# Patient Record
Sex: Female | Born: 1985
Health system: Southern US, Community
[De-identification: ages and names within clinical notes are randomized; demographics above are authoritative.]

## PROBLEM LIST (undated history)

## (undated) ENCOUNTER — Inpatient Hospital Stay (HOSPITAL_COMMUNITY): Payer: Self-pay

## (undated) DIAGNOSIS — Z8719 Personal history of other diseases of the digestive system: Secondary | ICD-10-CM

## (undated) DIAGNOSIS — N390 Urinary tract infection, site not specified: Secondary | ICD-10-CM

## (undated) DIAGNOSIS — Z634 Disappearance and death of family member: Secondary | ICD-10-CM

## (undated) DIAGNOSIS — M419 Scoliosis, unspecified: Secondary | ICD-10-CM

## (undated) DIAGNOSIS — K219 Gastro-esophageal reflux disease without esophagitis: Secondary | ICD-10-CM

## (undated) DIAGNOSIS — T7840XA Allergy, unspecified, initial encounter: Secondary | ICD-10-CM

## (undated) DIAGNOSIS — M1990 Unspecified osteoarthritis, unspecified site: Secondary | ICD-10-CM

## (undated) DIAGNOSIS — R51 Headache: Secondary | ICD-10-CM

## (undated) DIAGNOSIS — R55 Syncope and collapse: Secondary | ICD-10-CM

## (undated) DIAGNOSIS — G709 Myoneural disorder, unspecified: Secondary | ICD-10-CM

## (undated) DIAGNOSIS — I951 Orthostatic hypotension: Secondary | ICD-10-CM

## (undated) HISTORY — DX: Myoneural disorder, unspecified: G70.9

## (undated) HISTORY — DX: Syncope and collapse: R55

## (undated) HISTORY — DX: Orthostatic hypotension: I95.1

## (undated) HISTORY — DX: Unspecified osteoarthritis, unspecified site: M19.90

## (undated) HISTORY — DX: Allergy, unspecified, initial encounter: T78.40XA

## (undated) HISTORY — DX: Scoliosis, unspecified: M41.9

---
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
## 2007-01-09 ENCOUNTER — Emergency Department (HOSPITAL_COMMUNITY): Admission: EM | Admit: 2007-01-09 | Discharge: 2007-01-09 | Payer: Self-pay | Admitting: Family Medicine

## 2009-04-20 ENCOUNTER — Emergency Department (HOSPITAL_COMMUNITY): Admission: EM | Admit: 2009-04-20 | Discharge: 2009-04-20 | Payer: Self-pay | Admitting: Emergency Medicine

## 2009-11-01 ENCOUNTER — Emergency Department (HOSPITAL_COMMUNITY): Admission: EM | Admit: 2009-11-01 | Discharge: 2009-11-01 | Payer: Self-pay | Admitting: Family Medicine

## 2009-12-07 ENCOUNTER — Emergency Department (HOSPITAL_COMMUNITY): Admission: EM | Admit: 2009-12-07 | Discharge: 2009-12-07 | Payer: Self-pay | Admitting: Family Medicine

## 2010-01-07 ENCOUNTER — Emergency Department (HOSPITAL_COMMUNITY): Admission: EM | Admit: 2010-01-07 | Discharge: 2010-01-07 | Payer: Self-pay | Admitting: Emergency Medicine

## 2010-01-07 ENCOUNTER — Emergency Department (HOSPITAL_COMMUNITY): Admission: EM | Admit: 2010-01-07 | Discharge: 2010-01-08 | Payer: Self-pay | Admitting: Emergency Medicine

## 2010-01-08 ENCOUNTER — Ambulatory Visit (HOSPITAL_COMMUNITY): Admission: RE | Admit: 2010-01-08 | Discharge: 2010-01-08 | Payer: Self-pay | Admitting: Otolaryngology

## 2010-03-02 ENCOUNTER — Emergency Department (HOSPITAL_COMMUNITY): Admission: EM | Admit: 2010-03-02 | Discharge: 2010-03-02 | Payer: Self-pay | Admitting: Family Medicine

## 2010-07-01 ENCOUNTER — Emergency Department (HOSPITAL_COMMUNITY)
Admission: EM | Admit: 2010-07-01 | Discharge: 2010-07-01 | Payer: Self-pay | Source: Home / Self Care | Admitting: Emergency Medicine

## 2010-07-05 HISTORY — PX: NOSE SURGERY: SHX723

## 2010-09-14 LAB — POCT URINALYSIS DIPSTICK
Bilirubin Urine: NEGATIVE
Glucose, UA: NEGATIVE mg/dL
Ketones, ur: NEGATIVE mg/dL
Nitrite: NEGATIVE
Protein, ur: 100 mg/dL — AB
Specific Gravity, Urine: 1.02 (ref 1.005–1.030)
Urobilinogen, UA: 0.2 mg/dL (ref 0.0–1.0)
pH: 8.5 — ABNORMAL HIGH (ref 5.0–8.0)

## 2010-09-14 LAB — URINE CULTURE

## 2010-09-14 LAB — POCT PREGNANCY, URINE: Preg Test, Ur: NEGATIVE

## 2010-09-17 LAB — POCT URINALYSIS DIPSTICK
Protein, ur: >=300 mg/dL — AB
Specific Gravity, Urine: >=1.03 (ref 1.005–1.030)
Urobilinogen, UA: 0.2 mg/dL (ref 0.0–1.0)

## 2010-09-17 LAB — GC/CHLAMYDIA PROBE AMP, GENITAL: Chlamydia, DNA Probe: NEGATIVE

## 2010-09-17 LAB — WET PREP, GENITAL
Trich, Wet Prep: NONE SEEN
Yeast Wet Prep HPF POC: NONE SEEN

## 2010-09-20 LAB — CBC
HCT: 40.5 % (ref 36.0–46.0)
MCHC: 32.9 g/dL (ref 30.0–36.0)
MCV: 80.8 fL (ref 78.0–100.0)
RDW: 13.2 % (ref 11.5–15.5)

## 2010-09-21 LAB — POCT URINALYSIS DIP (DEVICE)
Ketones, ur: NEGATIVE mg/dL
Protein, ur: 30 mg/dL — AB
Specific Gravity, Urine: 1.025 (ref 1.005–1.030)
pH: 5.5 (ref 5.0–8.0)

## 2010-09-21 LAB — GC/CHLAMYDIA PROBE AMP, GENITAL
Chlamydia, DNA Probe: NEGATIVE
GC Probe Amp, Genital: NEGATIVE

## 2010-09-21 LAB — URINE CULTURE: Colony Count: 75000

## 2010-09-21 LAB — WET PREP, GENITAL

## 2010-10-08 LAB — CULTURE, ROUTINE-ABSCESS

## 2010-11-08 ENCOUNTER — Emergency Department (HOSPITAL_COMMUNITY)
Admission: EM | Admit: 2010-11-08 | Discharge: 2010-11-08 | Disposition: A | Discharge status: Home / Self Care | Payer: Self-pay | Attending: Emergency Medicine | Admitting: Emergency Medicine

## 2010-11-08 DIAGNOSIS — N898 Other specified noninflammatory disorders of vagina: Secondary | ICD-10-CM | POA: Insufficient documentation

## 2010-11-08 DIAGNOSIS — R339 Retention of urine, unspecified: Secondary | ICD-10-CM | POA: Insufficient documentation

## 2010-11-08 DIAGNOSIS — M549 Dorsalgia, unspecified: Secondary | ICD-10-CM | POA: Insufficient documentation

## 2010-11-08 DIAGNOSIS — Z87440 Personal history of urinary (tract) infections: Secondary | ICD-10-CM | POA: Insufficient documentation

## 2010-11-08 DIAGNOSIS — K219 Gastro-esophageal reflux disease without esophagitis: Secondary | ICD-10-CM | POA: Insufficient documentation

## 2010-11-08 DIAGNOSIS — R109 Unspecified abdominal pain: Secondary | ICD-10-CM | POA: Insufficient documentation

## 2010-11-08 DIAGNOSIS — R350 Frequency of micturition: Secondary | ICD-10-CM | POA: Insufficient documentation

## 2010-11-08 DIAGNOSIS — R3915 Urgency of urination: Secondary | ICD-10-CM | POA: Insufficient documentation

## 2010-11-08 LAB — URINALYSIS, ROUTINE W REFLEX MICROSCOPIC
Bilirubin Urine: NEGATIVE
Ketones, ur: NEGATIVE mg/dL
Nitrite: NEGATIVE
pH: 7 (ref 5.0–8.0)

## 2010-11-08 LAB — WET PREP, GENITAL
Clue Cells Wet Prep HPF POC: NONE SEEN
Trich, Wet Prep: NONE SEEN

## 2010-11-08 LAB — POCT PREGNANCY, URINE: Preg Test, Ur: NEGATIVE

## 2010-11-09 LAB — GC/CHLAMYDIA PROBE AMP, GENITAL
Chlamydia, DNA Probe: NEGATIVE
GC Probe Amp, Genital: NEGATIVE

## 2010-11-20 NOTE — H&P (Signed)
Elizabeth Burke             ACCOUNT NO.:  [id number]   MEDICAL RECORD NO.:  [id number]          PATIENT TYPE:  INP   LOCATION:                                FACILITY:  [facility]   PHYSICIAN:  Elizabeth Burke, M.D.   DATE OF BIRTH:  1986-04-14   DATE OF ADMISSION:  DATE OF DISCHARGE:                                HISTORY & PHYSICAL   HISTORY OF PRESENT ILLNESS:  This a 25 year old gravida 1 para 0 0 0 0 at 46-  1/7 weeks who presents to the office for evaluation of labor.  Upon exam her  cervix is found to be 6 cm dilated, completely effaced, minus 1 station,  with a vertex presentation and decision is made to send her to birthing  suite for delivery.  She denies leaking or bleeding, reports positive fetal  movement.  Pregnancy has been followed by Elizabeth Burke and remarkable for:  1. GERD.  2. Teen.  3. Multiple STD history.  4. Group B strep bacteruria.   PRENATAL LABS:  Hemoglobin 12.3, platelets 299, blood type B positive,  antibody screen negative, sickle cell negative, RPR was nonreactive, Rubella  immune, hepatitis negative, HIV negative, Aptest normal.  Initial GC and  chlamydia were positive, test of cure after treatment were both negative.   IV HISTORY:  The patient is primigravida.   ALLERGIES:  None.   MEDICAL HISTORY:  Remarkable for first trimester STDs and subsequent other  episodes, history of acid reflux, history of UTI in 2006, history of broken  wrist as a child.   FAMILY HISTORY:  Family history is remarkable for grandmother with  hypertension and varicosities and diabetes, aunt with hypothyroidism,  grandfather with prostate cancer, uncle with prostate cancer.   SURGICAL HISTORY:  Negative.   GENETIC HISTORY:  Remarkable for uncle and cousin with twins.  Father of the  baby's info in  unknown.   SOCIAL HISTORY:  The patient is single, father of the baby is intermittently  involved (questionable).  The patient's mother is with her today.  The  patient  is a Consulting civil engineer.  She does not report a religious affiliation.  She  denies any alcohol, tobacco or drug use.   HISTORY OF CURRENT PREGNANCY:  The patient entered care at [redacted] weeks  gestation.  Test of cure was done following treatment for first trimester GC  and chlamydia and test of cure was negative.  The patient was found to be a  cystic fibrosis carrier and was supposed to notify the father of the baby to  be tested.  She had an ultrasound at 20 weeks which was normal.  She  measured size greater than dates at 27 weeks, had another ultrasound at 28  weeks, measured 69th percentile with normal AFI.  Gluco level was normal.  Father of the baby was never tested for cystic fibrosis, and then she  presents for labor.   OBJECTIVE DATA:  Vital signs stable, afebrile.  HEENT:  Within normal limits.  Thyroid normal, not enlarged.  CHEST:  Clear to auscultation.  HEART:  Regular rate and rhythm.  ABDOMEN:  Gravid, 39 cm, vertex.  Fetal heart rate was 150 BPM.  Cervix is 6  cm, completely effaced, minus 1 station with a vertex presentation, no  leaking of membranes is noted.  EXTREMITIES:  Within normal limits.   ASSESSMENT:  1. Intrauterine pregnancy at 39-1/7 weeks.  2. Active labor.  3. Group B strep positive.   PLAN:  1. Admit to birthing suites per Dr. Su Burke.  2. Routine MD orders and further notes to follow.      Elizabeth Burke, C.N.M.      Elizabeth Burke, M.D.  Electronically Signed    MLW/MEDQ  D:  03/01/2006  T:  03/01/2006  Job:  [id number]

## 2011-01-19 ENCOUNTER — Inpatient Hospital Stay (INDEPENDENT_AMBULATORY_CARE_PROVIDER_SITE_OTHER)
Admission: RE | Admit: 2011-01-19 | Discharge: 2011-01-19 | Disposition: A | Discharge status: Home / Self Care | Payer: 59 | Source: Ambulatory Visit | Attending: Family Medicine | Admitting: Family Medicine

## 2011-01-19 DIAGNOSIS — N760 Acute vaginitis: Secondary | ICD-10-CM

## 2011-01-19 DIAGNOSIS — L738 Other specified follicular disorders: Secondary | ICD-10-CM

## 2011-01-19 LAB — POCT URINALYSIS DIP (DEVICE)
Glucose, UA: NEGATIVE mg/dL
Ketones, ur: NEGATIVE mg/dL
Leukocytes, UA: NEGATIVE
Nitrite: NEGATIVE
Nitrite: NEGATIVE
Protein, ur: 30 mg/dL — AB
Urobilinogen, UA: 0.2 mg/dL (ref 0.0–1.0)
Urobilinogen, UA: 0.2 mg/dL (ref 0.0–1.0)

## 2011-01-19 LAB — WET PREP, GENITAL: Yeast Wet Prep HPF POC: NONE SEEN

## 2011-01-19 LAB — POCT PREGNANCY, URINE: Preg Test, Ur: NEGATIVE

## 2011-01-20 LAB — HSV 2 ANTIBODY, IGG: HSV 2 Glycoprotein G Ab, IgG: 0.12 IV

## 2011-04-20 LAB — GC/CHLAMYDIA PROBE AMP, GENITAL: GC Probe Amp, Genital: NEGATIVE

## 2011-04-22 LAB — POCT RAPID STREP A: Streptococcus, Group A Screen (Direct): NEGATIVE

## 2011-04-22 LAB — POCT INFECTIOUS MONO SCREEN: Mono Screen: NEGATIVE

## 2011-06-30 ENCOUNTER — Emergency Department (HOSPITAL_COMMUNITY)
Admission: EM | Admit: 2011-06-30 | Discharge: 2011-06-30 | Disposition: A | Discharge status: Home / Self Care | Payer: 59 | Source: Home / Self Care | Attending: Family Medicine | Admitting: Family Medicine

## 2011-06-30 ENCOUNTER — Encounter: Payer: Self-pay | Admitting: Emergency Medicine

## 2011-06-30 DIAGNOSIS — J111 Influenza due to unidentified influenza virus with other respiratory manifestations: Secondary | ICD-10-CM

## 2011-06-30 NOTE — ED Provider Notes (Signed)
Medical screening examination/treatment/procedure(s) were performed by non-physician practitioner and as supervising physician I was immediately available for consultation/collaboration.   Barkley Bruns MD.    Barkley Bruns, MD 06/30/11 2101

## 2011-06-30 NOTE — ED Provider Notes (Signed)
25 year old woman with one day of fever nausea chills headache bodyaches sore throat without dyspnea. She did not get a flu shot this year she has positive sick contacts. She hasn't tried any medications yet.  PMH reviewed.  ROS as above otherwise neg Medications reviewed. (none)  Exam:  BP 114/80  Pulse 104  Temp(Src) 100.3 F (37.9 C) (Oral)  Resp 18  SpO2 97%  LMP 06/03/2011 Gen: Well NAD HEENT: EOMI,  MMM, normal posterior pharynx Lungs: CTABL Nl WOB Heart: RRR no MRG Abd: NABS, NT, ND Exts: Non edematous BL  LE, warm and well perfused.   Assessment and plan: 25 year old woman with influenza-like illness. Plan supportive therapy with Tylenol or ibuprofen. Handout and influenza given and red flags reviewed with patient who expresses understanding.   Clementeen Graham 06/30/11 1901

## 2011-06-30 NOTE — ED Notes (Signed)
PT HERE WITH SUDDEN ONSET FLU LIKE SX WITH FEVER THAT STARTED TODAY.AT HOME TEMP 102.5 RELIEVED BY TYLENOL AND SX BODY ACHES,CHILLS AND NAUSEA.NO VOMITING OR DIARRHEA REPORTED WITH SX

## 2011-09-09 ENCOUNTER — Encounter (HOSPITAL_COMMUNITY): Payer: Self-pay

## 2011-09-09 ENCOUNTER — Emergency Department (INDEPENDENT_AMBULATORY_CARE_PROVIDER_SITE_OTHER)
Admission: EM | Admit: 2011-09-09 | Discharge: 2011-09-09 | Disposition: A | Discharge status: Home / Self Care | Payer: 59 | Source: Home / Self Care

## 2011-09-09 DIAGNOSIS — S46911A Strain of unspecified muscle, fascia and tendon at shoulder and upper arm level, right arm, initial encounter: Secondary | ICD-10-CM

## 2011-09-09 DIAGNOSIS — IMO0002 Reserved for concepts with insufficient information to code with codable children: Secondary | ICD-10-CM

## 2011-09-09 MED ORDER — IBUPROFEN 800 MG PO TABS: 800.0000 mg | ORAL_TABLET | Freq: Three times a day (TID) | ORAL | Status: AC

## 2011-09-09 NOTE — Discharge Instructions (Signed)
Ice your shoulder 3-4 times times a day. If your shoulder pain is not improving on Monday follow up with Dr Renae Gloss.

## 2011-09-09 NOTE — ED Provider Notes (Signed)
History     CSN: [id number]  Arrival date & time 09/09/11  0949   None     Chief Complaint  Patient presents with  . Shoulder Pain    (Consider location/radiation/quality/duration/timing/severity/associated sxs/prior treatment) HPI Comments: Elizabeth Burke presents today with complaints of right shoulder pain for one week. She states her pain significantly worsened yesterday. Pain worsens with movement of her shoulder. She denies injury, but states that she does lift heavy boxes at work. She has had intermittent pain in bilateral shoulders off and on for a few months but this has been mild and never this severe. She has tried Tylenol for her pain without improvement.   History reviewed. No pertinent past medical history.  Past Surgical History  Procedure Date  . Nose surgery     History reviewed. No pertinent family history.  History  Substance Use Topics  . Smoking status: Current Everyday Smoker -- 1.0 packs/day  . Smokeless tobacco: Not on file  . Alcohol Use: No    OB History    Grav Para Term Preterm Abortions TAB SAB Ect Mult Living                  Review of Systems  Constitutional: Negative for fever.  Musculoskeletal: Negative for joint swelling.  Skin: Negative for color change.  Neurological: Negative for weakness and numbness.    Allergies  Review of patient's allergies indicates no known allergies.  Home Medications   Current Outpatient Rx  Name Route Sig Dispense Refill  . IBUPROFEN 800 MG PO TABS Oral Take 1 tablet (800 mg total) by mouth 3 (three) times daily. 15 tablet 0    BP 136/90  Pulse 70  Temp(Src) 97.7 F (36.5 C) (Oral)  Resp 16  SpO2 100%  LMP 08/19/2011  Physical Exam  Nursing note and vitals reviewed. Constitutional: She appears well-developed and well-nourished. No distress.  Cardiovascular:  Pulses:      Radial pulses are 2+ on the right side.  Musculoskeletal:       Right shoulder: She exhibits decreased range of motion,  tenderness and decreased strength. She exhibits no bony tenderness, no swelling, no effusion, no crepitus, no deformity and normal pulse.       Rt Shoulder: Decreased active abduction at 45. Decreased passive abduction at 90. Tender to palpation the superior half of the deltoid muscle. Remainder of shoulder is nontender to palpation.  Neurological: She is alert.  Skin: Skin is warm and dry. No erythema.  Psychiatric: She has a normal mood and affect.    ED Course  Procedures (including critical care time)  Labs Reviewed - No data to display No results found.   1. Right shoulder strain       MDM  Rt deltoid strain. Treatment with ice and Ibuprofen. F/u with PCP if symptoms are not improving next week.        Melody Comas, [place] 09/09/11 1232

## 2011-09-09 NOTE — ED Notes (Signed)
C/o pain in rt shoulder for 1 week, pain became severe yesterday.  Pain is worse with movement.  Denies known injury

## 2011-09-10 NOTE — ED Provider Notes (Signed)
Medical screening examination/treatment/procedure(s) were performed by non-physician practitioner and as supervising physician I was immediately available for consultation/collaboration.  Leslee Home, M.D.   Reuben Likes, MD 09/10/11 1345

## 2011-10-16 ENCOUNTER — Emergency Department (INDEPENDENT_AMBULATORY_CARE_PROVIDER_SITE_OTHER)
Admission: EM | Admit: 2011-10-16 | Discharge: 2011-10-16 | Disposition: A | Discharge status: Home / Self Care | Payer: 59 | Source: Home / Self Care

## 2011-10-16 ENCOUNTER — Encounter (HOSPITAL_COMMUNITY): Payer: Self-pay | Admitting: Emergency Medicine

## 2011-10-16 DIAGNOSIS — N3090 Cystitis, unspecified without hematuria: Secondary | ICD-10-CM

## 2011-10-16 HISTORY — DX: Urinary tract infection, site not specified: N39.0

## 2011-10-16 LAB — POCT URINALYSIS DIP (DEVICE)
Glucose, UA: NEGATIVE mg/dL
Ketones, ur: NEGATIVE mg/dL
Specific Gravity, Urine: 1.02 (ref 1.005–1.030)
Urobilinogen, UA: 0.2 mg/dL (ref 0.0–1.0)

## 2011-10-16 LAB — POCT PREGNANCY, URINE: Preg Test, Ur: NEGATIVE

## 2011-10-16 MED ORDER — CEPHALEXIN 500 MG PO CAPS: 500.0000 mg | ORAL_CAPSULE | Freq: Two times a day (BID) | ORAL | Status: AC

## 2011-10-16 NOTE — ED Provider Notes (Signed)
History     CSN: [id number]  Arrival date & time 10/16/11  1041   None     Chief Complaint  Patient presents with  . Hematuria    (Consider location/radiation/quality/duration/timing/severity/associated sxs/prior treatment) HPI Comments: Patient presents today with complaints of and hematuria, urinary frequency and dysuria at the end of stream. Symptoms began this morning. She admits to mild suprapubic tenderness. She has chronic back pain, and nausea unchanged. No vomiting. She denies fever. She is a history of urinary tract infections, and states she was last treated for one couple of months ago.   Past Medical History  Diagnosis Date  . Urinary tract infection     Past Surgical History  Procedure Date  . Nose surgery     History reviewed. No pertinent family history.  History  Substance Use Topics  . Smoking status: Current Everyday Smoker -- 1.0 packs/day  . Smokeless tobacco: Not on file  . Alcohol Use: Yes    OB History    Grav Para Term Preterm Abortions TAB SAB Ect Mult Living                  Review of Systems  Constitutional: Negative for fever and chills.  Gastrointestinal: Positive for abdominal pain. Negative for nausea and vomiting.  Genitourinary: Positive for dysuria, frequency and hematuria. Negative for flank pain.    Allergies  Review of patient's allergies indicates no known allergies.  Home Medications   Current Outpatient Rx  Name Route Sig Dispense Refill  . CEPHALEXIN 500 MG PO CAPS Oral Take 1 capsule (500 mg total) by mouth 2 (two) times daily. 14 capsule 0    BP 117/75  Pulse 85  Temp(Src) 98.2 F (36.8 C) (Oral)  Resp 17  SpO2 99%  LMP 09/27/2011  Physical Exam  Nursing note and vitals reviewed. Constitutional: She appears well-developed and well-nourished. No distress.  HENT:  Head: Normocephalic and atraumatic.  Cardiovascular: Normal rate, regular rhythm and normal heart sounds.   Pulmonary/Chest: Effort normal  and breath sounds normal. No respiratory distress.  Abdominal: Soft. Normal appearance and bowel sounds are normal. She exhibits no distension and no mass. There is no hepatosplenomegaly. There is tenderness in the suprapubic area. There is no guarding and no CVA tenderness.  Neurological: She is alert.  Skin: Skin is warm and dry.  Psychiatric: She has a normal mood and affect.    ED Course  Procedures (including critical care time)  Labs Reviewed  POCT URINALYSIS DIP (DEVICE) - Abnormal; Notable for the following:    Hgb urine dipstick LARGE (*)    pH 8.5 (*)    Protein, ur 100 (*)    Leukocytes, UA MODERATE (*) Biochemical Testing Only. Please order routine urinalysis from main lab if confirmatory testing is needed.   All other components within normal limits  POCT PREGNANCY, URINE   No results found.   1. Cystitis       MDM  UA pos for UTI.         Melody Comas, [place] 10/16/11 1323

## 2011-10-16 NOTE — ED Provider Notes (Signed)
Medical screening examination/treatment/procedure(s) were performed by non-physician practitioner and as supervising physician I was immediately available for consultation/collaboration.  Leslee Home, M.D.   Reuben Likes, MD 10/16/11 2036

## 2011-10-16 NOTE — ED Notes (Signed)
Noticed blood in urine this am.  Noticed odd sensation with urination yesterday. Today has frequent urination, pain with urination, particularly at the end of the stream.  Low abdominal pain onset last week.  Back  Pain is chronic, no difference from usual.

## 2011-10-16 NOTE — Discharge Instructions (Signed)
Increase fluids. Water and cranberry juice are good. Take antibiotics as prescribed. Return if symptoms change or worsen, or if no improvement in 2-3 days.  Urinary Tract Infection A urinary tract infection (UTI) is often caused by a germ (bacteria). A UTI is usually helped with medicine (antibiotics) that kills germs. Take all the medicine until it is gone. Do this even if you are feeling better. You are usually better in 7 to 10 days. HOME CARE   Drink enough water and fluids to keep your pee (urine) clear or pale yellow. Drink:   Cranberry juice.   Water.   Avoid:   Caffeine.   Tea.   Bubbly (carbonated) drinks.   Alcohol.   Only take medicine as told by your doctor.   To prevent further infections:   Pee often.   After pooping (bowel movement), women should wipe from front to back. Use each tissue only once.   Pee before and after having sex (intercourse).  Ask your doctor when your test results will be ready. Make sure you follow up and get your test results.  GET HELP RIGHT AWAY IF:   There is very bad back pain or lower belly (abdominal) pain.   You get the chills.   You have a fever.   Your baby is older than 3 months with a rectal temperature of 102 F (38.9 C) or higher.   Your baby is 5 months old or younger with a rectal temperature of 100.4 F (38 C) or higher.   You feel sick to your stomach (nauseous) or throw up (vomit).   There is continued burning with peeing.   Your problems are not better in 3 days. Return sooner if you are getting worse.  MAKE SURE YOU:   Understand these instructions.   Will watch your condition.   Will get help right away if you are not doing well or get worse.  Document Released: 12/08/2007 Document Revised: 06/10/2011 Document Reviewed: 12/08/2007 [employer] Patient Information 2012 [place], [place].

## 2011-11-10 ENCOUNTER — Emergency Department (HOSPITAL_COMMUNITY)
Admission: EM | Admit: 2011-11-10 | Discharge: 2011-11-10 | Disposition: A | Discharge status: Home / Self Care | Payer: 59 | Attending: Emergency Medicine | Admitting: Emergency Medicine

## 2011-11-10 ENCOUNTER — Encounter (HOSPITAL_COMMUNITY): Payer: Self-pay | Admitting: *Deleted

## 2011-11-10 DIAGNOSIS — F17200 Nicotine dependence, unspecified, uncomplicated: Secondary | ICD-10-CM | POA: Insufficient documentation

## 2011-11-10 DIAGNOSIS — R42 Dizziness and giddiness: Secondary | ICD-10-CM | POA: Insufficient documentation

## 2011-11-10 DIAGNOSIS — R404 Transient alteration of awareness: Secondary | ICD-10-CM | POA: Insufficient documentation

## 2011-11-10 DIAGNOSIS — E869 Volume depletion, unspecified: Secondary | ICD-10-CM | POA: Insufficient documentation

## 2011-11-10 DIAGNOSIS — R55 Syncope and collapse: Secondary | ICD-10-CM

## 2011-11-10 LAB — POCT I-STAT, CHEM 8
Calcium, Ion: 1.15 mmol/L (ref 1.12–1.32)
Chloride: 105 mEq/L (ref 96–112)
HCT: 40 % (ref 36.0–46.0)
Hemoglobin: 13.6 g/dL (ref 12.0–15.0)
Potassium: 3.8 mEq/L (ref 3.5–5.1)

## 2011-11-10 LAB — PREGNANCY, URINE: Preg Test, Ur: NEGATIVE

## 2011-11-10 MED ORDER — SODIUM CHLORIDE 0.9 % IV BOLUS (SEPSIS)
1000.0000 mL | Freq: Once | INTRAVENOUS | Status: AC
  Administered 2011-11-10: 1000 mL via INTRAVENOUS

## 2011-11-10 MED ORDER — MORPHINE SULFATE 4 MG/ML IJ SOLN
6.0000 mg | Freq: Once | INTRAMUSCULAR | Status: AC
  Administered 2011-11-10: 6 mg via INTRAVENOUS
  Filled 2011-11-10: qty 2

## 2011-11-10 NOTE — ED Provider Notes (Signed)
History     CSN: [id number]  Arrival date & time 11/10/11  1836   First MD Initiated Contact with Patient 11/10/11 1959      Chief Complaint  Patient presents with  . Loss of Consciousness    (Consider location/radiation/quality/duration/timing/severity/associated sxs/prior treatment) The history is provided by the patient.   the patient reports she's been lightheaded when she stands up over the past several days.  She's been feeling generally weak.  She denies chest pain shortness of breath.  She's had no palpitations.  She denies heavy vaginal bleeding.  She's had no melena or hematochezia.  She denies hematemesis.  She denies fevers and chills.  She's had no abdominal pain.  She reports she was lying down with her child and then the next thing she knew him as was her.  She had no urinary incontinence.  She had no tongue biting.  There is no seizure activity reported.  She has no past medical history.  No history of seizures.  She does smoke cigarettes and uses marijuana  Past Medical History  Diagnosis Date  . Urinary tract infection     Past Surgical History  Procedure Date  . Nose surgery     History reviewed. No pertinent family history.  History  Substance Use Topics  . Smoking status: Current Everyday Smoker -- 1.0 packs/day  . Smokeless tobacco: Not on file  . Alcohol Use: Yes    OB History    Grav Para Term Preterm Abortions TAB SAB Ect Mult Living                  Review of Systems  All other systems reviewed and are negative.    Allergies  Review of patient's allergies indicates no known allergies.  Home Medications  No current outpatient prescriptions on file.  BP 130/71  Pulse 68  Temp(Src) 98.9 F (37.2 C) (Oral)  Resp 12  SpO2 100%  LMP 10/17/2011  Physical Exam  Nursing note and vitals reviewed. Constitutional: She is oriented to person, place, and time. She appears well-developed and well-nourished. No distress.  HENT:  Head:  Normocephalic and atraumatic.       Mucous membranes dry  Eyes: EOM are normal.  Neck: Normal range of motion.  Cardiovascular: Normal rate, regular rhythm and normal heart sounds.   Pulmonary/Chest: Effort normal and breath sounds normal.  Abdominal: Soft. She exhibits no distension. There is no tenderness.  Musculoskeletal: Normal range of motion.  Neurological: She is alert and oriented to person, place, and time.  Skin: Skin is warm and dry.  Psychiatric: She has a normal mood and affect. Judgment normal.    ED Course  Procedures (including critical care time)   Date: 11/10/2011  Rate: 71  Rhythm: normal sinus rhythm  QRS Axis: normal  Intervals: normal  ST/T Wave abnormalities: early repolarization  Conduction Disutrbances: none  Narrative Interpretation:   Old EKG Reviewed: No prior ecg available     Labs Reviewed  PREGNANCY, URINE  POCT I-STAT, CHEM 8   No results found.   1. Syncope   2. Volume depletion       MDM  The patient feels much better after IV fluids.  Her EKG and labs are normal.  Unclear etiology.  This does not sound like a seizure.  She did have the appearance of volume depletion on arrival.  Her hemoglobin is normal.         Lyanne Co, MD 11/10/11 [phone]

## 2011-11-10 NOTE — ED Notes (Signed)
[id number]<AV> Expected date:<BR> Expected time: 6:33 PM<BR> Means of arrival:<BR> Comments:<BR> M70 - 25yoF Syncope, now CAOx4

## 2011-11-10 NOTE — ED Notes (Signed)
Pt was found laying down on a "bean bag in room" Pt thought she was getting up to BR. Pt was unresponsive upon EMS arrival. Pt now c/o HA.

## 2011-11-10 NOTE — Discharge Instructions (Signed)
Dehydration, Adult Dehydration is when you lose more fluids from the body than you take in. Vital organs like the kidneys, brain, and heart cannot function without a proper amount of fluids and salt. Any loss of fluids from the body can cause dehydration.  CAUSES   Vomiting.   Diarrhea.   Excessive sweating.   Excessive urine output.   Fever.  SYMPTOMS  Mild dehydration  Thirst.   Dry lips.   Slightly dry mouth.  Moderate dehydration  Very dry mouth.   Sunken eyes.   Skin does not bounce back quickly when lightly pinched and released.   Dark urine and decreased urine production.   Decreased tear production.   Headache.  Severe dehydration  Very dry mouth.   Extreme thirst.   Rapid, weak pulse (more than 100 beats per minute at rest).   Cold hands and feet.   Not able to sweat in spite of heat and temperature.   Rapid breathing.   Blue lips.   Confusion and lethargy.   Difficulty being awakened.   Minimal urine production.   No tears.  DIAGNOSIS  Your caregiver will diagnose dehydration based on your symptoms and your exam. Blood and urine tests will help confirm the diagnosis. The diagnostic evaluation should also identify the cause of dehydration. TREATMENT  Treatment of mild or moderate dehydration can often be done at home by increasing the amount of fluids that you drink. It is best to drink small amounts of fluid more often. Drinking too much at one time can make vomiting worse. Refer to the home care instructions below. Severe dehydration needs to be treated at the hospital where you will probably be given intravenous (IV) fluids that contain water and electrolytes. HOME CARE INSTRUCTIONS   Ask your caregiver about specific rehydration instructions.   Drink enough fluids to keep your urine clear or pale yellow.   Drink small amounts frequently if you have nausea and vomiting.   Eat as you normally do.   Avoid:   Foods or drinks high in  sugar.   Carbonated drinks.   Juice.   Extremely hot or cold fluids.   Drinks with caffeine.   Fatty, greasy foods.   Alcohol.   Tobacco.   Overeating.   Gelatin desserts.   Wash your hands well to avoid spreading bacteria and viruses.   Only take over-the-counter or prescription medicines for pain, discomfort, or fever as directed by your caregiver.   Ask your caregiver if you should continue all prescribed and over-the-counter medicines.   Keep all follow-up appointments with your caregiver.  SEEK MEDICAL CARE IF:  You have abdominal pain and it increases or stays in one area (localizes).   You have a rash, stiff neck, or severe headache.   You are irritable, sleepy, or difficult to awaken.   You are weak, dizzy, or extremely thirsty.  SEEK IMMEDIATE MEDICAL CARE IF:   You are unable to keep fluids down or you get worse despite treatment.   You have frequent episodes of vomiting or diarrhea.   You have blood or green matter (bile) in your vomit.   You have blood in your stool or your stool looks black and tarry.   You have not urinated in 6 to 8 hours, or you have only urinated a small amount of very dark urine.   You have a fever.   You faint.  MAKE SURE YOU:   Understand these instructions.   Will watch your condition.     Will get help right away if you are not doing well or get worse.  Document Released: 06/21/2005 Document Revised: 06/10/2011 Document Reviewed: 02/08/2011 [employer] Patient Information 2012 [employer]. 

## 2012-01-08 ENCOUNTER — Emergency Department (INDEPENDENT_AMBULATORY_CARE_PROVIDER_SITE_OTHER)
Admission: EM | Admit: 2012-01-08 | Discharge: 2012-01-08 | Disposition: A | Discharge status: Home / Self Care | Payer: 59 | Source: Home / Self Care | Attending: Emergency Medicine | Admitting: Emergency Medicine

## 2012-01-08 ENCOUNTER — Encounter (HOSPITAL_COMMUNITY): Payer: Self-pay | Admitting: *Deleted

## 2012-01-08 DIAGNOSIS — L0390 Cellulitis, unspecified: Secondary | ICD-10-CM

## 2012-01-08 DIAGNOSIS — L0291 Cutaneous abscess, unspecified: Secondary | ICD-10-CM

## 2012-01-08 MED ORDER — TRAMADOL HCL 50 MG PO TABS: 100.0000 mg | ORAL_TABLET | Freq: Three times a day (TID) | ORAL | Status: AC | PRN

## 2012-01-08 MED ORDER — SULFAMETHOXAZOLE-TMP DS 800-160 MG PO TABS: 2.0000 | ORAL_TABLET | Freq: Two times a day (BID) | ORAL | Status: AC

## 2012-01-08 NOTE — ED Notes (Signed)
Abscess right axillary onset x one week pt c/o difficulty raising arm due to pain  - left inner thigh onset x one month -

## 2012-01-08 NOTE — ED Provider Notes (Signed)
Chief Complaint  Patient presents with  . Abscess    History of Present Illness:    The patient is a 26 year old female with a one-month history of an abscess on her left medial thigh that comes and goes and a one-week history of an abscess in her right axilla. The abscess in the right axilla is very painful and it has not drained at all. The abscess on the left medial thigh is now for a painful and it has drained. She denies any fever or chills. She's had no history of diabetes, but does have a history of MRSA.  Review of Systems:  Other than noted above, the patient denies any of the following symptoms: Systemic:  No fever, chills or sweats. Skin:  No rash or itching.  PMFSH:  Past medical history, family history, social history, meds, and allergies were reviewed.  No history of diabetes or prior history of abscesses or MRSA.  Physical Exam:   Vital signs:  BP 115/72  Pulse 88  Temp 98.8 F (37.1 C) (Oral)  Resp 17  SpO2 100%  LMP 12/17/2011 Skin:  There is any 2.5 x 2.5 cm fluctuant, very tender mass in the right axilla. This was not draining any pus. She also has an irregularly shaped to tender area on her left medial thigh with some scarring, scabbing, and some purulent drainage.  Skin exam was otherwise normal.  No rash. Ext:  Distal pulses were full, patient has full ROM of all joints.  Procedure:  Verbal informed consent was obtained.  The patient was informed of the risks and benefits of the procedure and understands and accepts.  Identity of the patient was verified verbally and by wristband.   The abscess area in the right axilla is described above was prepped with Betadine and alcohol and anesthetized with 3 mL of 2% Xylocaine with epinephrine.  Using a #11 scalpel blade, a singe straight incision was made into the area of fluctulence, yielding a large amount of prurulent drainage.  Routine cultures were obtained.  Blunt dissection was used to break up loculations and the  resulting wound cavity was packed with 1/4 inch Iodoform gauze.  A sterile pressure dressing was applied.  Since the abscess on the left medial thigh is already draining and has not for a tender, I elected not to incise this today or to culture it, but she will be treated with antibiotics.  Assessment:  The encounter diagnosis was Abscess.  Plan:   1.  The following meds were prescribed:   New Prescriptions   SULFAMETHOXAZOLE-TRIMETHOPRIM (BACTRIM DS) 800-160 MG PER TABLET    Take 2 tablets by mouth 2 (two) times daily.   TRAMADOL (ULTRAM) 50 MG TABLET    Take 2 tablets (100 mg total) by mouth every 8 (eight) hours as needed for pain.   2.  The patient was instructed in symptomatic care and handouts were given. 3.  The patient was instructed to leave the dressing in place and return again in 48 hours for packing removal.   Reuben Likes, MD 01/08/12 [phone]

## 2012-01-11 ENCOUNTER — Emergency Department (HOSPITAL_COMMUNITY)
Admission: EM | Admit: 2012-01-11 | Discharge: 2012-01-11 | Disposition: A | Discharge status: Home / Self Care | Payer: 59 | Source: Home / Self Care | Attending: Family Medicine | Admitting: Family Medicine

## 2012-01-11 ENCOUNTER — Encounter (HOSPITAL_COMMUNITY): Payer: Self-pay

## 2012-01-11 DIAGNOSIS — Z4801 Encounter for change or removal of surgical wound dressing: Secondary | ICD-10-CM

## 2012-01-11 DIAGNOSIS — L02411 Cutaneous abscess of right axilla: Secondary | ICD-10-CM

## 2012-01-11 LAB — CULTURE, ROUTINE-ABSCESS

## 2012-01-11 NOTE — ED Provider Notes (Signed)
History     CSN: [id number]  Arrival date & time 01/11/12  1134   First MD Initiated Contact with Patient 01/11/12 1316      Chief Complaint  Patient presents with  . Wound Check    (Consider location/radiation/quality/duration/timing/severity/associated sxs/prior treatment) Patient is a 26 y.o. female presenting with wound check. The history is provided by the patient. No language interpreter was used.  Wound Check  She was treated in the ED 2 to 3 days ago. Treatments since wound repair include oral antibiotics. Her temperature was unmeasured prior to arrival. There has been bloody discharge from the wound. The redness has worsened. There is no swelling present. The pain has no pain.   Pt here for packing removal.  Pt complains of continued swelling and pain Past Medical History  Diagnosis Date  . Urinary tract infection     Past Surgical History  Procedure Date  . Nose surgery     History reviewed. No pertinent family history.  History  Substance Use Topics  . Smoking status: Current Everyday Smoker -- 1.0 packs/day  . Smokeless tobacco: Not on file  . Alcohol Use: Yes    OB History    Grav Para Term Preterm Abortions TAB SAB Ect Mult Living                  Review of Systems  Skin: Positive for wound.  All other systems reviewed and are negative.    Allergies  Review of patient's allergies indicates no known allergies.  Home Medications   Current Outpatient Rx  Name Route Sig Dispense Refill  . SULFAMETHOXAZOLE-TMP DS 800-160 MG PO TABS Oral Take 2 tablets by mouth 2 (two) times daily. 40 tablet 0  . TRAMADOL HCL 50 MG PO TABS Oral Take 2 tablets (100 mg total) by mouth every 8 (eight) hours as needed for pain. 30 tablet 0  . BOIL-EASE EX Apply externally Apply topically.      BP 109/67  Pulse 80  Temp 98.4 F (36.9 C) (Oral)  Resp 14  SpO2 97%  LMP 12/17/2011  Physical Exam  Nursing note and vitals reviewed. Constitutional: She is oriented  to person, place, and time. She appears well-developed and well-nourished.  HENT:  Head: Normocephalic.  Musculoskeletal: She exhibits tenderness.       Packing removed from right axilla,  Large amount of drainage,    Neurological: She is alert and oriented to person, place, and time.  Skin: Skin is warm.  Psychiatric: She has a normal mood and affect.    ED Course  Procedures (including critical care time)  Labs Reviewed - No data to display No results found.   No diagnosis found.    MDM  I advised soak area 20 minutes 4 times a day.  Continue antibitics        Lonia Skinner [place], [place] 01/11/12 1331

## 2012-01-11 NOTE — ED Notes (Signed)
Discussed wound care, med compliance

## 2012-01-11 NOTE — ED Notes (Signed)
Wound recheck and poss packing removal

## 2012-02-11 NOTE — ED Provider Notes (Signed)
Medical screening examination/treatment/procedure(s) were performed by resident physician or non-physician practitioner and as supervising physician I was immediately available for consultation/collaboration.   Barkley Bruns MD.    Linna Hoff, MD 02/11/12 [phone]

## 2012-08-08 ENCOUNTER — Emergency Department (HOSPITAL_COMMUNITY)
Admission: EM | Admit: 2012-08-08 | Discharge: 2012-08-08 | Disposition: A | Discharge status: Home / Self Care | Payer: Self-pay | Attending: Emergency Medicine | Admitting: Emergency Medicine

## 2012-08-08 ENCOUNTER — Encounter (HOSPITAL_COMMUNITY): Payer: Self-pay | Admitting: Emergency Medicine

## 2012-08-08 DIAGNOSIS — Z87440 Personal history of urinary (tract) infections: Secondary | ICD-10-CM | POA: Insufficient documentation

## 2012-08-08 DIAGNOSIS — L02419 Cutaneous abscess of limb, unspecified: Secondary | ICD-10-CM

## 2012-08-08 DIAGNOSIS — N72 Inflammatory disease of cervix uteri: Secondary | ICD-10-CM | POA: Insufficient documentation

## 2012-08-08 DIAGNOSIS — R300 Dysuria: Secondary | ICD-10-CM | POA: Insufficient documentation

## 2012-08-08 DIAGNOSIS — R3915 Urgency of urination: Secondary | ICD-10-CM | POA: Insufficient documentation

## 2012-08-08 DIAGNOSIS — F17200 Nicotine dependence, unspecified, uncomplicated: Secondary | ICD-10-CM | POA: Insufficient documentation

## 2012-08-08 DIAGNOSIS — IMO0002 Reserved for concepts with insufficient information to code with codable children: Secondary | ICD-10-CM | POA: Insufficient documentation

## 2012-08-08 LAB — WET PREP, GENITAL
Clue Cells Wet Prep HPF POC: NONE SEEN
Trich, Wet Prep: NONE SEEN
Yeast Wet Prep HPF POC: NONE SEEN

## 2012-08-08 LAB — URINALYSIS, ROUTINE W REFLEX MICROSCOPIC
Bilirubin Urine: NEGATIVE
Glucose, UA: NEGATIVE mg/dL
Hgb urine dipstick: NEGATIVE
Specific Gravity, Urine: 1.021 (ref 1.005–1.030)
Urobilinogen, UA: 0.2 mg/dL (ref 0.0–1.0)

## 2012-08-08 MED ORDER — CEFTRIAXONE SODIUM 250 MG IJ SOLR
250.0000 mg | Freq: Once | INTRAMUSCULAR | Status: AC
  Administered 2012-08-08: 250 mg via INTRAMUSCULAR
  Filled 2012-08-08: qty 250

## 2012-08-08 MED ORDER — HYDROCODONE-ACETAMINOPHEN 5-325 MG PO TABS
1.0000 | ORAL_TABLET | Freq: Once | ORAL | Status: AC
  Administered 2012-08-08: 1 via ORAL
  Filled 2012-08-08: qty 1

## 2012-08-08 MED ORDER — LIDOCAINE HCL (PF) 1 % IJ SOLN
INTRAMUSCULAR | Status: AC
  Administered 2012-08-08: 0.9 mL
  Filled 2012-08-08: qty 5

## 2012-08-08 MED ORDER — AZITHROMYCIN 250 MG PO TABS
1000.0000 mg | ORAL_TABLET | Freq: Once | ORAL | Status: AC
  Administered 2012-08-08: 1000 mg via ORAL
  Filled 2012-08-08: qty 4

## 2012-08-08 MED ORDER — DOXYCYCLINE HYCLATE 100 MG PO CAPS: 100.0000 mg | ORAL_CAPSULE | Freq: Two times a day (BID) | ORAL | Status: DC

## 2012-08-08 MED ORDER — HYDROCODONE-ACETAMINOPHEN 5-325 MG PO TABS: 1.0000 | ORAL_TABLET | ORAL | Status: DC | PRN

## 2012-08-08 NOTE — ED Notes (Signed)
Onset one week ago developed abscess under right arm. Pain worsening overtime. Currently 10/10 achy dull pain.

## 2012-08-08 NOTE — ED Provider Notes (Signed)
History     CSN: [id number]  Arrival date & time 08/08/12  0932   First MD Initiated Contact with Patient 08/08/12 [phone]      Chief Complaint  Patient presents with  . Abscess    (Consider location/radiation/quality/duration/timing/severity/associated sxs/prior treatment) HPI  27 year old female with prior history of abscess to her right armpit presents complaining recurrent abscess. Patient reports for the past week she has developed a gradual onset of pain and swelling to her right armpit which felt similar to prior abscess that she had I&D. Pain is described as an achy dull sensation worsening with palpation or with movement. Onset is gradual, persistent, moderate in severity. She denies fever, chills, chest pain, shortness of breath, or rash. No specific treatment tried. Patient is a smoker.  Pt also c/o have dysuria x 2 weeks.  Having sxs urinary urgency, with burning on urination and also occasional vaginal discharge.  Denies low back pain, hematuria, or rash.  Currently having her menstruation.  Is sexually active with 1 partner, not using protection.  Prior hx of STD.  No fever, chills.     Past Medical History  Diagnosis Date  . Urinary tract infection     Past Surgical History  Procedure Date  . Nose surgery     No family history on file.  History  Substance Use Topics  . Smoking status: Current Every Day Smoker -- 1.0 packs/day  . Smokeless tobacco: Not on file  . Alcohol Use: Yes    OB History    Grav Para Term Preterm Abortions TAB SAB Ect Mult Living                  Review of Systems  Constitutional:       10 Systems reviewed and all are negative for acute change except as noted in the HPI.     Allergies  Review of patient's allergies indicates no known allergies.  Home Medications  No current outpatient prescriptions on file.  BP 130/86  Pulse 99  Temp 97.7 F (36.5 C) (Oral)  Resp 16  SpO2 100%  Physical Exam  Nursing note and vitals  reviewed. Constitutional: She appears well-developed and well-nourished. No distress.  HENT:  Head: Normocephalic and atraumatic.  Eyes: Conjunctivae normal are normal.  Neck: Normal range of motion. Neck supple.  Cardiovascular: Normal rate and regular rhythm.   Pulmonary/Chest: Effort normal and breath sounds normal. She exhibits no tenderness.  Abdominal: Soft. There is no tenderness.  Genitourinary: Uterus normal. There is no rash or lesion on the right labia. There is no rash or lesion on the left labia. Cervix exhibits no motion tenderness and no discharge. Right adnexum displays no mass and no tenderness. Left adnexum displays no mass and no tenderness. No erythema, tenderness or bleeding around the vagina. Vaginal discharge found.       Chaperone present  Lymphadenopathy:       Right: No inguinal adenopathy present.       Left: No inguinal adenopathy present.  Skin:       R axillary fold: moderate induration and fluctuance to armpit, ttp.  No rash.      ED Course  Procedures (including critical care time)  INCISION AND DRAINAGE Performed by: Fayrene Helper Consent: Verbal consent obtained. Risks and benefits: risks, benefits and alternatives were discussed Type: abscess  Body area: R axillary fold  Anesthesia: local infiltration  Incision was made with a scalpel.  Local anesthetic: lidocaine 2% w epinephrine  Anesthetic total:  6 ml  Complexity: complex Blunt dissection to break up loculations  Drainage: purulent  Drainage amount: moderate  Packing material: 1/4 in iodoform gauze  Patient tolerance: Patient tolerated the procedure well with no immediate complications.     Results for orders placed during the hospital encounter of 08/08/12  URINALYSIS, ROUTINE W REFLEX MICROSCOPIC      Component Value Range   Color, Urine YELLOW  YELLOW   APPearance CLEAR  CLEAR   Specific Gravity, Urine 1.021  1.005 - 1.030   pH 8.0  5.0 - 8.0   Glucose, UA NEGATIVE   NEGATIVE mg/dL   Hgb urine dipstick NEGATIVE  NEGATIVE   Bilirubin Urine NEGATIVE  NEGATIVE   Ketones, ur NEGATIVE  NEGATIVE mg/dL   Protein, ur NEGATIVE  NEGATIVE mg/dL   Urobilinogen, UA 0.2  0.0 - 1.0 mg/dL   Nitrite NEGATIVE  NEGATIVE   Leukocytes, UA NEGATIVE  NEGATIVE  WET PREP, GENITAL      Component Value Range   Yeast Wet Prep HPF POC NONE SEEN  NONE SEEN   Trich, Wet Prep NONE SEEN  NONE SEEN   Clue Cells Wet Prep HPF POC NONE SEEN  NONE SEEN   WBC, Wet Prep HPF POC MANY (*) NONE SEEN   No results found.   12:11 PM Pt presents with multiple complaints.  Pt has abscess to R arm pit, successfully I&D by me today.  Packing placed.  Care instruction in cluding warm compress, abx use, and return in 2 days for wound recheck and packing removal.    Also c/o dysuria and vaginal discharge.  No evidence of PID on exam.  Does have many WBC on wet prep.  Will preemptively treat with rocephin/zithromax for suspect STD.  Pt educate to avoid sexual activity until sxs cleared.  To use protection at all time.  Culture sent.    Will d/c with doxy, pain meds and return precaution  BP 130/86  Pulse 99  Temp 97.7 F (36.5 C) (Oral)  Resp 16  SpO2 100%  I have reviewed nursing notes and vital signs. I personally reviewed the imaging tests through PACS system  I reviewed available ER/hospitalization records thought the EMR   1. Abscess, R axillary fold 2. cervicitis  MDM          Fayrene Helper, PA-C 08/08/12 1215  Fayrene Helper, PA-C 08/08/12 1221

## 2012-08-08 NOTE — ED Provider Notes (Signed)
Medical screening examination/treatment/procedure(s) were performed by non-physician practitioner and as supervising physician I was immediately available for consultation/collaboration.   Gerhard Munch, MD 08/08/12 [phone]

## 2012-08-09 LAB — GC/CHLAMYDIA PROBE AMP
CT Probe RNA: NEGATIVE
GC Probe RNA: NEGATIVE

## 2012-08-26 ENCOUNTER — Encounter (HOSPITAL_COMMUNITY): Payer: Self-pay | Admitting: Emergency Medicine

## 2012-08-26 ENCOUNTER — Emergency Department (HOSPITAL_COMMUNITY)
Admission: EM | Admit: 2012-08-26 | Discharge: 2012-08-26 | Disposition: A | Discharge status: Home / Self Care | Payer: Self-pay | Attending: Emergency Medicine | Admitting: Emergency Medicine

## 2012-08-26 DIAGNOSIS — IMO0001 Reserved for inherently not codable concepts without codable children: Secondary | ICD-10-CM | POA: Insufficient documentation

## 2012-08-26 DIAGNOSIS — Z3202 Encounter for pregnancy test, result negative: Secondary | ICD-10-CM | POA: Insufficient documentation

## 2012-08-26 DIAGNOSIS — M79609 Pain in unspecified limb: Secondary | ICD-10-CM | POA: Insufficient documentation

## 2012-08-26 DIAGNOSIS — F17200 Nicotine dependence, unspecified, uncomplicated: Secondary | ICD-10-CM | POA: Insufficient documentation

## 2012-08-26 DIAGNOSIS — Z87440 Personal history of urinary (tract) infections: Secondary | ICD-10-CM | POA: Insufficient documentation

## 2012-08-26 DIAGNOSIS — Z79899 Other long term (current) drug therapy: Secondary | ICD-10-CM | POA: Insufficient documentation

## 2012-08-26 DIAGNOSIS — M7918 Myalgia, other site: Secondary | ICD-10-CM

## 2012-08-26 LAB — URINALYSIS, ROUTINE W REFLEX MICROSCOPIC
Bilirubin Urine: NEGATIVE
Glucose, UA: NEGATIVE mg/dL
Hgb urine dipstick: NEGATIVE
Specific Gravity, Urine: 1.025 (ref 1.005–1.030)
Urobilinogen, UA: 0.2 mg/dL (ref 0.0–1.0)

## 2012-08-26 LAB — POCT PREGNANCY, URINE: Preg Test, Ur: NEGATIVE

## 2012-08-26 MED ORDER — IBUPROFEN 800 MG PO TABS
800.0000 mg | ORAL_TABLET | Freq: Once | ORAL | Status: AC
  Administered 2012-08-26: 800 mg via ORAL
  Filled 2012-08-26: qty 1

## 2012-08-26 MED ORDER — DIAZEPAM 5 MG PO TABS
5.0000 mg | ORAL_TABLET | Freq: Once | ORAL | Status: AC
  Administered 2012-08-26: 5 mg via ORAL
  Filled 2012-08-26: qty 1

## 2012-08-26 MED ORDER — TRAMADOL HCL 50 MG PO TABS: 50.0000 mg | ORAL_TABLET | Freq: Four times a day (QID) | ORAL | Status: DC | PRN

## 2012-08-26 MED ORDER — DIAZEPAM 5 MG PO TABS: 5.0000 mg | ORAL_TABLET | Freq: Four times a day (QID) | ORAL | Status: DC | PRN

## 2012-08-26 NOTE — ED Notes (Signed)
Joni Reining, [place] and PA student at the bedside.

## 2012-08-26 NOTE — ED Provider Notes (Signed)
History     CSN: [id number]  Arrival date & time 08/26/12  1038   First MD Initiated Contact with Patient 08/26/12 1135      Chief Complaint  Patient presents with  . Flank Pain    (Consider location/radiation/quality/duration/timing/severity/associated sxs/prior treatment) HPI  Elizabeth Burke is a 27 y.o. female , otherwise healthy, complaining of left leg pain intermittently over the course last 4 days. Pain is positional she's tried Tylenol and NSAIDs with little relief patient describes her pain as 10 out of 10 at worst but 0/10 when she is not in motion. She denies any dysuria, frequency, fever, nausea vomiting, change in bowel habits, cough.  Past Medical History  Diagnosis Date  . Urinary tract infection     Past Surgical History  Procedure Laterality Date  . Nose surgery      No family history on file.  History  Substance Use Topics  . Smoking status: Current Every Day Smoker -- 1.00 packs/day  . Smokeless tobacco: Not on file  . Alcohol Use: Yes    OB History   Grav Para Term Preterm Abortions TAB SAB Ect Mult Living                  Review of Systems  Constitutional: Negative for fever.  Respiratory: Negative for shortness of breath.   Cardiovascular: Negative for chest pain.  Gastrointestinal: Negative for nausea, vomiting, abdominal pain and diarrhea.  Genitourinary: Positive for flank pain. Negative for dysuria, urgency, frequency, hematuria and difficulty urinating.  All other systems reviewed and are negative.    Allergies  Review of patient's allergies indicates no known allergies.  Home Medications   Current Outpatient Rx  Name  Route  Sig  Dispense  Refill  . diazepam (VALIUM) 5 MG tablet   Oral   Take 1 tablet (5 mg total) by mouth every 6 (six) hours as needed for anxiety (spasms).   10 tablet   0   . doxycycline (VIBRAMYCIN) 100 MG capsule   Oral   Take 1 capsule (100 mg total) by mouth 2 (two) times daily.   20 capsule  0   . HYDROcodone-acetaminophen (NORCO/VICODIN) 5-325 MG per tablet   Oral   Take 1 tablet by mouth every 4 (four) hours as needed for pain.   16 tablet   0   . traMADol (ULTRAM) 50 MG tablet   Oral   Take 1 tablet (50 mg total) by mouth every 6 (six) hours as needed for pain.   15 tablet   0     BP 110/66  Pulse 91  Temp(Src) 98 F (36.7 C) (Oral)  Resp 20  SpO2 100%  LMP 08/05/2012  Physical Exam  Nursing note and vitals reviewed. Constitutional: She is oriented to person, place, and time. She appears well-developed and well-nourished. No distress.  Comfortable laughing  HENT:  Head: Normocephalic and atraumatic.  Right Ear: External ear normal.  Left Ear: External ear normal.  Mouth/Throat: Oropharynx is clear and moist.  Eyes: Conjunctivae and EOM are normal.  Neck: Normal range of motion. Neck supple.  Cardiovascular: Normal rate, regular rhythm, normal heart sounds and intact distal pulses.   Pulmonary/Chest: Effort normal and breath sounds normal. No stridor. No respiratory distress. She has no wheezes. She has no rales. She exhibits no tenderness.  Abdominal: Soft. Bowel sounds are normal. She exhibits no distension and no mass. There is no tenderness. There is no rebound and no guarding.  Musculoskeletal: Normal range  of motion.       Back:  Diffusely tender very light palpation of the right paraspinal musculature in the lower thoracic and lumbar region.  Neurological: She is alert and oriented to person, place, and time.  Psychiatric: She has a normal mood and affect.    ED Course  Procedures (including critical care time)  Labs Reviewed  URINALYSIS, ROUTINE W REFLEX MICROSCOPIC - Abnormal; Notable for the following:    APPearance CLOUDY (*)    All other components within normal limits  POCT PREGNANCY, URINE   No results found.   1. Musculoskeletal pain       MDM  Patient describes her pain as 10 out of 10 but is very comfortable. I do not  think this is high lower kidney stone there is no associated symptoms besides the pain in condition physical exam is suggestive of superficial chest wall issues with her she pulled a muscle.   Pt verbalized understanding and agrees with care plan. Outpatient follow-up and return precautions given.    Discharge Medication List as of 08/26/2012 12:16 PM    START taking these medications   Details  diazepam (VALIUM) 5 MG tablet Take 1 tablet (5 mg total) by mouth every 6 (six) hours as needed for anxiety (spasms)., Starting 08/26/2012, Until Discontinued, Print    traMADol (ULTRAM) 50 MG tablet Take 1 tablet (50 mg total) by mouth every 6 (six) hours as needed for pain., Starting 08/26/2012, Until Discontinued, [employer], PA-C 08/27/12 [phone]

## 2012-08-26 NOTE — ED Notes (Signed)
Pt. Stated, I've been having pain where my kidneys are located for about 4 days.

## 2012-08-27 NOTE — ED Provider Notes (Signed)
Medical screening examination/treatment/procedure(s) were performed by non-physician practitioner and as supervising physician I was immediately available for consultation/collaboration.   Amenah Tucci L Shirlena Brinegar, MD 08/27/12 0853 

## 2013-02-08 ENCOUNTER — Encounter (HOSPITAL_COMMUNITY): Payer: Self-pay | Admitting: Emergency Medicine

## 2013-02-08 DIAGNOSIS — F17200 Nicotine dependence, unspecified, uncomplicated: Secondary | ICD-10-CM | POA: Insufficient documentation

## 2013-02-08 DIAGNOSIS — Z79899 Other long term (current) drug therapy: Secondary | ICD-10-CM | POA: Insufficient documentation

## 2013-02-08 DIAGNOSIS — IMO0002 Reserved for concepts with insufficient information to code with codable children: Secondary | ICD-10-CM | POA: Insufficient documentation

## 2013-02-08 DIAGNOSIS — Z87440 Personal history of urinary (tract) infections: Secondary | ICD-10-CM | POA: Insufficient documentation

## 2013-02-08 NOTE — ED Notes (Signed)
PT. REPORTS PERSISTENT ABSCESS AT RIGHT AXILLA FOR SEVERAL WEEKS WITH DRAINAGE.

## 2013-02-09 ENCOUNTER — Emergency Department (HOSPITAL_COMMUNITY)
Admission: EM | Admit: 2013-02-09 | Discharge: 2013-02-09 | Disposition: A | Discharge status: Home / Self Care | Payer: Self-pay | Attending: Emergency Medicine | Admitting: Emergency Medicine

## 2013-02-09 DIAGNOSIS — L02411 Cutaneous abscess of right axilla: Secondary | ICD-10-CM

## 2013-02-09 MED ORDER — CLINDAMYCIN HCL 300 MG PO CAPS: 300.0000 mg | ORAL_CAPSULE | Freq: Three times a day (TID) | ORAL | Status: DC

## 2013-02-09 MED ORDER — CLINDAMYCIN HCL 150 MG PO CAPS
300.0000 mg | ORAL_CAPSULE | Freq: Once | ORAL | Status: AC
  Administered 2013-02-09: 300 mg via ORAL
  Filled 2013-02-09: qty 2

## 2013-02-09 MED ORDER — OXYCODONE-ACETAMINOPHEN 5-325 MG PO TABS: 1.0000 | ORAL_TABLET | ORAL | Status: DC | PRN

## 2013-02-09 MED ORDER — OXYCODONE-ACETAMINOPHEN 5-325 MG PO TABS
1.0000 | ORAL_TABLET | Freq: Once | ORAL | Status: AC
  Administered 2013-02-09: 1 via ORAL
  Filled 2013-02-09: qty 1

## 2013-02-09 NOTE — ED Provider Notes (Signed)
CSN: [id number]     Arrival date & time 02/08/13  2340 History     None    Chief Complaint  Patient presents with  . Abscess   (Consider location/radiation/quality/duration/timing/severity/associated sxs/prior Treatment) HPI History provided by pt and prior chart.  Pt has a recurrent abscess of right axilla.  Has had multiple I&D's in the past.  Per prior chart, most recent I&D in 08/2012.  Pt reports persistent pain and daily purulent drainage since then.  Pain aggravated by ROM of her arm.  No relief w/ warm compresses.  No associated fever.  Has recurring abscess of groin and L axilla as well.  Has never been diagnosed w/ hydradenitis suppurativa.   Past Medical History  Diagnosis Date  . Urinary tract infection    Past Surgical History  Procedure Laterality Date  . Nose surgery     No family history on file. History  Substance Use Topics  . Smoking status: Current Every Day Smoker -- 1.00 packs/day  . Smokeless tobacco: Not on file  . Alcohol Use: Yes   OB History   Grav Para Term Preterm Abortions TAB SAB Ect Mult Living                 Review of Systems  All other systems reviewed and are negative.    Allergies  Review of patient's allergies indicates no known allergies.  Home Medications   Current Outpatient Rx  Name  Route  Sig  Dispense  Refill  . ibuprofen (ADVIL,MOTRIN) 200 MG tablet   Oral   Take 400 mg by mouth every 6 (six) hours as needed for pain.         . clindamycin (CLEOCIN) 300 MG capsule   Oral   Take 1 capsule (300 mg total) by mouth 3 (three) times daily.   20 capsule   0   . oxyCODONE-acetaminophen (PERCOCET/ROXICET) 5-325 MG per tablet   Oral   Take 1 tablet by mouth every 4 (four) hours as needed for pain.   20 tablet   0    BP 141/94  Pulse 90  Temp(Src) 98.4 F (36.9 C) (Oral)  Resp 18  SpO2 100%  LMP 02/05/2013 Physical Exam  Nursing note and vitals reviewed. Constitutional: She is oriented to person, place, and  time. She appears well-developed and well-nourished. No distress.  HENT:  Head: Normocephalic and atraumatic.  Eyes:  Normal appearance  Neck: Normal range of motion.  Pulmonary/Chest: Effort normal.  Musculoskeletal: Normal range of motion.  Neurological: She is alert and oriented to person, place, and time.  Skin:  Right axilla w/ several abscesses, most of which are very small, the largest 2cm and draining a small amt of purulent fluid.  Cellulitis of entire axilla.  Severely ttp w/ guarding.   Psychiatric: She has a normal mood and affect. Her behavior is normal.    ED Course   Procedures (including critical care time) INCISION AND DRAINAGE Performed by: Ruby Cola E Consent: Verbal consent obtained. Risks and benefits: risks, benefits and alternatives were discussed Type: abscess  Body area: right axilla  Anesthesia: local infiltration  Incision was made with a scalpel.  Local anesthetic: lidocaine 2% w/ epinephrine  Anesthetic total: 5 ml  Complexity: smiple Drainage: purulent  Drainage amount: little   Packing material: none  Patient tolerance: Patient did not tolerate procedure well but there were no immediate complications.    Labs Reviewed - No data to display No results found. 1. Abscess of  right axilla     MDM  26yo F presents w/ multiple abscesses and cellulitis of right axilla.  This is a recurrent problem, has had abscesses of L axilla and groin as well, and I suspect that she has hydradenitis suppurativa.  Largest abscess I&D'd but very little drainage.  D/c'd home w/ clindamycin, vicodin and referral to [facility].  Return precautions discussed.   Otilio Miu, PA-C 02/09/13 [phone]

## 2013-02-10 NOTE — ED Provider Notes (Signed)
Medical screening examination/treatment/procedure(s) were performed by non-physician practitioner and as supervising physician I was immediately available for consultation/collaboration.  Valor [place], MD 02/10/13 0008 

## 2013-04-16 ENCOUNTER — Encounter (HOSPITAL_COMMUNITY): Payer: Self-pay | Admitting: Emergency Medicine

## 2013-04-16 ENCOUNTER — Emergency Department (HOSPITAL_COMMUNITY)
Admission: EM | Admit: 2013-04-16 | Discharge: 2013-04-16 | Disposition: A | Discharge status: Home / Self Care | Payer: Self-pay | Attending: Emergency Medicine | Admitting: Emergency Medicine

## 2013-04-16 DIAGNOSIS — L732 Hidradenitis suppurativa: Secondary | ICD-10-CM | POA: Insufficient documentation

## 2013-04-16 DIAGNOSIS — Z87440 Personal history of urinary (tract) infections: Secondary | ICD-10-CM | POA: Insufficient documentation

## 2013-04-16 DIAGNOSIS — F17200 Nicotine dependence, unspecified, uncomplicated: Secondary | ICD-10-CM | POA: Insufficient documentation

## 2013-04-16 MED ORDER — OXYCODONE-ACETAMINOPHEN 5-325 MG PO TABS: 2.0000 | ORAL_TABLET | Freq: Four times a day (QID) | ORAL | Status: DC | PRN

## 2013-04-16 MED ORDER — CLINDAMYCIN HCL 300 MG PO CAPS: 300.0000 mg | ORAL_CAPSULE | Freq: Four times a day (QID) | ORAL | Status: DC

## 2013-04-16 MED ORDER — ONDANSETRON 4 MG PO TBDP
8.0000 mg | ORAL_TABLET | Freq: Once | ORAL | Status: AC
  Administered 2013-04-16: 8 mg via ORAL
  Filled 2013-04-16: qty 2

## 2013-04-16 MED ORDER — PROMETHAZINE HCL 25 MG PO TABS: 25.0000 mg | ORAL_TABLET | Freq: Four times a day (QID) | ORAL | Status: DC | PRN

## 2013-04-16 MED ORDER — OXYCODONE-ACETAMINOPHEN 5-325 MG PO TABS
2.0000 | ORAL_TABLET | Freq: Once | ORAL | Status: AC
  Administered 2013-04-16: 2 via ORAL
  Filled 2013-04-16: qty 2

## 2013-04-16 NOTE — ED Notes (Signed)
Hx of frequent abscess to right axilla. Was referred to surgery last time but was unable to afford to see him.Now has abscess to right axilla that has "spread" to upper arm.

## 2013-04-16 NOTE — ED Provider Notes (Signed)
CSN: [id number]     Arrival date & time 04/16/13  0916 History  This chart was scribed for non-physician practitioner Junious Silk, PA-C, working with Shelda Jakes, MD by Dorothey Baseman, ED Scribe. This patient was seen in room TR09C/TR09C and the patient's care was started at 9:43 AM.    Chief Complaint  Patient presents with  . Abscess   The history is provided by the patient. No language interpreter was used.   HPI Comments: Elizabeth Burke is a 27 y.o. female who presents to the Emergency Department complaining of a recurring abscess to the right axilla for the past year that has been progressively worsening and spreading for the past 1-2 months. She reports associated drainage and stabbing pain to the area that is exacerbated with movement. She reports taking ibuprofen and applying warm compresses daily with mild, temporary relief. Patient reports that she has been seen here several times in the past (last encounter in August, 2014)  for similar complaints and was treated with Clindamycin, which she states she finished the course without relief, and was referred to surgery, but was unable to follow up due to financial reasons. Patient denies shortness of breath, fever, nausea, and vomiting. She denies any pertinent medical history.   Past Medical History  Diagnosis Date  . Urinary tract infection    Past Surgical History  Procedure Laterality Date  . Nose surgery     No family history on file. History  Substance Use Topics  . Smoking status: Current Every Day Smoker -- 1.00 packs/day  . Smokeless tobacco: Not on file  . Alcohol Use: Yes   OB History   Grav Para Term Preterm Abortions TAB SAB Ect Mult Living                 Review of Systems  Constitutional: Negative for fever.  Respiratory: Negative for shortness of breath.   Gastrointestinal: Negative for nausea and vomiting.  Skin: Positive for wound ( abscess to right axilla).  All other systems reviewed and are  negative.    Allergies  Review of patient's allergies indicates no known allergies.  Home Medications   Current Outpatient Rx  Name  Route  Sig  Dispense  Refill  . clindamycin (CLEOCIN) 300 MG capsule   Oral   Take 1 capsule (300 mg total) by mouth 3 (three) times daily.   20 capsule   0   . ibuprofen (ADVIL,MOTRIN) 200 MG tablet   Oral   Take 400 mg by mouth every 6 (six) hours as needed for pain.         Marland Kitchen oxyCODONE-acetaminophen (PERCOCET/ROXICET) 5-325 MG per tablet   Oral   Take 1 tablet by mouth every 4 (four) hours as needed for pain.   20 tablet   0    Triage Vitals: BP 143/83  Pulse 98  Temp(Src) 97.9 F (36.6 C) (Oral)  Resp 18  Ht 5\' 7"  (1.702 m)  Wt 170 lb (77.111 kg)  BMI 26.62 kg/m2  SpO2 100%  Physical Exam  Nursing note and vitals reviewed. Constitutional: She is oriented to person, place, and time. She appears well-developed and well-nourished. No distress.  HENT:  Head: Normocephalic and atraumatic.  Right Ear: External ear normal.  Left Ear: External ear normal.  Nose: Nose normal.  Mouth/Throat: Oropharynx is clear and moist.  Eyes: Conjunctivae are normal.  Neck: Normal range of motion.  Cardiovascular: Normal rate and regular rhythm.   Pulmonary/Chest: Effort normal. No stridor.  No respiratory distress.  Abdominal: Soft. She exhibits no distension.  Musculoskeletal: Normal range of motion.  Neurological: She is alert and oriented to person, place, and time. She has normal strength.  Skin: Skin is warm and dry. She is not diaphoretic. No erythema.  3 cm area of induration in right axilla with no fluctuance. Exquisitely tender to palpation. No extension into right arm.   Psychiatric: She has a normal mood and affect. Her behavior is normal.    ED Course  Procedures (including critical care time)  DIAGNOSTIC STUDIES: Oxygen Saturation is 100% on room air, normal by my interpretation.    COORDINATION OF CARE: 9:47 AM- Discussed  that the abscess appears like it probably cannot be drained. Will order topical Bacitracin and a dose of Percocet. Will discharge patient with Clindamycin and Percocet to manage symptoms. Advised patient to follow up with surgery and explained that symptoms may persist until she is able to do so. Advised patient to apply warm compresses and to keep the area clean and dry. Discussed treatment plan with patient at bedside and patient verbalized agreement.   Labs Review Labs Reviewed - No data to display Imaging Review No results found.  EKG Interpretation   None       MDM   1. Hydradenitis    Patient presents for worsening hydradenitis suppurativa. No drainable abscess today. No area of fluctuance. She has been unable to see surgery due to lack of insurance. Will give new surgery referral, pain medication, and clindamycin. Discussed the importance of seeing surgery. No systemic signs of infection. Return instructions given. Vital signs stable for discharge. Patient / Family / Caregiver informed of clinical course, understand medical decision-making process, and agree with plan.  I personally performed the services described in this documentation, which was scribed in my presence. The recorded information has been reviewed and is accurate.      Mora Bellman, PA-C 04/16/13 1014

## 2013-04-18 NOTE — ED Provider Notes (Signed)
Medical screening examination/treatment/procedure(s) were performed by non-physician practitioner and as supervising physician I was immediately available for consultation/collaboration.    Shelda Jakes, MD 04/18/13 [phone]

## 2013-04-29 ENCOUNTER — Emergency Department (HOSPITAL_COMMUNITY)
Admission: EM | Admit: 2013-04-29 | Discharge: 2013-04-29 | Disposition: A | Discharge status: Home / Self Care | Payer: Self-pay | Attending: Emergency Medicine | Admitting: Emergency Medicine

## 2013-04-29 DIAGNOSIS — Z792 Long term (current) use of antibiotics: Secondary | ICD-10-CM | POA: Insufficient documentation

## 2013-04-29 DIAGNOSIS — G8929 Other chronic pain: Secondary | ICD-10-CM | POA: Insufficient documentation

## 2013-04-29 DIAGNOSIS — F17200 Nicotine dependence, unspecified, uncomplicated: Secondary | ICD-10-CM | POA: Insufficient documentation

## 2013-04-29 DIAGNOSIS — M25473 Effusion, unspecified ankle: Secondary | ICD-10-CM | POA: Insufficient documentation

## 2013-04-29 DIAGNOSIS — M25476 Effusion, unspecified foot: Secondary | ICD-10-CM | POA: Insufficient documentation

## 2013-04-29 DIAGNOSIS — L732 Hidradenitis suppurativa: Secondary | ICD-10-CM | POA: Insufficient documentation

## 2013-04-29 DIAGNOSIS — Z87440 Personal history of urinary (tract) infections: Secondary | ICD-10-CM | POA: Insufficient documentation

## 2013-04-29 LAB — COMPREHENSIVE METABOLIC PANEL
AST: 13 U/L (ref 0–37)
BUN: 6 mg/dL (ref 6–23)
CO2: 29 mEq/L (ref 19–32)
Calcium: 9 mg/dL (ref 8.4–10.5)
Chloride: 101 mEq/L (ref 96–112)
Creatinine, Ser: 0.6 mg/dL (ref 0.50–1.10)
GFR calc Af Amer: >90 mL/min (ref >90)
GFR calc non Af Amer: >90 mL/min (ref >90)
Glucose, Bld: 104 mg/dL — ABNORMAL HIGH (ref 70–99)
Total Bilirubin: 0.2 mg/dL — ABNORMAL LOW (ref 0.3–1.2)

## 2013-04-29 LAB — CBC WITH DIFFERENTIAL/PLATELET
Eosinophils Relative: 3 % (ref 0–5)
HCT: 32 % — ABNORMAL LOW (ref 36.0–46.0)
Hemoglobin: 10.1 g/dL — ABNORMAL LOW (ref 12.0–15.0)
Lymphocytes Relative: 26 % (ref 12–46)
Lymphs Abs: 2.6 10*3/uL (ref 0.7–4.0)
MCV: 75.1 fL — ABNORMAL LOW (ref 78.0–100.0)
Monocytes Absolute: 1.1 10*3/uL — ABNORMAL HIGH (ref 0.1–1.0)
Monocytes Relative: 11 % (ref 3–12)
Neutro Abs: 6 10*3/uL (ref 1.7–7.7)
RBC: 4.26 MIL/uL (ref 3.87–5.11)
RDW: 15.4 % (ref 11.5–15.5)
WBC: 10 10*3/uL (ref 4.0–10.5)

## 2013-04-29 MED ORDER — OXYCODONE-ACETAMINOPHEN 5-325 MG PO TABS
2.0000 | ORAL_TABLET | Freq: Once | ORAL | Status: DC
  Filled 2013-04-29: qty 2

## 2013-04-29 MED ORDER — TRAMADOL HCL 50 MG PO TABS: 50.0000 mg | ORAL_TABLET | Freq: Four times a day (QID) | ORAL | Status: DC | PRN

## 2013-04-29 NOTE — ED Notes (Signed)
Pt states that she has right arm infection that is now affecting her legs and has left ankle swelling. Pt states that she has been referred to see a surgeon but she cant afford to see them and is in severe pain.

## 2013-04-29 NOTE — ED Notes (Signed)
Pt states that she has right arm infection that is now affecting her legs and has left ankle swelling. Pt states that she has been referred to see a surgeon but she cant afford to see them and is in severe pain. States her arm has been lanced approx. 6 times but never heals. Taking ibuprofen bc percocet hurts her head.

## 2013-04-29 NOTE — ED Provider Notes (Signed)
CSN: [id number]     Arrival date & time 04/29/13  1804 History   First MD Initiated Contact with Patient 04/29/13 2023     Chief Complaint  Patient presents with  . Arm Pain  . Leg Pain    bilat  . Joint Swelling    left ankle   (Consider location/radiation/quality/duration/timing/severity/associated sxs/prior Treatment) Patient is a 27 y.o. female presenting with arm pain and leg pain. The history is provided by the patient.  Arm Pain  Leg Pain  patient here complaining of pain in her right axilla which is been chronic in nature secondary to hidradenitis . Denies any fever or chills. Symptoms have been persistent for months. Seen recently and was prescribed clindamycin but did not get the prescription filled. Patient states that she has been taking a thousand milligrams of ibuprofen 3 times a day. Does note some lower extremity edema. No fever or chills. No change in her chronic drainage. She is able to work 2 jobs 7 days a week despite her current symptoms. She has been given multiple referral to general surgery but has been unable to keep them due to financial constraints  Past Medical History  Diagnosis Date  . Urinary tract infection    Past Surgical History  Procedure Laterality Date  . Nose surgery     No family history on file. History  Substance Use Topics  . Smoking status: Current Every Day Smoker -- 1.00 packs/day  . Smokeless tobacco: Not on file  . Alcohol Use: Yes   OB History   Grav Para Term Preterm Abortions TAB SAB Ect Mult Living                 Review of Systems  All other systems reviewed and are negative.    Allergies  Review of patient's allergies indicates no known allergies.  Home Medications   Current Outpatient Rx  Name  Route  Sig  Dispense  Refill  . ibuprofen (ADVIL,MOTRIN) 200 MG tablet   Oral   Take 400 mg by mouth every 6 (six) hours as needed for pain.         . clindamycin (CLEOCIN) 300 MG capsule   Oral   Take 1 capsule  (300 mg total) by mouth 4 (four) times daily. X 7 days   28 capsule   0   . oxyCODONE-acetaminophen (PERCOCET/ROXICET) 5-325 MG per tablet   Oral   Take 2 tablets by mouth every 6 (six) hours as needed for pain.   20 tablet   0   . promethazine (PHENERGAN) 25 MG tablet   Oral   Take 1 tablet (25 mg total) by mouth every 6 (six) hours as needed for nausea.   12 tablet   0    BP 131/75  Pulse 92  Temp(Src) 98.4 F (36.9 C) (Oral)  Resp 18  SpO2 100%  LMP 04/01/2013 Physical Exam  Nursing note and vitals reviewed. Constitutional: She is oriented to person, place, and time. She appears well-developed and well-nourished.  Non-toxic appearance. No distress.  HENT:  Head: Normocephalic and atraumatic.  Eyes: Conjunctivae, EOM and lids are normal. Pupils are equal, round, and reactive to light.  Neck: Normal range of motion. Neck supple. No tracheal deviation present. No mass present.  Cardiovascular: Normal rate, regular rhythm and normal heart sounds.  Exam reveals no gallop.   No murmur heard. Pulmonary/Chest: Effort normal and breath sounds normal. No stridor. No respiratory distress. She has no decreased breath sounds. She  has no wheezes. She has no rhonchi. She has no rales.  Abdominal: Soft. Normal appearance and bowel sounds are normal. She exhibits no distension. There is no tenderness. There is no rebound and no CVA tenderness.  Musculoskeletal: Normal range of motion. She exhibits no edema and no tenderness.       Arms: Neurological: She is alert and oriented to person, place, and time. She has normal strength. No cranial nerve deficit or sensory deficit. GCS eye subscore is 4. GCS verbal subscore is 5. GCS motor subscore is 6.  Skin: Skin is warm and dry. No abrasion and no rash noted.  Psychiatric: She has a normal mood and affect. Her speech is normal and behavior is normal.    ED Course  Procedures (including critical care time) Labs Review Labs Reviewed  CBC WITH  DIFFERENTIAL  COMPREHENSIVE METABOLIC PANEL   Imaging Review No results found.  EKG Interpretation   None       MDM  No diagnosis found. Patient's renal function has normal. She was instructed to not take 3000 g of ibuprofen per day. We'll give her a short course of pain medication and instructed her to take her Cleocin. Also given a referral to the wound care clinic as well.   Toy Baker, MD 04/29/13 [phone]

## 2013-05-04 ENCOUNTER — Ambulatory Visit: Payer: Self-pay | Admitting: Emergency Medicine

## 2013-05-04 VITALS — BP 122/90 | HR 92 | Temp 98.1°F | Resp 18 | Ht 68.0 in | Wt 167.8 lb

## 2013-05-04 DIAGNOSIS — M359 Systemic involvement of connective tissue, unspecified: Secondary | ICD-10-CM

## 2013-05-04 DIAGNOSIS — M2550 Pain in unspecified joint: Secondary | ICD-10-CM

## 2013-05-04 DIAGNOSIS — R39198 Other difficulties with micturition: Secondary | ICD-10-CM

## 2013-05-04 DIAGNOSIS — M549 Dorsalgia, unspecified: Secondary | ICD-10-CM

## 2013-05-04 LAB — POCT URINALYSIS DIPSTICK
Glucose, UA: NEGATIVE
Ketones, UA: 15
Nitrite, UA: NEGATIVE
Protein, UA: 100
Spec Grav, UA: >=1.03
Urobilinogen, UA: 1
pH, UA: 5.5

## 2013-05-04 LAB — URIC ACID: Uric Acid, Serum: 5.5 mg/dL (ref 2.4–7.0)

## 2013-05-04 LAB — POCT CBC
Hemoglobin: 10.2 g/dL — AB (ref 12.2–16.2)
Lymph, poc: 2.5 (ref 0.6–3.4)
MCH, POC: 23.4 pg — AB (ref 27–31.2)
MCHC: 29.9 g/dL — AB (ref 31.8–35.4)
MCV: 78.4 fL — AB (ref 80–97)
RBC: 4.35 M/uL (ref 4.04–5.48)
WBC: 9.1 10*3/uL (ref 4.6–10.2)

## 2013-05-04 LAB — POCT UA - MICROSCOPIC ONLY
Bacteria, U Microscopic: NEGATIVE
Casts, Ur, LPF, POC: NEGATIVE
Crystals, Ur, HPF, POC: NEGATIVE
Epithelial cells, urine per micros: NEGATIVE
Mucus, UA: NEGATIVE
Yeast, UA: NEGATIVE

## 2013-05-04 MED ORDER — NAPROXEN SODIUM 550 MG PO TABS: 550.0000 mg | ORAL_TABLET | Freq: Two times a day (BID) | ORAL | Status: DC

## 2013-05-04 NOTE — Patient Instructions (Signed)
Arthralgia Your caregiver has diagnosed you as suffering from an arthralgia. Arthralgia means there is pain in a joint. This can come from many reasons including:  Bruising the joint which causes soreness (inflammation) in the joint.  Wear and tear on the joints which occur as we grow older (osteoarthritis).  Overusing the joint.  Various forms of arthritis.  Infections of the joint. Regardless of the cause of pain in your joint, most of these different pains respond to anti-inflammatory drugs and rest. The exception to this is when a joint is infected, and these cases are treated with antibiotics, if it is a bacterial infection. HOME CARE INSTRUCTIONS   Rest the injured area for as long as directed by your caregiver. Then slowly start using the joint as directed by your caregiver and as the pain allows. Crutches as directed may be useful if the ankles, knees or hips are involved. If the knee was splinted or casted, continue use and care as directed. If an stretchy or elastic wrapping bandage has been applied today, it should be removed and re-applied every 3 to 4 hours. It should not be applied tightly, but firmly enough to keep swelling down. Watch toes and feet for swelling, bluish discoloration, coldness, numbness or excessive pain. If any of these problems (symptoms) occur, remove the ace bandage and re-apply more loosely. If these symptoms persist, contact your caregiver or return to this location.  For the first 24 hours, keep the injured extremity elevated on pillows while lying down.  Apply ice for 15-20 minutes to the sore joint every couple hours while awake for the first half day. Then 3-4 times per day for the first 48 hours. Put the ice in a plastic bag and place a towel between the bag of ice and your skin.  Wear any splinting, casting, elastic bandage applications, or slings as instructed.  Only take over-the-counter or prescription medicines for pain, discomfort, or fever as  directed by your caregiver. Do not use aspirin immediately after the injury unless instructed by your physician. Aspirin can cause increased bleeding and bruising of the tissues.  If you were given crutches, continue to use them as instructed and do not resume weight bearing on the sore joint until instructed. Persistent pain and inability to use the sore joint as directed for more than 2 to 3 days are warning signs indicating that you should see a caregiver for a follow-up visit as soon as possible. Initially, a hairline fracture (break in bone) may not be evident on X-rays. Persistent pain and swelling indicate that further evaluation, non-weight bearing or use of the joint (use of crutches or slings as instructed), or further X-rays are indicated. X-rays may sometimes not show a small fracture until a week or 10 days later. Make a follow-up appointment with your own caregiver or one to whom we have referred you. A radiologist (specialist in reading X-rays) may read your X-rays. Make sure you know how you are to obtain your X-ray results. Do not assume everything is normal if you do not hear from us. SEEK MEDICAL CARE IF: Bruising, swelling, or pain increases. SEEK IMMEDIATE MEDICAL CARE IF:   Your fingers or toes are numb or blue.  The pain is not responding to medications and continues to stay the same or get worse.  The pain in your joint becomes severe.  You develop a fever over 102 F (38.9 C).  It becomes impossible to move or use the joint. MAKE SURE YOU:     Understand these instructions.  Will watch your condition.  Will get help right away if you are not doing well or get worse. Document Released: 06/21/2005 Document Revised: 09/13/2011 Document Reviewed: 02/07/2008 Exit[employer] Patient Information 2014 [employer].  

## 2013-05-04 NOTE — Progress Notes (Addendum)
[facility] [address] [place] [address] [phone]  Date:  05/04/2013   Name:  Elizabeth Burke   DOB:  1985-11-18   MRN:  [id number]  PCP:  No primary provider on file.    Chief Complaint: Back Pain, Recurrent Skin Infections, Infrequent Urination and Leg Pain   History of Present Illness:  Elizabeth Burke is a 27 y.o. very pleasant female patient who presents with the following:  The patient presents with a complicated history of recurrent hydradenitis.  Says she was put on antibiotics but only started them Monday this week.  She claims to have been told that she had swelling in her left ankle and pain down her right leg from the infection in her axilla by the ER doc.  She refused referral to surgeon citing lack of insurance.  She claims to have been told that she had kidney injury from taking so much motrin.  She said they did not inform her of her labs.  She has concerns about kidney injury as well as she claims to be drinking normally and has little urine output.  She is unable to walk due to pain in her left ankle and swelling.  She also has severe pain from her right buttock into her right ankle.  She is very tearful and crying in the office.  No improvement with over the counter medications or other home remedies. Denies other complaint or health concern today.   There are no active problems to display for this patient.   Past Medical History  Diagnosis Date  . Urinary tract infection   . Allergy   . Arthritis   . Neuromuscular disorder     Past Surgical History  Procedure Laterality Date  . Nose surgery      History  Substance Use Topics  . Smoking status: Current Every Day Smoker -- 1.00 packs/day  . Smokeless tobacco: Not on file  . Alcohol Use: Yes    Family History  Problem Relation Age of Onset  . Cancer Maternal Grandmother   . Diabetes Maternal Grandfather   . Cancer Maternal Grandfather   . Diabetes Paternal  Grandmother     No Known Allergies  Medication list has been reviewed and updated.  Current Outpatient Prescriptions on File Prior to Visit  Medication Sig Dispense Refill  . clindamycin (CLEOCIN) 300 MG capsule Take 1 capsule (300 mg total) by mouth 4 (four) times daily. X 7 days  28 capsule  0  . ibuprofen (ADVIL,MOTRIN) 200 MG tablet Take 400 mg by mouth every 6 (six) hours as needed for pain.      Marland Kitchen oxyCODONE-acetaminophen (PERCOCET/ROXICET) 5-325 MG per tablet Take 2 tablets by mouth every 6 (six) hours as needed for pain.  20 tablet  0  . promethazine (PHENERGAN) 25 MG tablet Take 1 tablet (25 mg total) by mouth every 6 (six) hours as needed for nausea.  12 tablet  0  . traMADol (ULTRAM) 50 MG tablet Take 1 tablet (50 mg total) by mouth every 6 (six) hours as needed for pain.  30 tablet  0   No current facility-administered medications on file prior to visit.    Review of Systems:  As per HPI, otherwise negative.    Physical Examination: Filed Vitals:   05/04/13 1302  BP: 122/90  Pulse: 92  Temp: 98.1 F (36.7 C)  Resp: 18   Filed Vitals:   05/04/13 1302  Height: 5\' 8"  (1.727 m)  Weight: 167 lb 12.8 oz (76.114 kg)   Body mass index is 25.52 kg/(m^2). Ideal Body Weight: Weight in (lb) to have BMI = 25: 164.1  GEN: WDWN, NAD, Non-toxic, A & O x 3 HEENT: Atraumatic, Normocephalic. Neck supple. No masses, No LAD. Ears and Nose: No external deformity. CV: RRR, No M/G/R. No JVD. No thrill. No extra heart sounds. PULM: CTA B, no wheezes, crackles, rhonchi. No retractions. No resp. distress. No accessory muscle use. ABD: S, NT, ND, +BS. No rebound. No HSM. EXTR: No c/c/e  Tenderness in left ankle joint.  No erythema or ecchymosis or swelling. NEURO antalgic gait. DTR and strength less on right than left PSYCH: Normally interactive. Conversant. Tearful.    Assessment and Plan: Sciatic neuritis Hydradenitis suppurativa by history Left ankle  arthralgia Dehydration Labs Increase fluids Change to anaprox  Signed,  Phillips Odor, MD   Results for orders placed in visit on 05/04/13  POCT UA - MICROSCOPIC ONLY      Result Value Range   WBC, Ur, HPF, POC 0-3     RBC, urine, microscopic TNTC     Bacteria, U Microscopic neg     Mucus, UA neg     Epithelial cells, urine per micros neg     Crystals, Ur, HPF, POC neg     Casts, Ur, LPF, POC neg     Yeast, UA neg    POCT URINALYSIS DIPSTICK      Result Value Range   Color, UA dk amber     Clarity, UA cloudy     Glucose, UA neg     Bilirubin, UA mod     Ketones, UA 15     Spec Grav, UA >=1.030     Blood, UA large     pH, UA 5.5     Protein, UA 100     Urobilinogen, UA 1.0     Nitrite, UA neg     Leukocytes, UA Trace

## 2013-05-05 ENCOUNTER — Other Ambulatory Visit: Payer: Self-pay | Admitting: Emergency Medicine

## 2013-05-05 DIAGNOSIS — M359 Systemic involvement of connective tissue, unspecified: Secondary | ICD-10-CM

## 2013-05-05 MED ORDER — OXYCODONE-ACETAMINOPHEN 5-325 MG PO TABS: 1.0000 | ORAL_TABLET | Freq: Four times a day (QID) | ORAL | Status: DC | PRN

## 2013-05-05 MED ORDER — PREDNISONE 10 MG PO KIT: PACK | ORAL | Status: DC

## 2013-05-05 NOTE — Addendum Note (Signed)
Addended by: Carmelina Dane on: 05/05/2013 08:25 AM   Modules accepted: Orders

## 2013-05-07 ENCOUNTER — Encounter (HOSPITAL_BASED_OUTPATIENT_CLINIC_OR_DEPARTMENT_OTHER): Payer: Self-pay

## 2013-05-08 ENCOUNTER — Encounter (HOSPITAL_BASED_OUTPATIENT_CLINIC_OR_DEPARTMENT_OTHER): Payer: Self-pay | Attending: General Surgery

## 2013-05-08 DIAGNOSIS — Z79899 Other long term (current) drug therapy: Secondary | ICD-10-CM | POA: Insufficient documentation

## 2013-05-08 DIAGNOSIS — L732 Hidradenitis suppurativa: Secondary | ICD-10-CM | POA: Insufficient documentation

## 2013-05-08 DIAGNOSIS — F17200 Nicotine dependence, unspecified, uncomplicated: Secondary | ICD-10-CM | POA: Insufficient documentation

## 2013-05-09 NOTE — H&P (Signed)
NAMEMARCEE, Burke NO.:  [id number]  MEDICAL RECORD NO.:  [id number]  LOCATION:  FOOT                         FACILITY:  [facility]  PHYSICIAN:  Joanne Gavel, M.D.        DATE OF BIRTH:  1985/07/29  DATE OF ADMISSION:  05/08/2013 DATE OF DISCHARGE:                             HISTORY & PHYSICAL   CHIEF COMPLAINT:  Painful draining wounds, left groin and right axilla.  HISTORY OF PRESENT ILLNESS:  This is a 27 year old female who has had hidradenitis suppurativa of the right axilla and left groin for approximately 1 year.  This occasionally drains, becomes painful.  She is placed on antibiotics.  It seems to be heal for a while but then comes back.  She was seen in the emergency room and sent here.  PAST MEDICAL HISTORY:  Essentially negative, except the hidradenitis.  PAST SURGICAL HISTORY:  Negative.  SOCIAL HISTORY:  Cigarettes 1 pack per day.  Alcohol occasionally.  MEDICATIONS:  Advil, clindamycin, oxycodone, and prednisone.  ALLERGIES:  None.  REVIEW OF SYSTEMS:  Essentially negative.  PHYSICAL EXAMINATION:  VITAL SIGNS:  Temperature 98.8, pulse 100, respirations 18, blood pressure 128/85. GENERAL APPEARANCE:  Well developed, well nourished, in no distress. CHEST:  Clear. HEART:  Regular rhythm. EXTREMITIES:  In the right axilla, there is a area of the swelling and pus-like drainage 1.1 x 5.6 x 0.1.  There appears to be no open wound. She will not remove her  pants to allow [place] to examine the groin.  IMPRESSION:  Hidradenitis suppurativa.  She is on antibiotics and uses antibiotic ointment.  She has seen surgeons who do not want to operate on it because she is medically destitute.  We will see her p.r.n.     Joanne Gavel, M.D.     RA/MEDQ  D:  05/08/2013  T:  05/09/2013  Job:  [id number]

## 2013-05-23 ENCOUNTER — Telehealth: Payer: Self-pay

## 2013-05-23 MED ORDER — NAPROXEN SODIUM 550 MG PO TABS: 550.0000 mg | ORAL_TABLET | Freq: Two times a day (BID) | ORAL | Status: DC

## 2013-05-23 NOTE — Telephone Encounter (Signed)
No refills on steroids. If patient needs to be seen, ask her to come in.

## 2013-05-23 NOTE — Telephone Encounter (Signed)
Please advise 

## 2013-05-23 NOTE — Telephone Encounter (Signed)
Pt is needing to get a refill on her steriods until the rhematologist referral is complete the referral has been pending since 05/07/13 with dr Anson [place] office

## 2013-05-23 NOTE — Telephone Encounter (Signed)
Thanks. Called her to advise , she has not tried the naproxen. Advised her to try the Naproxen, per Dr Dareen Piano.

## 2013-05-29 ENCOUNTER — Ambulatory Visit (INDEPENDENT_AMBULATORY_CARE_PROVIDER_SITE_OTHER): Payer: Self-pay | Admitting: Surgery

## 2013-06-14 ENCOUNTER — Ambulatory Visit: Payer: Self-pay | Admitting: Internal Medicine

## 2013-07-06 ENCOUNTER — Encounter (INDEPENDENT_AMBULATORY_CARE_PROVIDER_SITE_OTHER): Payer: Self-pay

## 2013-07-06 ENCOUNTER — Telehealth (INDEPENDENT_AMBULATORY_CARE_PROVIDER_SITE_OTHER): Payer: Self-pay | Admitting: Surgery

## 2013-07-06 ENCOUNTER — Ambulatory Visit (INDEPENDENT_AMBULATORY_CARE_PROVIDER_SITE_OTHER): Payer: 59 | Admitting: Surgery

## 2013-07-06 ENCOUNTER — Encounter (INDEPENDENT_AMBULATORY_CARE_PROVIDER_SITE_OTHER): Payer: Self-pay | Admitting: Surgery

## 2013-07-06 VITALS — BP 124/80 | HR 72 | Temp 98.6°F | Resp 14 | Ht 67.75 in | Wt 174.8 lb

## 2013-07-06 DIAGNOSIS — L732 Hidradenitis suppurativa: Secondary | ICD-10-CM | POA: Insufficient documentation

## 2013-07-06 MED ORDER — CLINDAMYCIN PHOSPHATE 1 % EX SOLN: CUTANEOUS | Status: DC

## 2013-07-06 MED ORDER — CLINDAMYCIN HCL 300 MG PO CAPS: 300.0000 mg | ORAL_CAPSULE | Freq: Three times a day (TID) | ORAL | Status: DC

## 2013-07-06 NOTE — Telephone Encounter (Signed)
Pt made aware of [facility] financial obligation

## 2013-07-06 NOTE — Progress Notes (Signed)
Patient ID: Elizabeth Burke, female   DOB: Jul 26, 1985, 28 y.o.   MRN: [id number]  Chief Complaint  Patient presents with  . New Evaluation    Hidradenitis    HPI Elizabeth Burke is a 28 y.o. female.   HPI This is a pleasant 28 year old female referred to me by the family medicine teaching service and Dr. Andi Devon for evaluation of hidradenitis. She has had areas in her right axilla and bilateral groins for many years. She has been on numerous antibiotics and has had multiple incision and drainage procedures. She is referred for surgical consideration for wide excision of these areas. Past Medical History  Diagnosis Date  . Urinary tract infection   . Allergy   . Arthritis   . Neuromuscular disorder     Past Surgical History  Procedure Laterality Date  . Nose surgery      Family History  Problem Relation Age of Onset  . Cancer Maternal Grandmother     bladder and liver  . Diabetes Maternal Grandfather   . Cancer Maternal Grandfather     prostate  . Diabetes Paternal Grandmother     Social History History  Substance Use Topics  . Smoking status: Current Every Day Smoker -- 1.00 packs/day  . Smokeless tobacco: Never Used  . Alcohol Use: Yes    No Known Allergies  Current Outpatient Prescriptions  Medication Sig Dispense Refill  . ibuprofen (ADVIL,MOTRIN) 200 MG tablet Take 400 mg by mouth every 6 (six) hours as needed for pain.       No current facility-administered medications for this visit.    Review of Systems Review of Systems  Constitutional: Negative for fever, chills and unexpected weight change.  HENT: Negative for congestion, hearing loss, sore throat, trouble swallowing and voice change.   Eyes: Negative for visual disturbance.  Respiratory: Negative for cough and wheezing.   Cardiovascular: Negative for chest pain, palpitations and leg swelling.  Gastrointestinal: Negative for nausea, vomiting, abdominal pain, diarrhea, constipation,  blood in stool, abdominal distention and anal bleeding.  Genitourinary: Negative for hematuria, vaginal bleeding and difficulty urinating.  Musculoskeletal: Negative for arthralgias.  Skin: Negative for rash and wound.  Neurological: Negative for seizures, syncope and headaches.  Hematological: Negative for adenopathy. Does not bruise/bleed easily.  Psychiatric/Behavioral: Negative for confusion.    Blood pressure 124/80, pulse 72, temperature 98.6 F (37 C), temperature source Temporal, resp. rate 14, height 5' 7.75" (1.721 m), weight 174 lb 12.8 oz (79.289 kg).  Physical Exam Physical Exam  Constitutional: She is oriented to person, place, and time. She appears well-developed and well-nourished. No distress.  HENT:  Head: Normocephalic and atraumatic.  Right Ear: External ear normal.  Left Ear: External ear normal.  Nose: Nose normal.  Mouth/Throat: Oropharynx is clear and moist. No oropharyngeal exudate.  Eyes: Conjunctivae are normal. Pupils are equal, round, and reactive to light. Right eye exhibits no discharge. Left eye exhibits no discharge. No scleral icterus.  Neck: Normal range of motion. Neck supple. No tracheal deviation present.  Cardiovascular: Normal rate, regular rhythm, normal heart sounds and intact distal pulses.   No murmur heard. Pulmonary/Chest: Effort normal and breath sounds normal. No respiratory distress. She has no wheezes. She has no rales.  Abdominal: Soft. Bowel sounds are normal. She exhibits no distension. There is no tenderness. There is no rebound.  Musculoskeletal: Normal range of motion. She exhibits no edema and no tenderness.  Lymphadenopathy:    She has no cervical adenopathy.  Neurological:  She is alert and oriented to person, place, and time.  Skin: Skin is warm. She is not diaphoretic.  She has significant chronic draining sinus areas in her right axilla with tenderness consistent with hidradenitis. There are also open areas in her left  groin/proximal thigh consistent with hidradenitis  Psychiatric: Her behavior is normal. Judgment normal.    Data Reviewed   Assessment    Chronic hidradenitis involving the right axilla and left groin     Plan    Wide excision of these 2 areas is recommended for local control. Again, I explained the diagnosis of hidradenitis and the fact this is not a curable disease. Surgery is for local control. I believe she is a candidate for this. I explained the risks of surgery which includes but is not limited to bleeding, infection, having a chronic open wound, etc. I will continue her oral clindamycin as well as prescribed clindamycin gel preoperatively. Surgery will be scheduled        Deneise Getty A 07/06/2013, 11:17 AM

## 2013-07-09 ENCOUNTER — Telehealth: Payer: Self-pay

## 2013-07-09 NOTE — Telephone Encounter (Signed)
Patient is needing a letter stating that she is cleared for work.  Patient states that she was here in October.  Patient was here for a personal visit and not I/A.      [employer]#: [phone]

## 2013-07-09 NOTE — Telephone Encounter (Signed)
Sure, please handle it

## 2013-07-09 NOTE — Telephone Encounter (Signed)
Called her, she states she had a physical done at Employee services and they have seen where she was here. Patient wants note stating she did not have any restrictions at the time she was seen. I can write note, if you want. Please advise if okay

## 2013-07-10 ENCOUNTER — Encounter (INDEPENDENT_AMBULATORY_CARE_PROVIDER_SITE_OTHER): Payer: Self-pay | Admitting: General Surgery

## 2013-07-10 NOTE — Telephone Encounter (Signed)
Not provided. Patient advised this is ready for pick up.

## 2013-07-17 ENCOUNTER — Inpatient Hospital Stay: Payer: Self-pay | Admitting: Internal Medicine

## 2013-07-19 ENCOUNTER — Encounter (HOSPITAL_COMMUNITY): Payer: Self-pay

## 2013-07-20 NOTE — Pre-Procedure Instructions (Signed)
Elizabeth Burke  07/20/2013   Your procedure is scheduled on: Tuesday, July 31, 2013  [facility] "[facility]" [address] at [employer]5:30 AM.  Call this number if you have problems the morning of surgery: [phone]   Remember:   Do not eat food or drink liquids after midnight.   Take these medicines the morning of surgery with A SIP OF WATER: None   Do not wear jewelry, make-up or nail polish.  Do not wear lotions, powders, or perfumes. You may wear deodorant.  Do not shave 48 hours prior to surgery.   Do not bring valuables to the hospital.  [facility] not responsible                  for any belongings or valuables.               Contacts, dentures or bridgework may not be worn into surgery.  Leave suitcase in the car. After surgery it may be brought to your room.  For patients admitted to the hospital, discharge time is determined by your                treatment team.               Patients discharged the day of surgery will not be allowed to drive  home.    Special Instructions: Shower using CHG 2 nights before surgery and the night before surgery.  If you shower the day of surgery use CHG.  Use special wash - you have one bottle of CHG for all showers.  You should use approximately 1/3 of the bottle for each shower.   Please read over the following fact sheets that you were given: Pain Booklet, Coughing and Deep Breathing and Surgical Site Infection Prevention

## 2013-07-23 ENCOUNTER — Encounter (HOSPITAL_COMMUNITY): Payer: Self-pay

## 2013-07-23 ENCOUNTER — Encounter (HOSPITAL_COMMUNITY)
Admission: RE | Admit: 2013-07-23 | Discharge: 2013-07-23 | Disposition: A | Discharge status: Home / Self Care | Payer: 59 | Source: Ambulatory Visit | Attending: Surgery | Admitting: Surgery

## 2013-07-23 DIAGNOSIS — Z01812 Encounter for preprocedural laboratory examination: Secondary | ICD-10-CM | POA: Insufficient documentation

## 2013-07-23 DIAGNOSIS — Z01818 Encounter for other preprocedural examination: Secondary | ICD-10-CM | POA: Insufficient documentation

## 2013-07-23 HISTORY — DX: Gastro-esophageal reflux disease without esophagitis: K21.9

## 2013-07-23 HISTORY — DX: Headache: R51

## 2013-07-23 HISTORY — DX: Personal history of other diseases of the digestive system: Z87.19

## 2013-07-23 LAB — CBC
HEMATOCRIT: 33.9 % — AB (ref 36.0–46.0)
HEMOGLOBIN: 10.7 g/dL — AB (ref 12.0–15.0)
MCH: 24.3 pg — ABNORMAL LOW (ref 26.0–34.0)
MCHC: 31.6 g/dL (ref 30.0–36.0)
MCV: 76.9 fL — AB (ref 78.0–100.0)
Platelets: 339 10*3/uL (ref 150–400)
RBC: 4.41 MIL/uL (ref 3.87–5.11)
RDW: 14.9 % (ref 11.5–15.5)
WBC: 6.6 10*3/uL (ref 4.0–10.5)

## 2013-07-23 LAB — HCG, SERUM, QUALITATIVE: Preg, Serum: NEGATIVE

## 2013-07-30 MED ORDER — CEFAZOLIN SODIUM-DEXTROSE 2-3 GM-% IV SOLR
2.0000 g | INTRAVENOUS | Status: AC
  Administered 2013-07-31: 2 g via INTRAVENOUS
  Filled 2013-07-30: qty 50

## 2013-07-30 NOTE — H&P (Signed)
Chief Complaint   Patient presents with   .  New Evaluation     Hidradenitis   HPI  Elizabeth Burke is a 28 y.o. female.  HPI  This is a pleasant 28 year old female referred to me by the family medicine teaching service and Dr. Andi Devon for evaluation of hidradenitis. She has had areas in her right axilla and bilateral groins for many years. She has been on numerous antibiotics and has had multiple incision and drainage procedures. She is referred for surgical consideration for wide excision of these areas.  Past Medical History   Diagnosis  Date   .  Urinary tract infection    .  Allergy    .  Arthritis    .  Neuromuscular disorder     Past Surgical History   Procedure  Laterality  Date   .  Nose surgery      Family History   Problem  Relation  Age of Onset   .  Cancer  Maternal Grandmother      bladder and liver   .  Diabetes  Maternal Grandfather    .  Cancer  Maternal Grandfather      prostate   .  Diabetes  Paternal Grandmother    Social History  History   Substance Use Topics   .  Smoking status:  Current Every Day Smoker -- 1.00 packs/day   .  Smokeless tobacco:  Never Used   .  Alcohol Use:  Yes   No Known Allergies  Current Outpatient Prescriptions   Medication  Sig  Dispense  Refill   .  ibuprofen (ADVIL,MOTRIN) 200 MG tablet  Take 400 mg by mouth every 6 (six) hours as needed for pain.      No current facility-administered medications for this visit.   Review of Systems  Review of Systems  Constitutional: Negative for fever, chills and unexpected weight change.  HENT: Negative for congestion, hearing loss, sore throat, trouble swallowing and voice change.  Eyes: Negative for visual disturbance.  Respiratory: Negative for cough and wheezing.  Cardiovascular: Negative for chest pain, palpitations and leg swelling.  Gastrointestinal: Negative for nausea, vomiting, abdominal pain, diarrhea, constipation, blood in stool, abdominal distention and anal  bleeding.  Genitourinary: Negative for hematuria, vaginal bleeding and difficulty urinating.  Musculoskeletal: Negative for arthralgias.  Skin: Negative for rash and wound.  Neurological: Negative for seizures, syncope and headaches.  Hematological: Negative for adenopathy. Does not bruise/bleed easily.  Psychiatric/Behavioral: Negative for confusion.  Blood pressure 124/80, pulse 72, temperature 98.6 F (37 C), temperature source Temporal, resp. rate 14, height 5' 7.75" (1.721 m), weight 174 lb 12.8 oz (79.289 kg).  Physical Exam  Physical Exam  Constitutional: She is oriented to person, place, and time. She appears well-developed and well-nourished. No distress.  HENT:  Head: Normocephalic and atraumatic.  Right Ear: External ear normal.  Left Ear: External ear normal.  Nose: Nose normal.  Mouth/Throat: Oropharynx is clear and moist. No oropharyngeal exudate.  Eyes: Conjunctivae are normal. Pupils are equal, round, and reactive to light. Right eye exhibits no discharge. Left eye exhibits no discharge. No scleral icterus.  Neck: Normal range of motion. Neck supple. No tracheal deviation present.  Cardiovascular: Normal rate, regular rhythm, normal heart sounds and intact distal pulses.  No murmur heard.  Pulmonary/Chest: Effort normal and breath sounds normal. No respiratory distress. She has no wheezes. She has no rales.  Abdominal: Soft. Bowel sounds are normal. She exhibits no distension. There  is no tenderness. There is no rebound.  Musculoskeletal: Normal range of motion. She exhibits no edema and no tenderness.  Lymphadenopathy:  She has no cervical adenopathy.  Neurological: She is alert and oriented to person, place, and time.  Skin: Skin is warm. She is not diaphoretic.  She has significant chronic draining sinus areas in her right axilla with tenderness consistent with hidradenitis. There are also open areas in her left groin/proximal thigh consistent with hidradenitis   Psychiatric: Her behavior is normal. Judgment normal.  Data Reviewed  Assessment  Chronic hidradenitis involving the right axilla and left groin  Plan  Wide excision of these 2 areas is recommended for local control. Again, I explained the diagnosis of hidradenitis and the fact this is not a curable disease. Surgery is for local control. I believe she is a candidate for this. I explained the risks of surgery which includes but is not limited to bleeding, infection, having a chronic open wound, etc. I will continue her oral clindamycin as well as prescribed clindamycin gel preoperatively. Surgery will be scheduled

## 2013-07-31 ENCOUNTER — Encounter (HOSPITAL_COMMUNITY): Payer: Self-pay | Admitting: Emergency Medicine

## 2013-07-31 ENCOUNTER — Encounter (HOSPITAL_COMMUNITY): Payer: 59 | Admitting: Certified Registered Nurse Anesthetist

## 2013-07-31 ENCOUNTER — Observation Stay (HOSPITAL_COMMUNITY)
Admission: EM | Admit: 2013-07-31 | Discharge: 2013-08-01 | Disposition: A | Discharge status: Home / Self Care | Payer: 59 | Attending: Surgery | Admitting: Surgery

## 2013-07-31 ENCOUNTER — Encounter (HOSPITAL_COMMUNITY)
Admission: RE | Disposition: A | Discharge status: Home / Self Care | Payer: Self-pay | Source: Ambulatory Visit | Attending: Surgery

## 2013-07-31 ENCOUNTER — Ambulatory Visit (HOSPITAL_COMMUNITY): Payer: 59 | Admitting: Certified Registered Nurse Anesthetist

## 2013-07-31 ENCOUNTER — Ambulatory Visit (HOSPITAL_COMMUNITY)
Admission: RE | Admit: 2013-07-31 | Discharge: 2013-07-31 | Disposition: A | Discharge status: Home / Self Care | Payer: 59 | Source: Ambulatory Visit | Attending: Surgery | Admitting: Surgery

## 2013-07-31 ENCOUNTER — Encounter (HOSPITAL_COMMUNITY): Payer: Self-pay | Admitting: Certified Registered Nurse Anesthetist

## 2013-07-31 DIAGNOSIS — F17200 Nicotine dependence, unspecified, uncomplicated: Secondary | ICD-10-CM | POA: Insufficient documentation

## 2013-07-31 DIAGNOSIS — Y838 Other surgical procedures as the cause of abnormal reaction of the patient, or of later complication, without mention of misadventure at the time of the procedure: Secondary | ICD-10-CM | POA: Insufficient documentation

## 2013-07-31 DIAGNOSIS — L732 Hidradenitis suppurativa: Secondary | ICD-10-CM

## 2013-07-31 DIAGNOSIS — IMO0002 Reserved for concepts with insufficient information to code with codable children: Principal | ICD-10-CM | POA: Diagnosis present

## 2013-07-31 DIAGNOSIS — K449 Diaphragmatic hernia without obstruction or gangrene: Secondary | ICD-10-CM | POA: Insufficient documentation

## 2013-07-31 DIAGNOSIS — M129 Arthropathy, unspecified: Secondary | ICD-10-CM | POA: Insufficient documentation

## 2013-07-31 DIAGNOSIS — D62 Acute posthemorrhagic anemia: Secondary | ICD-10-CM | POA: Insufficient documentation

## 2013-07-31 DIAGNOSIS — K219 Gastro-esophageal reflux disease without esophagitis: Secondary | ICD-10-CM | POA: Insufficient documentation

## 2013-07-31 DIAGNOSIS — R51 Headache: Secondary | ICD-10-CM | POA: Insufficient documentation

## 2013-07-31 HISTORY — PX: HYDRADENITIS EXCISION: SHX5243

## 2013-07-31 SURGERY — EXCISION, HIDRADENITIS, AXILLA
Anesthesia: General | Site: Groin | Laterality: Bilateral

## 2013-07-31 MED ORDER — LIDOCAINE HCL (CARDIAC) 20 MG/ML IV SOLN
INTRAVENOUS | Status: DC | PRN
  Administered 2013-07-31: 70 mg via INTRAVENOUS

## 2013-07-31 MED ORDER — HYDROMORPHONE HCL PF 1 MG/ML IJ SOLN: 0.2500 mg | INTRAMUSCULAR | Status: DC | PRN

## 2013-07-31 MED ORDER — LIDOCAINE HCL (CARDIAC) 20 MG/ML IV SOLN
INTRAVENOUS | Status: AC
Start: 2013-07-31 — End: 2013-07-31
  Filled 2013-07-31: qty 5

## 2013-07-31 MED ORDER — OXYCODONE HCL 5 MG PO TABS: 5.0000 mg | ORAL_TABLET | Freq: Once | ORAL | Status: DC | PRN

## 2013-07-31 MED ORDER — OXYCODONE HCL 5 MG PO TABS
5.0000 mg | ORAL_TABLET | ORAL | Status: DC | PRN
Start: 2013-07-31 — End: 2013-07-31

## 2013-07-31 MED ORDER — MORPHINE SULFATE 2 MG/ML IJ SOLN
2.0000 mg | INTRAMUSCULAR | Status: DC | PRN
  Administered 2013-07-31: 2 mg via INTRAVENOUS
  Filled 2013-07-31: qty 1

## 2013-07-31 MED ORDER — ACETAMINOPHEN 325 MG PO TABS: 650.0000 mg | ORAL_TABLET | Freq: Four times a day (QID) | ORAL | Status: DC | PRN

## 2013-07-31 MED ORDER — OXYCODONE HCL 5 MG/5ML PO SOLN: 5.0000 mg | Freq: Once | ORAL | Status: DC | PRN

## 2013-07-31 MED ORDER — FENTANYL CITRATE 0.05 MG/ML IJ SOLN
INTRAMUSCULAR | Status: DC | PRN
  Administered 2013-07-31 (×3): 25 ug via INTRAVENOUS

## 2013-07-31 MED ORDER — 0.9 % SODIUM CHLORIDE (POUR BTL) OPTIME
TOPICAL | Status: DC | PRN
  Administered 2013-07-31: 1000 mL

## 2013-07-31 MED ORDER — MIDAZOLAM HCL 2 MG/2ML IJ SOLN
INTRAMUSCULAR | Status: AC
  Filled 2013-07-31: qty 2

## 2013-07-31 MED ORDER — MIDAZOLAM HCL 5 MG/5ML IJ SOLN
INTRAMUSCULAR | Status: DC | PRN
  Administered 2013-07-31: 2 mg via INTRAVENOUS

## 2013-07-31 MED ORDER — FENTANYL CITRATE 0.05 MG/ML IJ SOLN
INTRAMUSCULAR | Status: AC
  Filled 2013-07-31: qty 5

## 2013-07-31 MED ORDER — PROPOFOL 10 MG/ML IV BOLUS
INTRAVENOUS | Status: DC | PRN
  Administered 2013-07-31: 200 mg via INTRAVENOUS
  Administered 2013-07-31 (×2): 50 mg via INTRAVENOUS

## 2013-07-31 MED ORDER — HYDROCODONE-ACETAMINOPHEN 7.5-325 MG PO TABS: 1.0000 | ORAL_TABLET | ORAL | Status: DC | PRN

## 2013-07-31 MED ORDER — KETOROLAC TROMETHAMINE 30 MG/ML IJ SOLN
INTRAMUSCULAR | Status: AC
  Filled 2013-07-31: qty 1

## 2013-07-31 MED ORDER — CLINDAMYCIN HCL 300 MG PO CAPS: 300.0000 mg | ORAL_CAPSULE | Freq: Three times a day (TID) | ORAL | Status: DC

## 2013-07-31 MED ORDER — BUPIVACAINE-EPINEPHRINE (PF) 0.5% -1:200000 IJ SOLN
INTRAMUSCULAR | Status: AC
  Filled 2013-07-31: qty 10

## 2013-07-31 MED ORDER — ONDANSETRON HCL 4 MG/2ML IJ SOLN
INTRAMUSCULAR | Status: AC
  Filled 2013-07-31: qty 2

## 2013-07-31 MED ORDER — ONDANSETRON HCL 4 MG/2ML IJ SOLN
4.0000 mg | Freq: Four times a day (QID) | INTRAMUSCULAR | Status: DC | PRN
Start: 2013-07-31 — End: 2013-08-01
  Administered 2013-07-31: 4 mg via INTRAVENOUS

## 2013-07-31 MED ORDER — HYDROCODONE-ACETAMINOPHEN 5-325 MG PO TABS
1.0000 | ORAL_TABLET | ORAL | Status: DC | PRN
  Administered 2013-08-01 (×2): 2 via ORAL
  Filled 2013-07-31 (×2): qty 2

## 2013-07-31 MED ORDER — PROPOFOL 10 MG/ML IV BOLUS
INTRAVENOUS | Status: AC
  Filled 2013-07-31: qty 20

## 2013-07-31 MED ORDER — KETOROLAC TROMETHAMINE 30 MG/ML IJ SOLN
INTRAMUSCULAR | Status: DC | PRN
  Administered 2013-07-31: 30 mg via INTRAVENOUS

## 2013-07-31 MED ORDER — LACTATED RINGERS IV SOLN
INTRAVENOUS | Status: DC
  Administered 2013-07-31: 16:00:00 via INTRAVENOUS

## 2013-07-31 MED ORDER — ONDANSETRON HCL 4 MG/2ML IJ SOLN: 4.0000 mg | Freq: Four times a day (QID) | INTRAMUSCULAR | Status: DC | PRN

## 2013-07-31 MED ORDER — ONDANSETRON HCL 4 MG/2ML IJ SOLN
INTRAMUSCULAR | Status: DC | PRN
  Administered 2013-07-31: 4 mg via INTRAVENOUS

## 2013-07-31 MED ORDER — ACETAMINOPHEN 650 MG RE SUPP: 650.0000 mg | Freq: Four times a day (QID) | RECTAL | Status: DC | PRN

## 2013-07-31 MED ORDER — POTASSIUM CHLORIDE IN NACL 20-0.9 MEQ/L-% IV SOLN
INTRAVENOUS | Status: DC
  Administered 2013-08-01: 03:00:00 via INTRAVENOUS
  Filled 2013-07-31 (×4): qty 1000

## 2013-07-31 MED ORDER — BUPIVACAINE-EPINEPHRINE 0.5% -1:200000 IJ SOLN
INTRAMUSCULAR | Status: DC | PRN
  Administered 2013-07-31: 30 mL

## 2013-07-31 MED ORDER — MORPHINE SULFATE 4 MG/ML IJ SOLN: 4.0000 mg | INTRAMUSCULAR | Status: DC | PRN

## 2013-07-31 MED ORDER — LACTATED RINGERS IV SOLN
INTRAVENOUS | Status: DC | PRN
  Administered 2013-07-31 (×2): via INTRAVENOUS

## 2013-07-31 MED ORDER — CLINDAMYCIN PHOSPHATE 1 % EX SOLN: 1.0000 "application " | Freq: Two times a day (BID) | CUTANEOUS | Status: DC

## 2013-07-31 MED ORDER — ROCURONIUM BROMIDE 50 MG/5ML IV SOLN
INTRAVENOUS | Status: AC
  Filled 2013-07-31: qty 1

## 2013-07-31 MED ORDER — OXYCODONE HCL 5 MG PO TABS: 5.0000 mg | ORAL_TABLET | ORAL | Status: DC | PRN

## 2013-07-31 MED ORDER — ONDANSETRON HCL 4 MG/2ML IJ SOLN
4.0000 mg | Freq: Four times a day (QID) | INTRAMUSCULAR | Status: DC | PRN
  Filled 2013-07-31: qty 2

## 2013-07-31 SURGICAL SUPPLY — 39 items
APL SKNCLS STERI-STRIP NONHPOA (GAUZE/BANDAGES/DRESSINGS)
BENZOIN TINCTURE PRP APPL 2/3 (GAUZE/BANDAGES/DRESSINGS) IMPLANT
CANISTER SUCTION 2500CC (MISCELLANEOUS) ×2 IMPLANT
CHLORAPREP W/TINT 26ML (MISCELLANEOUS) ×2 IMPLANT
COVER SURGICAL LIGHT HANDLE (MISCELLANEOUS) ×2 IMPLANT
DECANTER SPIKE VIAL GLASS SM (MISCELLANEOUS) ×2 IMPLANT
DRAPE LAPAROSCOPIC ABDOMINAL (DRAPES) IMPLANT
DRAPE PED LAPAROTOMY (DRAPES) ×2 IMPLANT
DRAPE PROXIMA HALF (DRAPES) ×1 IMPLANT
DRAPE UTILITY 15X26 W/TAPE STR (DRAPE) ×4 IMPLANT
ELECT CAUTERY BLADE 6.4 (BLADE) ×2 IMPLANT
ELECT REM PT RETURN 9FT ADLT (ELECTROSURGICAL) ×2
ELECTRODE REM PT RTRN 9FT ADLT (ELECTROSURGICAL) ×1 IMPLANT
GLOVE BIOGEL PI IND STRL 7.0 (GLOVE) IMPLANT
GLOVE BIOGEL PI INDICATOR 7.0 (GLOVE) ×1
GLOVE SURG SIGNA 7.5 PF LTX (GLOVE) ×2 IMPLANT
GLOVE SURG SS PI 7.5 STRL IVOR (GLOVE) ×1 IMPLANT
GOWN STRL NON-REIN LRG LVL3 (GOWN DISPOSABLE) ×2 IMPLANT
GOWN STRL REIN XL XLG (GOWN DISPOSABLE) ×2 IMPLANT
KIT BASIN OR (CUSTOM PROCEDURE TRAY) ×2 IMPLANT
KIT ROOM TURNOVER OR (KITS) ×2 IMPLANT
NDL HYPO 25GX1X1/2 BEV (NEEDLE) ×1 IMPLANT
NEEDLE HYPO 25GX1X1/2 BEV (NEEDLE) ×2 IMPLANT
NS IRRIG 1000ML POUR BTL (IV SOLUTION) ×2 IMPLANT
PACK GENERAL/GYN (CUSTOM PROCEDURE TRAY) ×2 IMPLANT
PAD ARMBOARD 7.5X6 YLW CONV (MISCELLANEOUS) ×2 IMPLANT
SPECIMEN JAR MEDIUM (MISCELLANEOUS) ×2 IMPLANT
SPONGE GAUZE 4X4 12PLY (GAUZE/BANDAGES/DRESSINGS) IMPLANT
SPONGE GAUZE 4X4 12PLY STER LF (GAUZE/BANDAGES/DRESSINGS) ×2 IMPLANT
STRIP CLOSURE SKIN 1/2X4 (GAUZE/BANDAGES/DRESSINGS) IMPLANT
SUT ETHILON 2 0 FS 18 (SUTURE) ×6 IMPLANT
SUT ETHILON 3 0 FSL (SUTURE) IMPLANT
SUT MNCRL AB 4-0 PS2 18 (SUTURE) IMPLANT
SUT VIC AB 3-0 SH 18 (SUTURE) ×2 IMPLANT
SYR CONTROL 10ML LL (SYRINGE) ×2 IMPLANT
TAPE CLOTH SURG 4X10 WHT LF (GAUZE/BANDAGES/DRESSINGS) ×2 IMPLANT
TOWEL OR 17X24 6PK STRL BLUE (TOWEL DISPOSABLE) ×2 IMPLANT
TOWEL OR 17X26 10 PK STRL BLUE (TOWEL DISPOSABLE) ×2 IMPLANT
TOWEL OR NON WOVEN STRL DISP B (DISPOSABLE) ×1 IMPLANT

## 2013-07-31 NOTE — H&P (Signed)
Now does not appear to be actively bleeding and there is no large hematoma Will place in observation. If rebleeds, will take back to OR

## 2013-07-31 NOTE — Op Note (Signed)
EXCISION HYDRADENITIS RIGHT AXILLA  AND LEFT GROIN   Procedure Note  Elizabeth Burke 07/31/2013   Pre-op Diagnosis: HIDRADENTITIS      Post-op Diagnosis: same  Procedure(s): EXCISION HYDRADENITIS RIGHT AXILLA  AND LEFT GROIN (8cm axilla, 10cm groin)  Surgeon(s): Shelly Rubensteinouglas A Daylin Gruszka, MD  Anesthesia: General  Staff:  Circulator: Gerre PebblesJamie Elizabeth Sipsis, RN Scrub Person: Denese KillingsMichael Vincent Pingue, CST Circulator Assistant: Jani FilesKasi C Shay, RN  Estimated Blood Loss: Minimal               Specimens: sent to path          [facility] InstituteBLACKMAN,Elizabeth Spangle A   Date: 07/31/2013  Time: 8:29 AM

## 2013-07-31 NOTE — Discharge Instructions (Signed)
Ok to shower starting tomorrow  Over with dry bandage  Expect drainage  Ice pack and ibuprofen also for pain

## 2013-07-31 NOTE — ED Notes (Signed)
Pt reports she had abscess removed from Right axillary and Left inner thigh at 0730 this am, pt states she began having heavy bleeding from her Left inner thigh approx 1 hour ago

## 2013-07-31 NOTE — H&P (Signed)
  Chief Complaint: post operative bleeding  HPI: Elizabeth Burke is a 28 year old female who underwent excision of hidradenitis to right axilla and left groin this morning with Dr. Magnus IvanBlackman. She remained stable.  She was subsequently discharged home.  She reports upon returning home she had soaked through her pants and onto the car seat with blood.  When her mom returned from the pharmacy, she came to the ED.  She is hemodynamically normal and in no acute distress.  Past Medical History  Diagnosis Date  . Urinary tract infection   . Allergy   . Arthritis   . Neuromuscular disorder   . GERD (gastroesophageal reflux disease)   . H/O hiatal hernia   . ZOXWRUEA(540.9Headache(784.0)     Past Surgical History  Procedure Laterality Date  . Nose surgery    . Fracture surgery      Left wrist    Family History  Problem Relation Age of Onset  . Cancer Maternal Grandmother     bladder and liver  . Diabetes Maternal Grandfather   . Cancer Maternal Grandfather     prostate  . Diabetes Paternal Grandmother    Social History:  reports that she has been smoking.  She has never used smokeless tobacco. She reports that she drinks alcohol. She reports that she uses illicit drugs (Marijuana).  Allergies: No Known Allergies  Meds: none  (Not in a hospital admission)  No results found for this or any previous visit (from the past 48 hour(s)). No results found.  Review of Systems  Constitutional: Negative for diaphoresis.       Denies fatigue  Respiratory: Negative for shortness of breath.   Cardiovascular: Negative for chest pain and palpitations.  Neurological: Negative for weakness.    Blood pressure 119/69, pulse 95, temperature 97.8 F (36.6 C), temperature source Oral, resp. rate 18, last menstrual period 07/31/2013, SpO2 100.00%. Physical Exam  Constitutional: She is oriented to person, place, and time. She appears well-developed and well-nourished. No distress.  Cardiovascular: Normal rate,  regular rhythm, normal heart sounds and intact distal pulses.   Respiratory: Effort normal and breath sounds normal. No respiratory distress. She has no rales.  GI: Soft. Bowel sounds are normal. She exhibits no distension. There is no tenderness.  Musculoskeletal: She exhibits no edema.  Neurological: She is alert and oriented to person, place, and time.  Skin: Skin is warm and dry. She is not diaphoretic. No pallor.  Right axilla-small amount of oozing, sutures are intact, no hematoma. Left groin-hemostasis achieved, old blood seen, dressing applied  Psychiatric: She has a normal mood and affect. Her behavior is normal. Judgment and thought content normal.     Assessment/Plan S/p excision of hidradenitis to right axilla and left groin  Admit to observation, likely to OR later today for washout with Dr. Magnus IvanBlackman.   NPO IVF IV and PO pain medication Apply dry pressure dressing to both sites SCDs, no chemical anticoagulation   Elizabeth Burke ANP-BC 07/31/2013, 2:17 PM

## 2013-07-31 NOTE — ED Notes (Signed)
Spoke with flow manager about delay in bed assignment.

## 2013-07-31 NOTE — Anesthesia Procedure Notes (Signed)
Procedure Name: LMA Insertion Date/Time: 07/31/2013 7:38 AM Performed by: Vita BarleyURNER, Selso Mannor E Pre-anesthesia Checklist: Patient identified, Emergency Drugs available, Suction available and Patient being monitored Patient Re-evaluated:Patient Re-evaluated prior to inductionOxygen Delivery Method: Circle system utilized Preoxygenation: Pre-oxygenation with 100% oxygen Intubation Type: IV induction Ventilation: Mask ventilation without difficulty LMA: LMA inserted LMA Size: 4.0 Number of attempts: 1 Placement Confirmation: positive ETCO2 and breath sounds checked- equal and bilateral Tube secured with: Tape Dental Injury: Teeth and Oropharynx as per pre-operative assessment

## 2013-07-31 NOTE — Anesthesia Preprocedure Evaluation (Addendum)
Anesthesia Evaluation  Patient identified by MRN, date of birth, ID band Patient awake    Reviewed: Allergy & Precautions, H&P , NPO status , Patient's Chart, lab work & pertinent test results  Airway Mallampati: II TM Distance: >3 FB Neck ROM: full    Dental  (+) Dental Advisory Given and Teeth Intact   Pulmonary Current Smoker,          Cardiovascular negative cardio ROS      Neuro/Psych  Headaches,    GI/Hepatic hiatal hernia, GERD-  ,  Endo/Other    Renal/GU      Musculoskeletal  (+) Arthritis -,   Abdominal   Peds  Hematology   Anesthesia Other Findings   Reproductive/Obstetrics                         Anesthesia Physical Anesthesia Plan  ASA: II  Anesthesia Plan: General   Post-op Pain Management:    Induction: Intravenous  Airway Management Planned: LMA  Additional Equipment:   Intra-op Plan:   Post-operative Plan: Extubation in OR  Informed Consent: I have reviewed the patients History and Physical, chart, labs and discussed the procedure including the risks, benefits and alternatives for the proposed anesthesia with the patient or authorized representative who has indicated his/her understanding and acceptance.   Dental advisory given  Plan Discussed with: CRNA, Anesthesiologist and Surgeon  Anesthesia Plan Comments:        Anesthesia Quick Evaluation

## 2013-07-31 NOTE — ED Notes (Signed)
Pt up the bathroom had a near syncope episode coming out.. Lasted for few min BP 107/65, sat 100%/RA, Hr 99 and temp 98.8. Pt resting  well now, denies any  Deny any need. Family at bedside.

## 2013-07-31 NOTE — Interval H&P Note (Signed)
History and Physical Interval Note: no change in H and P  07/31/2013 6:40 AM  Elizabeth Burke  has presented today for surgery, with the diagnosis of HIDRADENTITIS   The various methods of treatment have been discussed with the patient and family. After consideration of risks, benefits and other options for treatment, the patient has consented to  Procedure(s): EXCISION HYDRADENITIS RIGHT AXILLA  AND LEFT GROIN  (Bilateral) as Burke surgical intervention .  The patient's history has been reviewed, patient examined, no change in status, stable for surgery.  I have reviewed the patient's chart and labs.  Questions were answered to the patient's satisfaction.     Elizabeth Burke

## 2013-07-31 NOTE — Preoperative (Signed)
Beta Blockers   Reason not to administer Beta Blockers:Not Applicable 

## 2013-07-31 NOTE — Transfer of Care (Signed)
Immediate Anesthesia Transfer of Care Note  Patient: Elizabeth Burke  Procedure(s) Performed: Procedure(s): EXCISION HYDRADENITIS RIGHT AXILLA  AND LEFT GROIN  (Bilateral)  Patient Location: PACU  Anesthesia Type:General  Level of Consciousness: awake and alert   Airway & Oxygen Therapy: Patient Spontanous Breathing and Patient connected to nasal cannula oxygen  Post-op Assessment: Report given to PACU RN, Post -op Vital signs reviewed and stable and Patient moving all extremities X 4  Post vital signs: Reviewed and stable  Complications: No apparent anesthesia complications

## 2013-07-31 NOTE — Anesthesia Postprocedure Evaluation (Signed)
Anesthesia Post Note  Patient: Elizabeth Burke  Procedure(s) Performed: Procedure(s) (LRB): EXCISION HYDRADENITIS RIGHT AXILLA  AND LEFT GROIN  (Bilateral)  Anesthesia type: General  Patient location: PACU  Post pain: Pain level controlled and Adequate analgesia  Post assessment: Post-op Vital signs reviewed, Patient's Cardiovascular Status Stable, Respiratory Function Stable, Patent Airway and Pain level controlled  Last Vitals:  Filed Vitals:   07/31/13 0933  BP:   Pulse:   Temp: 36.5 C  Resp:     Post vital signs: Reviewed and stable  Level of consciousness: awake, alert  and oriented  Complications: No apparent anesthesia complications

## 2013-07-31 NOTE — ED Notes (Signed)
Suture cart at bedside 

## 2013-08-01 LAB — CBC
HEMATOCRIT: 24.9 % — AB (ref 36.0–46.0)
Hemoglobin: 8.1 g/dL — ABNORMAL LOW (ref 12.0–15.0)
MCH: 24.5 pg — ABNORMAL LOW (ref 26.0–34.0)
MCHC: 32.5 g/dL (ref 30.0–36.0)
MCV: 75.5 fL — AB (ref 78.0–100.0)
Platelets: 278 10*3/uL (ref 150–400)
RBC: 3.3 MIL/uL — ABNORMAL LOW (ref 3.87–5.11)
RDW: 14.6 % (ref 11.5–15.5)
WBC: 7.2 10*3/uL (ref 4.0–10.5)

## 2013-08-01 MED ORDER — PNEUMOCOCCAL VAC POLYVALENT 25 MCG/0.5ML IJ INJ: 0.5000 mL | INJECTION | INTRAMUSCULAR | Status: DC

## 2013-08-01 MED ORDER — INFLUENZA VAC SPLIT QUAD 0.5 ML IM SUSP: 0.5000 mL | INTRAMUSCULAR | Status: DC

## 2013-08-01 NOTE — Discharge Summary (Signed)
Physician Discharge Summary  Patient ID: Elizabeth Burke MRN: [id number] DOB/AGE: [birth year] 27 y.o.  Admit date: 07/31/2013 Discharge date: 08/01/2013  Admission Diagnoses:  Discharge Diagnoses:  Active Problems:   Hidradenitis   Post-operative hemorrhage postop blood loss anemia  Discharged Condition: good  Hospital Course: admitted after developing postop bleeding around 3 hours after surgery.  Bleed a fair amount.  Basically stopped on own without need for surgery.  POD#1 she showed no further signs of bleeding and was discharge home.  Did have a drop in Hgb from preop  Consults: None  Significant Diagnostic Studies:   Treatments: IV hydration  Discharge Exam: Blood pressure 115/65, pulse 84, temperature 98.6 F (37 C), temperature source Oral, resp. rate 18, height 5' 7.76" (1.721 m), weight 178 lb 11.2 oz (81.058 kg), last menstrual period 07/31/2013, SpO2 100.00%. Incision/Wound: minimal hematoma in axilla and groin with normal post op swelling.  Disposition: 01-Home or Self Care   Future Appointments Provider Department Dept Phone   08/13/2013 9:10 AM Shelly Rubensteinouglas A Tarrie Mcmichen, MD [facility] [place] [phone]   08/21/2013 11:00 AM Doris Cheadleeepak Advani, MD [facility] [phone]       Medication List    Notice   You have not been prescribed any medications.         Follow-up Information   [name]. Call in 2 weeks. ([facility])    Specialty:  General Surgery   Contact information:   [address] [place] [address] [phone]       Signed: Shelly RubensteinBLACKMAN,Kiernan Farkas A 08/01/2013, 8:03 AM

## 2013-08-01 NOTE — Progress Notes (Signed)
Patient ID: Elizabeth Burke, female   DOB: 1986-04-12, 28 y.o.   MRN: [id number]  Doing well No further bleeding  Will discharge home

## 2013-08-01 NOTE — Discharge Instructions (Signed)
Call office for any problems  Ice pack to both areas as needed  Ok to shower

## 2013-08-01 NOTE — Op Note (Signed)
Elizabeth Burke:  Burke, Elizabeth             ACCOUNT NO.:  [id number]  MEDICAL RECORD NO.:  [id number]  LOCATION:  [id number]                        FACILITY:  [facility]  PHYSICIAN:  Elizabeth Miyamotoouglas Alura Olveda, M.D. DATE OF BIRTH:  10/17/1985  DATE OF PROCEDURE:  07/31/2013 DATE OF DISCHARGE:                              OPERATIVE REPORT   PREOPERATIVE DIAGNOSIS:  Chronic hidradenitis.  POSTOPERATIVE DIAGNOSIS:  Chronic hidradenitis.  PROCEDURE:  An 8-cm wide excision of right axillary hidradenitis and 10- cm wide excision of left groin hidradenitis.  SURGEON:  Elizabeth Miyamotoouglas Kharee Lesesne, M.D.  ANESTHESIA:  General and 0.5% Marcaine with epinephrine.  ESTIMATED BLOOD LOSS:  Minimal.  INDICATIONS:  This is a 28 year old female with significant hidradenitis in both her right axilla and left groin.  She has been on chronic antibiotics.  Because of areas of chronic drainage, the decision had been made to proceed with wide excision of these areas.  PROCEDURE IN DETAIL:  The patient was brought to operating room, identified as Elizabeth Burke.  She was placed supine on the operating table where general anesthesia was induced.  Her right axilla was then prepped and draped in usual sterile fashion.  I anesthetized the skin around the multiple chronic draining areas with 0.5% Marcaine with epinephrine.  I then performed a wide elliptical incision approximately 8 cm in size with electrocautery.  I took this down to the axillary tissue with electrocautery and excised all the chronic draining sinus tissue.  Healthy tissue was found underneath all these areas.  I anesthetized the area further with Marcaine and achieved hemostasis with cautery.  I then closed the wound with interrupted 2-0 nylon sutures. Next, the patient was placed in a frog-leg position.  Her left groin was then prepped and draped in usual sterile fashion.  I performed a 10-cm wide excision with the scalpel after I anesthetized the skin with Marcaine in the  left groin.  I took this down to the subcutaneous tissue with electrocautery.  Again I performed a wide excision removing all the chronic draining sinus tracts.  Again healthy tissue underneath was identified and was found circumferentially.  I anesthetized the wound further with Marcaine.  I achieved hemostasis with cautery and I closed the wound with interrupted 2-0 nylon sutures.  Gauze and tape were then applied to both wounds.  The patient tolerated the procedure well.  All the counts were correct at the end of procedure.  The patient was then extubated in the operating room and taken in a stable condition to the recovery room.     Elizabeth Burke, M.D.     DB/MEDQ  D:  07/31/2013  T:  08/01/2013  Job:  [id number]

## 2013-08-02 ENCOUNTER — Encounter (HOSPITAL_COMMUNITY): Payer: Self-pay | Admitting: Surgery

## 2013-08-04 ENCOUNTER — Telehealth (INDEPENDENT_AMBULATORY_CARE_PROVIDER_SITE_OTHER): Payer: Self-pay | Admitting: General Surgery

## 2013-08-04 NOTE — Telephone Encounter (Signed)
Pt called about her groin hidradenitis incision. She had surgery Tuesday.  States that one of the sutures came out.  She is not having bleeding or redness of incision.  I advised her that I would not come to ED.  I would advise her to keep covered if she is dressed.  I told her it was OK to leave it open if she is not dressed.  She states that pain is OK, she denies redness of the wound.  She describes some drainage as clear/bloody.  She is not having fever or chills.  I asked her to call office Monday morning if she is having worsening of the wound.

## 2013-08-06 ENCOUNTER — Telehealth (INDEPENDENT_AMBULATORY_CARE_PROVIDER_SITE_OTHER): Payer: Self-pay | Admitting: General Surgery

## 2013-08-06 NOTE — Telephone Encounter (Signed)
Called patient this morning to check on her and she stated that she is doing well and she only had one suture to come out. No fever, the drainage is still clear/bloody. But she will hold on for coming in to see Dr Magnus IvanBlackman., her apt is on 08-13-2013. But I did tell her that if this area gets worse to call me and I will get her in to see Dr Magnus IvanBlackman sooner or get her see one of his partners

## 2013-08-07 ENCOUNTER — Telehealth (INDEPENDENT_AMBULATORY_CARE_PROVIDER_SITE_OTHER): Payer: Self-pay | Admitting: General Surgery

## 2013-08-07 NOTE — Telephone Encounter (Signed)
Pt's mother called with question about daughter's wound that is opening as sutures break.  She had hidradenitis surgery; [facility]  Mother's concern that what was sutured closed should be resutured, or reclosed.  Explained that once it opens, it should be kept clean and as dry as possible, using antibacterial soap and flushed with warm water in the shower.  Reminded her how common household knife cuts heal at home without sutures, healing from the inside out.  This is the same situation now.  Mother now understands and is comfortable with this answer.  Daughter will call in the am if she wants the MD to check it at Urgent office.

## 2013-08-08 NOTE — Telephone Encounter (Signed)
Patient called today with the same questions regarding whether it was ok that her sutures have come out and the wound is open.  I explained that the wound will still heal and it is important just to keep it clean and dry.  Explained to patient to use a dressing when necessary due to bleeding.  Explained that signs and symptoms to watch for that would show infection.  Patient states understanding and agreeable at this time.

## 2013-08-13 ENCOUNTER — Encounter (INDEPENDENT_AMBULATORY_CARE_PROVIDER_SITE_OTHER): Payer: Self-pay | Admitting: Surgery

## 2013-08-13 ENCOUNTER — Ambulatory Visit (INDEPENDENT_AMBULATORY_CARE_PROVIDER_SITE_OTHER): Payer: 59 | Admitting: Surgery

## 2013-08-13 VITALS — BP 112/70 | HR 116 | Temp 98.5°F | Resp 15 | Ht 67.75 in | Wt 169.8 lb

## 2013-08-13 DIAGNOSIS — Z09 Encounter for follow-up examination after completed treatment for conditions other than malignant neoplasm: Secondary | ICD-10-CM

## 2013-08-13 MED ORDER — OXYCODONE-ACETAMINOPHEN 7.5-325 MG PO TABS: 1.0000 | ORAL_TABLET | ORAL | Status: DC | PRN

## 2013-08-13 NOTE — Progress Notes (Signed)
Subjective:     Patient ID: Elizabeth Burke, female   DOB: December 27, 1985, 28 y.o.   MRN: [id number]  HPI She is here for her first postop visit status post wide excision of hidradenitis in her axilla and groin.  Review of Systems     Objective:   Physical Exam On exam, she is at significant wound separation in both the thigh and the axilla. I removed both sutures from the areas and remove hematoma from the groin    Assessment:     Patient status post wide excision of hidradenitis     Plan:     She will start wet-to-dry dressing changes. I Will renew her Percocet. I will see her back next week

## 2013-08-21 ENCOUNTER — Inpatient Hospital Stay: Payer: Self-pay | Admitting: Internal Medicine

## 2013-08-24 ENCOUNTER — Encounter (INDEPENDENT_AMBULATORY_CARE_PROVIDER_SITE_OTHER): Payer: Self-pay | Admitting: Surgery

## 2013-08-24 ENCOUNTER — Ambulatory Visit (INDEPENDENT_AMBULATORY_CARE_PROVIDER_SITE_OTHER): Payer: 59 | Admitting: Surgery

## 2013-08-24 DIAGNOSIS — Z09 Encounter for follow-up examination after completed treatment for conditions other than malignant neoplasm: Secondary | ICD-10-CM

## 2013-08-24 NOTE — Progress Notes (Signed)
Subjective:     Patient ID: Elizabeth Burke, female   DOB: [birth year], 28 y.o.   MRN: [id number]  HPI She is here for another postoperative visit. She is doing wet-to-dry dressing changes to her right axilla and left inner thigh. She is doing well and has minimal complaints  Review of Systems     Objective:   Physical Exam  on exam, both areas show excellent granulation tissue and are continuing to contract.I treated the axilla with silver nitrate    Assessment:     Patient stable postop     Plan:     She will be returning to work soon.  I will see her back in 4 weeks. She will continue her current wound care.

## 2013-09-24 ENCOUNTER — Ambulatory Visit (INDEPENDENT_AMBULATORY_CARE_PROVIDER_SITE_OTHER): Payer: 59 | Admitting: Surgery

## 2013-09-24 VITALS — BP 132/72 | HR 75 | Temp 98.2°F | Resp 16 | Ht 67.0 in | Wt 170.0 lb

## 2013-09-24 DIAGNOSIS — Z09 Encounter for follow-up examination after completed treatment for conditions other than malignant neoplasm: Secondary | ICD-10-CM

## 2013-09-24 MED ORDER — OXYCODONE-ACETAMINOPHEN 5-325 MG PO TABS: 1.0000 | ORAL_TABLET | ORAL | Status: DC | PRN

## 2013-09-24 NOTE — Progress Notes (Signed)
Subjective:     Patient ID: Elizabeth Burke, female   DOB: 1986/04/19, 28 y.o.   MRN: [id number]  HPI She is here for another postoperative visit. She is having significant pain in her right axilla.  Review of Systems     Objective:   Physical Exam On exam, the thigh wound is on is completely healed. I did treat a small open area with several nitrate. The right axillary wound the superficial that is approximately 5 cm around in size. There is no erythema it is significantly tender.  I placed the a-cell xenograft power into the wound    Assessment:     Nonhealing surgical wound in the right axilla     Plan:     I will see her next week and we'll place the xenograft sheet on the wound to hopefully enhance the wound healing

## 2013-10-01 ENCOUNTER — Encounter (INDEPENDENT_AMBULATORY_CARE_PROVIDER_SITE_OTHER): Payer: Self-pay | Admitting: Surgery

## 2013-10-01 ENCOUNTER — Ambulatory Visit (INDEPENDENT_AMBULATORY_CARE_PROVIDER_SITE_OTHER): Payer: 59 | Admitting: Surgery

## 2013-10-01 VITALS — BP 126/76 | HR 75 | Temp 97.9°F | Resp 16 | Ht 67.0 in | Wt 168.2 lb

## 2013-10-01 DIAGNOSIS — Z09 Encounter for follow-up examination after completed treatment for conditions other than malignant neoplasm: Secondary | ICD-10-CM

## 2013-10-01 NOTE — Progress Notes (Signed)
Subjective:     Patient ID: Elizabeth Burke, female   DOB: 1985/10/03, 28 y.o.   MRN: [id number]  HPI She is here for another visit. She is still having drainage from the open wound in the right axilla  Review of Systems     Objective:   Physical Exam On exam, there is excellent granulation tissue. The wound is still 5 cm in size. This time, I placed a xenograft powder and a sheet    Assessment:     Patient stable postop with open wound in the right axilla     Plan:     She will continue her current wound care and I will see her back in 2 weeks

## 2013-10-04 ENCOUNTER — Other Ambulatory Visit: Payer: Self-pay | Admitting: Internal Medicine

## 2013-10-04 DIAGNOSIS — M25551 Pain in right hip: Secondary | ICD-10-CM

## 2013-10-15 ENCOUNTER — Encounter (INDEPENDENT_AMBULATORY_CARE_PROVIDER_SITE_OTHER): Payer: 59 | Admitting: Surgery

## 2013-10-16 ENCOUNTER — Ambulatory Visit (INDEPENDENT_AMBULATORY_CARE_PROVIDER_SITE_OTHER): Payer: 59 | Admitting: Surgery

## 2013-10-16 ENCOUNTER — Encounter (INDEPENDENT_AMBULATORY_CARE_PROVIDER_SITE_OTHER): Payer: Self-pay | Admitting: Surgery

## 2013-10-16 VITALS — BP 122/80 | HR 82 | Temp 98.7°F | Resp 16 | Ht 67.0 in | Wt 173.0 lb

## 2013-10-16 DIAGNOSIS — Z09 Encounter for follow-up examination after completed treatment for conditions other than malignant neoplasm: Secondary | ICD-10-CM

## 2013-10-16 MED ORDER — OXYCODONE-ACETAMINOPHEN 5-325 MG PO TABS: 1.0000 | ORAL_TABLET | ORAL | Status: DC | PRN

## 2013-10-16 NOTE — Progress Notes (Signed)
Subjective:     Patient ID: Elizabeth Burke, female   DOB: 1986-02-11, 28 y.o.   MRN: [id number]  HPI She is here for another postop visit. She has no complaints other than discomfort in the axilla   Review of Systems     Objective:   Physical Exam On exam, the right axillary wound is now approximately 3-1/2-4 cm round with excellent granulation tissue. It remained superficial.  I placed a xenograft powder and sheath into the wound    Assessment:     Patient stable postop     Plan:     She will continue her current wound care and I will see her back in 2 weeks

## 2013-10-27 ENCOUNTER — Ambulatory Visit
Admission: RE | Admit: 2013-10-27 | Discharge: 2013-10-27 | Disposition: A | Discharge status: Home / Self Care | Payer: 59 | Source: Ambulatory Visit | Attending: Internal Medicine | Admitting: Internal Medicine

## 2013-10-27 DIAGNOSIS — M25551 Pain in right hip: Secondary | ICD-10-CM

## 2013-10-27 MED ORDER — GADOBENATE DIMEGLUMINE 529 MG/ML IV SOLN
16.0000 mL | Freq: Once | INTRAVENOUS | Status: AC | PRN
  Administered 2013-10-27: 16 mL via INTRAVENOUS

## 2013-11-07 ENCOUNTER — Ambulatory Visit (INDEPENDENT_AMBULATORY_CARE_PROVIDER_SITE_OTHER): Payer: 59 | Admitting: Surgery

## 2013-11-07 ENCOUNTER — Encounter (INDEPENDENT_AMBULATORY_CARE_PROVIDER_SITE_OTHER): Payer: Self-pay | Admitting: Surgery

## 2013-11-07 VITALS — BP 120/70 | HR 77 | Temp 98.6°F | Resp 18 | Ht 67.0 in | Wt 172.8 lb

## 2013-11-07 DIAGNOSIS — T8189XA Other complications of procedures, not elsewhere classified, initial encounter: Secondary | ICD-10-CM

## 2013-11-07 NOTE — Progress Notes (Signed)
Subjective:     Patient ID: Elizabeth Burke, female   DOB: July 10, 1985, 28 y.o.   MRN: [id number]  HPI She is here for another visit regarding her open wound in the right axilla. She has no discomfort and minimal drainage  Review of Systems     Objective:   Physical Exam On exam, the incision is now 3 cm x 3 cm and remained superficial with excellent granulation tissue. I treated with silver nitrate    Assessment:     Nonhealing surgical wound     Plan:     She will continue her current wound care and I'll see her back in several weeks. I made again applied xenograft powder at that time

## 2013-11-20 ENCOUNTER — Other Ambulatory Visit (INDEPENDENT_AMBULATORY_CARE_PROVIDER_SITE_OTHER): Payer: Self-pay | Admitting: Surgery

## 2013-11-27 ENCOUNTER — Encounter (INDEPENDENT_AMBULATORY_CARE_PROVIDER_SITE_OTHER): Payer: 59 | Admitting: Surgery

## 2013-12-19 ENCOUNTER — Encounter (INDEPENDENT_AMBULATORY_CARE_PROVIDER_SITE_OTHER): Payer: Self-pay | Admitting: Surgery

## 2013-12-19 ENCOUNTER — Encounter (INDEPENDENT_AMBULATORY_CARE_PROVIDER_SITE_OTHER): Payer: Self-pay | Admitting: General Surgery

## 2013-12-19 ENCOUNTER — Ambulatory Visit (INDEPENDENT_AMBULATORY_CARE_PROVIDER_SITE_OTHER): Payer: No Typology Code available for payment source | Admitting: Surgery

## 2013-12-19 VITALS — BP 118/70 | HR 90 | Temp 98.4°F | Resp 16 | Ht 67.75 in | Wt 178.4 lb

## 2013-12-19 DIAGNOSIS — T8189XA Other complications of procedures, not elsewhere classified, initial encounter: Secondary | ICD-10-CM

## 2013-12-19 MED ORDER — CLINDAMYCIN PHOSPHATE 1 % EX SOLN: Freq: Two times a day (BID) | CUTANEOUS | Status: DC

## 2013-12-19 NOTE — Progress Notes (Signed)
Subjective:     Patient ID: Elizabeth Burke, female   DOB: 1985-08-19, 28 y.o.   MRN: [id number]  HPI She is here for another evaluation of the chronic hidradenitis an open wound in her right axilla. She reports that she has had an area flareup in her left groin.  Review of Systems     Objective:   Physical Exam    on exam, there has been significant improvement in the right axillary wound. It is now only approximately 2 cm x 1 cm in size. I treated it with silver nitrate Assessment:     Nonhealing surgical wound with chronic hidradenitis     Plan:     I will start her back on the clindamycin solution to her areas of hidradenitis. She will continue her local wound care to the axilla. I will see her back in approximately one month

## 2014-01-28 ENCOUNTER — Encounter (INDEPENDENT_AMBULATORY_CARE_PROVIDER_SITE_OTHER): Payer: Self-pay | Admitting: Surgery

## 2014-01-28 ENCOUNTER — Ambulatory Visit (INDEPENDENT_AMBULATORY_CARE_PROVIDER_SITE_OTHER): Payer: 59 | Admitting: Surgery

## 2014-01-28 VITALS — BP 124/80 | HR 72 | Temp 97.5°F | Ht 68.0 in | Wt 174.0 lb

## 2014-01-28 DIAGNOSIS — T8189XA Other complications of procedures, not elsewhere classified, initial encounter: Secondary | ICD-10-CM

## 2014-01-28 DIAGNOSIS — T8189XD Other complications of procedures, not elsewhere classified, subsequent encounter: Secondary | ICD-10-CM

## 2014-01-28 DIAGNOSIS — Z5189 Encounter for other specified aftercare: Secondary | ICD-10-CM

## 2014-01-28 MED ORDER — CLINDAMYCIN HCL 150 MG PO CAPS: 150.0000 mg | ORAL_CAPSULE | Freq: Three times a day (TID) | ORAL | Status: AC

## 2014-01-28 MED ORDER — HYDROCODONE-ACETAMINOPHEN 5-325 MG PO TABS: 1.0000 | ORAL_TABLET | ORAL | Status: DC | PRN

## 2014-01-28 NOTE — Progress Notes (Signed)
Subjective:     Patient ID: Elizabeth Burke, female   DOB: [birth year], 28 y.o.   MRN: [id number]  HPI She is here for a followup of the open wound in her right axilla.  She reports that the wound completely closed and opened up a Little over week ago and now has been exquisitely tender  Review of Systems     Objective:   Physical Exam On exam, there is a small open wound with granulation tissue and no purulence which is very tender    Assessment:     Nonhealing surgical wound     Plan:     She will start placing Neosporin on the wound daily and I will put her back on clindamycin orally. I will also renew her pain medication. I will see her back in one week

## 2014-02-07 ENCOUNTER — Ambulatory Visit (INDEPENDENT_AMBULATORY_CARE_PROVIDER_SITE_OTHER): Payer: 59 | Admitting: Surgery

## 2014-02-07 ENCOUNTER — Encounter (INDEPENDENT_AMBULATORY_CARE_PROVIDER_SITE_OTHER): Payer: Self-pay | Admitting: Surgery

## 2014-02-07 VITALS — BP 118/70 | HR 75 | Temp 98.0°F | Wt 172.0 lb

## 2014-02-07 DIAGNOSIS — Z5189 Encounter for other specified aftercare: Secondary | ICD-10-CM

## 2014-02-07 DIAGNOSIS — T8189XA Other complications of procedures, not elsewhere classified, initial encounter: Secondary | ICD-10-CM

## 2014-02-07 DIAGNOSIS — T8189XD Other complications of procedures, not elsewhere classified, subsequent encounter: Secondary | ICD-10-CM

## 2014-02-07 NOTE — Progress Notes (Signed)
Subjective:     Patient ID: Elizabeth Burke, female   DOB: 10-02-1985, 28 y.o.   MRN: [id number]  HPI She reports the discomfort in her right axilla from a chronic wound has improved. She is still on clindamycin and using Neosporin.  Review of Systems     Objective:   Physical Exam The axillary wound is clean with excellent granulation tissue. It is approximately 2 cm x 2 cm in size. There is no further swelling or erythema    Assessment:     Chronic hidradenitis with nonhealing surgical wound     Plan:     She will continue the current wound care. I will see her back in approximately 3 weeks

## 2014-03-04 ENCOUNTER — Encounter (INDEPENDENT_AMBULATORY_CARE_PROVIDER_SITE_OTHER): Payer: No Typology Code available for payment source | Admitting: Surgery

## 2015-04-21 ENCOUNTER — Ambulatory Visit (INDEPENDENT_AMBULATORY_CARE_PROVIDER_SITE_OTHER): Payer: Self-pay | Admitting: Internal Medicine

## 2015-04-21 ENCOUNTER — Encounter: Payer: Self-pay | Admitting: Internal Medicine

## 2015-04-21 VITALS — BP 110/78 | HR 80 | Ht 67.5 in | Wt 165.0 lb

## 2015-04-21 DIAGNOSIS — N898 Other specified noninflammatory disorders of vagina: Secondary | ICD-10-CM

## 2015-04-21 LAB — POCT WET PREP WITH KOH
KOH Prep POC: NEGATIVE
RBC Wet Prep HPF POC: NEGATIVE
Trichomonas, UA: NEGATIVE
Yeast Wet Prep HPF POC: NEGATIVE

## 2015-04-21 NOTE — Patient Instructions (Signed)
[facility] to see how you have been treated before as well as the results of the rest of your labs before deciding how to treat. Also await your records regarding your joints/connective tissue

## 2015-04-21 NOTE — Progress Notes (Signed)
   Subjective:    Patient ID: Elizabeth Burke, female    DOB: December 07, 1985, 29 y.o.   MRN: [id number]  HPI  1.  Recurrent BV:  Fishy odor with itching and drainage.  Has had to utilize topical and oral clindamycin  recurrently.  Last evaluated for this 1 year ago.  Cannot afford the treatment since then.   Notes the odor and discharge about 2 weeks prior to period starting.  Odor sometimes after intercourse with boyfriend, but had this before they started seeing each other.    Review of Systems     Objective:   Physical Exam  Pt. Has significant difficulty adequately relaxing for optimal exam. GU:  Scarring from hidradenitis suppurative and surgery bilateral inner thighs. Scant thick yellow discharge without underlying erythema or inflammation of vaginal and cervical mucosa.  No odor No CMT. Uterus smooth and without mass or tenderness. No adnexal mass or definitive tenderness--pt. Stated tenderness with examination of left adnexa, but was obviously anxious and limiting exam.       Assessment & Plan:  1.  Recurrent Vaginal Discharge:  Not clear that this is BV--whiff negative, though some Clue Cells.  Sending off GC/Chlamydia. [facility] [address] [facility] as to what has not worked in the past.   2.  ?Sponyloarthropathy:  Send for records.

## 2015-04-22 LAB — GC/CHLAMYDIA PROBE AMP
Chlamydia trachomatis, NAA: NEGATIVE
NEISSERIA GONORRHOEAE BY PCR: NEGATIVE

## 2015-06-22 ENCOUNTER — Emergency Department (HOSPITAL_COMMUNITY)
Admission: EM | Admit: 2015-06-22 | Discharge: 2015-06-23 | Disposition: A | Discharge status: Home / Self Care | Payer: Self-pay | Attending: Emergency Medicine | Admitting: Emergency Medicine

## 2015-06-22 ENCOUNTER — Emergency Department (HOSPITAL_COMMUNITY): Payer: Self-pay

## 2015-06-22 ENCOUNTER — Encounter (HOSPITAL_COMMUNITY): Payer: Self-pay | Admitting: Emergency Medicine

## 2015-06-22 DIAGNOSIS — F17210 Nicotine dependence, cigarettes, uncomplicated: Secondary | ICD-10-CM | POA: Insufficient documentation

## 2015-06-22 DIAGNOSIS — M1990 Unspecified osteoarthritis, unspecified site: Secondary | ICD-10-CM | POA: Insufficient documentation

## 2015-06-22 DIAGNOSIS — R079 Chest pain, unspecified: Secondary | ICD-10-CM

## 2015-06-22 DIAGNOSIS — Z8719 Personal history of other diseases of the digestive system: Secondary | ICD-10-CM | POA: Insufficient documentation

## 2015-06-22 DIAGNOSIS — Z8669 Personal history of other diseases of the nervous system and sense organs: Secondary | ICD-10-CM | POA: Insufficient documentation

## 2015-06-22 DIAGNOSIS — Z87440 Personal history of urinary (tract) infections: Secondary | ICD-10-CM | POA: Insufficient documentation

## 2015-06-22 DIAGNOSIS — M419 Scoliosis, unspecified: Secondary | ICD-10-CM | POA: Insufficient documentation

## 2015-06-22 LAB — CBC
HEMATOCRIT: 36.2 % (ref 36.0–46.0)
HEMOGLOBIN: 11.1 g/dL — AB (ref 12.0–15.0)
MCH: 24 pg — ABNORMAL LOW (ref 26.0–34.0)
MCHC: 30.7 g/dL (ref 30.0–36.0)
MCV: 78.4 fL (ref 78.0–100.0)
Platelets: 344 10*3/uL (ref 150–400)
RBC: 4.62 MIL/uL (ref 3.87–5.11)
RDW: 14.7 % (ref 11.5–15.5)
WBC: 7.4 10*3/uL (ref 4.0–10.5)

## 2015-06-22 LAB — BASIC METABOLIC PANEL
ANION GAP: 7 (ref 5–15)
BUN: 11 mg/dL (ref 6–20)
CHLORIDE: 102 mmol/L (ref 101–111)
CO2: 26 mmol/L (ref 22–32)
Calcium: 9 mg/dL (ref 8.9–10.3)
Creatinine, Ser: 0.77 mg/dL (ref 0.44–1.00)
GFR calc Af Amer: >60 mL/min (ref >60)
GFR calc non Af Amer: >60 mL/min (ref >60)
Glucose, Bld: 92 mg/dL (ref 65–99)
POTASSIUM: 4.2 mmol/L (ref 3.5–5.1)
SODIUM: 135 mmol/L (ref 135–145)

## 2015-06-22 LAB — I-STAT TROPONIN, ED: Troponin i, poc: 0 ng/mL (ref 0.00–0.08)

## 2015-06-22 MED ORDER — GI COCKTAIL ~~LOC~~
30.0000 mL | Freq: Once | ORAL | Status: AC
  Administered 2015-06-22: 30 mL via ORAL
  Filled 2015-06-22: qty 30

## 2015-06-22 NOTE — ED Notes (Signed)
Pt. reports central chest pain with mild SOB onset 2 days ago , denies emesis or diaphoresis .

## 2015-06-22 NOTE — ED Notes (Signed)
Patient transported to X-ray 

## 2015-06-23 MED ORDER — TRAMADOL HCL 50 MG PO TABS: 50.0000 mg | ORAL_TABLET | Freq: Four times a day (QID) | ORAL | Status: DC | PRN

## 2015-06-23 MED ORDER — TRAMADOL HCL 50 MG PO TABS
50.0000 mg | ORAL_TABLET | Freq: Once | ORAL | Status: AC
  Administered 2015-06-23: 50 mg via ORAL
  Filled 2015-06-23: qty 1

## 2015-06-23 NOTE — Discharge Instructions (Signed)
YOUR TESTS ARE NEGATIVE, AND CHEST X-RAY IS CLEAR. YOU CAN BE DISCHARGED HOME WITH MEDICATION FOR CHEST WALL PAIN AND SHOULD FOLLOW UP WITH YOUR DOCTOR OR WITH [facility] FOR FURTHER EVALUATION IF SYMPTOMS PERSIST.   Chest Wall Pain Chest wall pain is pain in or around the bones and muscles of your chest. Sometimes, an injury causes this pain. Sometimes, the cause may not be known. This pain may take several weeks or longer to get better. HOME CARE Pay attention to any changes in your symptoms. Take these actions to help with your pain:  Rest as told by your doctor.  Avoid activities that cause pain. Try not to use your chest, belly (abdominal), or side muscles to lift heavy things.  If directed, apply ice to the painful area:  Put ice in a plastic bag.  Place a towel between your skin and the bag.  Leave the ice on for 20 minutes, 2-3 times per day.  Take over-the-counter and prescription medicines only as told by your doctor.  Do not use tobacco products, including cigarettes, chewing tobacco, and e-cigarettes. If you need help quitting, ask your doctor.  Keep all follow-up visits as told by your doctor. This is important. GET HELP IF:  You have a fever.  Your chest pain gets worse.  You have new symptoms. GET HELP RIGHT AWAY IF:  You feel sick to your stomach (nauseous) or you throw up (vomit).  You feel sweaty or light-headed.  You have a cough with phlegm (sputum) or you cough up blood.  You are short of breath.   This information is not intended to replace advice given to you by your health care provider. Make sure you discuss any questions you have with your health care provider.   Document Released: 12/08/2007 Document Revised: 03/12/2015 Document Reviewed: 09/16/2014 Elsevier Interactive Patient Education [employer]2016 Elsevier Inc.

## 2015-06-23 NOTE — ED Provider Notes (Signed)
CSN: [id number]     Arrival date & time 06/22/15  2240 History   First MD Initiated Contact with Patient 06/22/15 2249     Chief Complaint  Patient presents with  . Chest Pain     (Consider location/radiation/quality/duration/timing/severity/associated sxs/prior Treatment) Patient is a 29 y.o. female presenting with chest pain. The history is provided by the patient. No language interpreter was used.  Chest Pain Pain location:  L chest Pain quality: aching and sharp   Pain radiates to:  Does not radiate Pain radiates to the back: no   Pain severity:  Severe Associated symptoms: no back pain and no fever   Associated symptoms comment:  Patient reports left chest pain that was mild for the past 3 days, which became severe today. No cough, SOB or fever. No nausea or vomiting. She denies history of similar symptoms in the past. She reports being a smoker until 3 days ago when symptoms began. She has tried ibuprofen and hot compresses without relief.    Past Medical History  Diagnosis Date  . Urinary tract infection   . H/O hiatal hernia   . Headache(784.0)   . Allergy     Seasonal  . Arthritis     Spondyloarthropathy--followed by Dr Dierdre ForthBeekman  . Neuromuscular disorder (HCC)     Pins and needles in right arm following surgery for hidradenitis.  Marland Kitchen. GERD (gastroesophageal reflux disease) 2000 or so  . Scoliosis Dx2006   Past Surgical History  Procedure Laterality Date  . Nose surgery N/A 2012    removal of fish hook   . Hydradenitis excision Bilateral 07/31/2013    Procedure: EXCISION HYDRADENITIS RIGHT AXILLA  AND LEFT GROIN ;  Surgeon: Shelly Rubensteinouglas A Blackman, MD;  Location: [facility] OR;  Service: General;  Laterality: Bilateral;  . Fracture surgery Left 2000    Left wrist--casted only, no surgery   Family History  Problem Relation Age of Onset  . Cancer Maternal Grandmother     bladder and liver  . Diabetes Maternal Grandfather   . Cancer Maternal Grandfather     prostate  . Diabetes  Paternal Grandmother   . Hypertension Mother   . Sleep apnea Father   . Ovarian cysts Sister     polycystic ovarian disease  . Arthritis Paternal Grandfather     unknown joint problem   Social History  Substance Use Topics  . Smoking status: Current Every Day Smoker -- 0.25 packs/day for 4 years    Types: Cigarettes    Start date: 07/05/2004  . Smokeless tobacco: Never Used  . Alcohol Use: 0.0 oz/week    0 Standard drinks or equivalent per week   OB History    Gravida Para Term Preterm AB TAB SAB Ectopic Multiple Living   1 1 1       1      Review of Systems  Constitutional: Negative for fever and chills.  HENT: Negative.   Respiratory: Negative.   Cardiovascular: Positive for chest pain.  Gastrointestinal: Negative.   Musculoskeletal: Negative.  Negative for back pain.  Skin: Negative.   Neurological: Negative.       Allergies  Review of patient's allergies indicates no known allergies.  Home Medications   Prior to Admission medications   Medication Sig Start Date End Date Taking? Authorizing Provider  ibuprofen (ADVIL,MOTRIN) 200 MG tablet Take 1,000 mg by mouth 3 (three) times daily.   Yes Historical Provider, MD  meloxicam (MOBIC) 15 MG tablet Take 15 mg by mouth daily  as needed for pain.   Yes Historical Provider, MD  traMADol (ULTRAM) 50 MG tablet Take 1 tablet (50 mg total) by mouth every 6 (six) hours as needed. 06/23/15   Leviathan Macera, PA-C   BP 142/100 mmHg  Pulse 71  Temp(Src) 98 F (36.7 C) (Oral)  Resp 20  Ht 5' 7.75" (1.721 m)  Wt 77.565 kg  BMI 26.19 kg/m2  SpO2 99%  LMP 05/23/2015 (Approximate) Physical Exam  Constitutional: She is oriented to person, place, and time. She appears well-developed and well-nourished.  Tearful.  HENT:  Head: Normocephalic.  Neck: Normal range of motion. Neck supple.  Cardiovascular: Normal rate and regular rhythm.   Pulmonary/Chest: Effort normal and breath sounds normal.  Tenderness to palpation left  sternal chest. No swelling.  Abdominal: Soft. Bowel sounds are normal. There is no tenderness. There is no rebound and no guarding.  Musculoskeletal: Normal range of motion.  Neurological: She is alert and oriented to person, place, and time.  Skin: Skin is warm and dry. No rash noted.  Psychiatric: She has a normal mood and affect.    ED Course  Procedures (including critical care time) Labs Review Labs Reviewed  CBC - Abnormal; Notable for the following:    Hemoglobin 11.1 (*)    MCH 24.0 (*)    All other components within normal limits  BASIC METABOLIC PANEL  I-STAT TROPOININ, ED    Imaging Review Dg Chest 2 View  06/22/2015  CLINICAL DATA:  Left-sided chest pain for 3 days EXAM: CHEST  2 VIEW COMPARISON:  None. FINDINGS: Normal heart size and mediastinal contours. No acute infiltrate or edema. No effusion or pneumothorax. Thoracic dextroscoliosis, known per the EMR. There is mild subchondral irregularity of the lateral right clavicle, likely chronic/ posttraumatic. IMPRESSION: No active cardiopulmonary disease. Electronically Signed   By: Marnee Spring M.D.   On: 06/22/2015 23:55   I have personally reviewed and evaluated these images and lab results as part of my medical decision-making.   EKG Interpretation   Date/Time:  Sunday June 22 2015 22:41:42 EST Ventricular Rate:  84 PR Interval:  150 QRS Duration: 80 QT Interval:  364 QTC Calculation: 430 R Axis:   81 Text Interpretation:  Normal sinus rhythm with sinus arrhythmia Normal ECG  Sinus rhythm Sinus arrhythmia Artifact Abnormal ekg Confirmed by Gerhard Munch  MD [phone]) on 06/22/2015 11:12:23 PM      MDM   Final diagnoses:  Chest pain, unspecified chest pain type    GI Cocktail without relief. Suspect costochondritis given reproducible tenderness and normal lab/imaging examination. VSS. No hypoxia. PERC negative.     Elpidio Anis, PA-C 06/23/15 [id number]  Gerhard Munch, MD 06/25/15 [phone]

## 2015-06-23 NOTE — ED Notes (Signed)
Pt st's no relief from GI cocktail

## 2016-04-22 ENCOUNTER — Inpatient Hospital Stay (HOSPITAL_COMMUNITY): Payer: Self-pay

## 2016-04-22 ENCOUNTER — Encounter: Payer: Self-pay | Admitting: Student

## 2016-04-22 ENCOUNTER — Inpatient Hospital Stay (HOSPITAL_COMMUNITY)
Admission: AD | Admit: 2016-04-22 | Discharge: 2016-04-22 | Disposition: A | Discharge status: Home / Self Care | Payer: 59 | Source: Ambulatory Visit | Attending: Obstetrics & Gynecology | Admitting: Obstetrics & Gynecology

## 2016-04-22 DIAGNOSIS — Z3A01 Less than 8 weeks gestation of pregnancy: Secondary | ICD-10-CM

## 2016-04-22 DIAGNOSIS — R109 Unspecified abdominal pain: Secondary | ICD-10-CM

## 2016-04-22 DIAGNOSIS — O26891 Other specified pregnancy related conditions, first trimester: Secondary | ICD-10-CM | POA: Diagnosis not present

## 2016-04-22 DIAGNOSIS — F17210 Nicotine dependence, cigarettes, uncomplicated: Secondary | ICD-10-CM | POA: Insufficient documentation

## 2016-04-22 DIAGNOSIS — O26899 Other specified pregnancy related conditions, unspecified trimester: Secondary | ICD-10-CM

## 2016-04-22 DIAGNOSIS — O3680X0 Pregnancy with inconclusive fetal viability, not applicable or unspecified: Secondary | ICD-10-CM

## 2016-04-22 DIAGNOSIS — O99331 Smoking (tobacco) complicating pregnancy, first trimester: Secondary | ICD-10-CM | POA: Insufficient documentation

## 2016-04-22 LAB — URINALYSIS, ROUTINE W REFLEX MICROSCOPIC
BILIRUBIN URINE: NEGATIVE
GLUCOSE, UA: NEGATIVE mg/dL
HGB URINE DIPSTICK: NEGATIVE
Ketones, ur: NEGATIVE mg/dL
Leukocytes, UA: NEGATIVE
Nitrite: NEGATIVE
PH: 6 (ref 5.0–8.0)
Protein, ur: NEGATIVE mg/dL
SPECIFIC GRAVITY, URINE: 1.015 (ref 1.005–1.030)

## 2016-04-22 LAB — CBC
HEMATOCRIT: 37.1 % (ref 36.0–46.0)
HEMOGLOBIN: 12 g/dL (ref 12.0–15.0)
MCH: 24.4 pg — ABNORMAL LOW (ref 26.0–34.0)
MCHC: 32.3 g/dL (ref 30.0–36.0)
MCV: 75.6 fL — AB (ref 78.0–100.0)
Platelets: 403 10*3/uL — ABNORMAL HIGH (ref 150–400)
RBC: 4.91 MIL/uL (ref 3.87–5.11)
RDW: 15 % (ref 11.5–15.5)
WBC: 9 10*3/uL (ref 4.0–10.5)

## 2016-04-22 LAB — WET PREP, GENITAL
CLUE CELLS WET PREP: NONE SEEN
SPERM: NONE SEEN
Trich, Wet Prep: NONE SEEN
Yeast Wet Prep HPF POC: NONE SEEN

## 2016-04-22 LAB — POCT PREGNANCY, URINE: Preg Test, Ur: POSITIVE — AB

## 2016-04-22 LAB — HCG, QUANTITATIVE, PREGNANCY: HCG, BETA CHAIN, QUANT, S: 694 m[IU]/mL — AB (ref <5)

## 2016-04-22 NOTE — MAU Note (Signed)
Pt just found out she was pregnant. 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 Also she had dental xrays taken a few weeks ago and is worried about her pregnancy. C/o Pelvic pain and back pain off and on x 3 weeks. Also c/o a sharp epigastric pain that comes and goes,

## 2016-04-22 NOTE — Discharge Instructions (Signed)

## 2016-04-22 NOTE — MAU Provider Note (Signed)
History     CSN: [id number]  Arrival date and time: 04/22/16 1714   First Provider Initiated Contact with Patient 04/22/16 2033      Chief Complaint  Patient presents with  . Abdominal Pain   HPI  CORNELIUS SCHUITEMA is a 30 y.o. G2P1001 at [redacted]w[redacted]d by LMP who presents with abdominal pain. Reports lower abdominal cramping x 3 weeks that comes & goes. Pain worse today. Rates pain 7/10. Taking ibuprofen for spondyloarthropathy without relief of abdominal pain. Nothing makes pain better or worse.  Some nausea, no vomiting. Also reports constipation; last BM this morning; had to strain for hard stool.  Denies vaginal bleeding or discharge.   OB History    Gravida Para Term Preterm AB Living   2 1 1     1    SAB TAB Ectopic Multiple Live Births                  Past Medical History:  Diagnosis Date  . Allergy    Seasonal  . Arthritis    Spondyloarthropathy--followed by Dr Dierdre Forth  . GERD (gastroesophageal reflux disease) 2000 or so  . H/O hiatal hernia   . Headache(784.0)   . Neuromuscular disorder (HCC)    Pins and needles in right arm following surgery for hidradenitis.  . Scoliosis Dx2006  . Urinary tract infection     Past Surgical History:  Procedure Laterality Date  . FRACTURE SURGERY Left 2000   Left wrist--casted only, no surgery  . HYDRADENITIS EXCISION Bilateral 07/31/2013   Procedure: EXCISION HYDRADENITIS RIGHT AXILLA  AND LEFT GROIN ;  Surgeon: Shelly Rubenstein, MD;  Location: [facility] OR;  Service: General;  Laterality: Bilateral;  . NOSE SURGERY N/A 2012   removal of fish hook     Family History  Problem Relation Age of Onset  . Cancer Maternal Grandmother     bladder and liver  . Diabetes Maternal Grandfather   . Cancer Maternal Grandfather     prostate  . Diabetes Paternal Grandmother   . Hypertension Mother   . Sleep apnea Father   . Ovarian cysts Sister     polycystic ovarian disease  . Arthritis Paternal Grandfather     unknown joint problem     Social History  Substance Use Topics  . Smoking status: Current Every Day Smoker    Packs/day: 0.25    Years: 4.00    Types: Cigarettes    Start date: 07/05/2004  . Smokeless tobacco: Never Used  . Alcohol use 0.0 oz/week    Allergies: No Known Allergies  Prescriptions Prior to Admission  Medication Sig Dispense Refill Last Dose  . ibuprofen (ADVIL,MOTRIN) 200 MG tablet Take 400 mg by mouth every 6 (six) hours as needed for headache, mild pain or moderate pain.    Past Week at Unknown time    Review of Systems  Constitutional: Negative.   Gastrointestinal: Positive for abdominal pain, constipation and nausea. Negative for blood in stool, diarrhea and vomiting.  Genitourinary: Negative.    Physical Exam   Blood pressure 122/89, pulse 106, temperature 98.8 F (37.1 C), temperature source Oral, resp. rate 18, height 5\' 8"  (1.727 m), weight 155 lb 1.9 oz (70.4 kg), last menstrual period 05/23/2015.  Physical Exam  Nursing note and vitals reviewed. Constitutional: She is oriented to person, place, and time. She appears well-developed and well-nourished. No distress.  HENT:  Head: Normocephalic and atraumatic.  Eyes: Conjunctivae are normal. Right eye exhibits no discharge. Left eye  exhibits no discharge. No scleral icterus.  Neck: Normal range of motion.  Respiratory: Effort normal. No respiratory distress.  GI: Soft. She exhibits no distension. There is no tenderness. There is no rebound.  Genitourinary: Cervix exhibits no motion tenderness. No bleeding in the vagina.  Genitourinary Comments: Cervix closed  Neurological: She is alert and oriented to person, place, and time.  Skin: Skin is warm and dry. She is not diaphoretic.  Psychiatric: She has a normal mood and affect. Her behavior is normal. Judgment and thought content normal.    MAU Course  Procedures Results for orders placed or performed during the hospital encounter of 04/22/16 (from the past 24 hour(s))   Urinalysis, Routine w reflex microscopic (not at [facility])     Status: None   Collection Time: 04/22/16  6:06 PM  Result Value Ref Range   Color, Urine YELLOW YELLOW   APPearance CLEAR CLEAR   Specific Gravity, Urine 1.015 1.005 - 1.030   pH 6.0 5.0 - 8.0   Glucose, UA NEGATIVE NEGATIVE mg/dL   Hgb urine dipstick NEGATIVE NEGATIVE   Bilirubin Urine NEGATIVE NEGATIVE   Ketones, ur NEGATIVE NEGATIVE mg/dL   Protein, ur NEGATIVE NEGATIVE mg/dL   Nitrite NEGATIVE NEGATIVE   Leukocytes, UA NEGATIVE NEGATIVE  Pregnancy, urine POC     Status: Abnormal   Collection Time: 04/22/16  6:12 PM  Result Value Ref Range   Preg Test, Ur POSITIVE (A) NEGATIVE  CBC     Status: Abnormal   Collection Time: 04/22/16  7:08 PM  Result Value Ref Range   WBC 9.0 4.0 - 10.5 K/uL   RBC 4.91 3.87 - 5.11 MIL/uL   Hemoglobin 12.0 12.0 - 15.0 g/dL   HCT 96.0 45.4 - 09.8 %   MCV 75.6 (L) 78.0 - 100.0 fL   MCH 24.4 (L) 26.0 - 34.0 pg   MCHC 32.3 30.0 - 36.0 g/dL   RDW 11.9 14.7 - 82.9 %   Platelets 403 (H) 150 - 400 K/uL  ABO/Rh     Status: None (Preliminary result)   Collection Time: 04/22/16  7:08 PM  Result Value Ref Range   ABO/RH(D) B POS   hCG, quantitative, pregnancy     Status: Abnormal   Collection Time: 04/22/16  7:08 PM  Result Value Ref Range   hCG, Beta Chain, Quant, S 694 (H) <5 mIU/mL  Wet prep, genital     Status: Abnormal   Collection Time: 04/22/16  8:43 PM  Result Value Ref Range   Yeast Wet Prep HPF POC NONE SEEN NONE SEEN   Trich, Wet Prep NONE SEEN NONE SEEN   Clue Cells Wet Prep HPF POC NONE SEEN NONE SEEN   WBC, Wet Prep HPF POC FEW (A) NONE SEEN   Sperm NONE SEEN    US Ob Comp Less 14 Wks  Result Date: 04/22/2016 CLINICAL DATA:  Acute onset of abdominal cramping. Initial encounter. EXAM: OBSTETRIC <14 WK [place] AND TRANSVAGINAL OB US TECHNIQUE: Both transabdominal and transvaginal ultrasound examinations were performed for complete evaluation of the gestation as well as the  maternal uterus, adnexal regions, and pelvic cul-de-sac. Transvaginal technique was performed to assess early pregnancy. COMPARISON:  CT of the abdomen and pelvis performed 09/08/2009 FINDINGS: Intrauterine gestational sac: None seen. Yolk sac:  N/A Embryo:  N/A Subchorionic hemorrhage:  None visualized. Maternal uterus/adnexae: The uterus is otherwise unremarkable in appearance. The ovaries are within normal limits. The right ovary measures 2.4 x 1.4 x 2.0 cm, while the  left ovary measures 2.8 x 1.8 x 2.7 cm. No suspicious adnexal masses are seen; there is no evidence for ovarian torsion. No free fluid is seen within the pelvic cul-de-sac. IMPRESSION: No intrauterine gestational sac seen. No evidence for ectopic pregnancy. This remains within normal limits given the quantitative beta HCG level of 694. If the quantitative beta HCG level continues to rise, follow-up pelvic ultrasound could be performed in 2 weeks for further evaluation. Electronically Signed   By: Roanna RaiderJeffery  Chang M.D.   On: 04/22/2016 20:22   [place] Ob Transvaginal  Result Date: 04/22/2016 CLINICAL DATA:  Acute onset of abdominal cramping. Initial encounter. EXAM: OBSTETRIC <14 WK US AND TRANSVAGINAL OB US TECHNIQUE: Both transabdominal and transvaginal ultrasound examinations were performed for complete evaluation of the gestation as well as the maternal uterus, adnexal regions, and pelvic cul-de-sac. Transvaginal technique was performed to assess early pregnancy. COMPARISON:  CT of the abdomen and pelvis performed 09/08/2009 FINDINGS: Intrauterine gestational sac: None seen. Yolk sac:  N/A Embryo:  N/A Subchorionic hemorrhage:  None visualized. Maternal uterus/adnexae: The uterus is otherwise unremarkable in appearance. The ovaries are within normal limits. The right ovary measures 2.4 x 1.4 x 2.0 cm, while the left ovary measures 2.8 x 1.8 x 2.7 cm. No suspicious adnexal masses are seen; there is no evidence for ovarian torsion. No free fluid is  seen within the pelvic cul-de-sac. IMPRESSION: No intrauterine gestational sac seen. No evidence for ectopic pregnancy. This remains within normal limits given the quantitative beta HCG level of 694. If the quantitative beta HCG level continues to rise, follow-up pelvic ultrasound could be performed in 2 weeks for further evaluation. Electronically Signed   By: Roanna RaiderJeffery  Chang M.D.   On: 04/22/2016 20:22    MDM +UPT UA, wet prep, GC/chlamydia, CBC, ABO/Rh, quant hCG, HIV, and US today to rule out ectopic pregnancy Ultrasound shows no iup or adnexal mass Bhcg 694 Assessment and Plan  A: 1. Pregnancy of unknown anatomic location   2. Abdominal cramping affecting pregnancy    P: Discharge home Return to MAU Saturday evening or Sunday morning for f/u BHCG Discussed reasons to return to MAU before then  Judeth HornErin Sorren Vallier 04/22/2016, 8:33 PM

## 2016-04-23 LAB — GC/CHLAMYDIA PROBE AMP (~~LOC~~) NOT AT ARMC
CHLAMYDIA, DNA PROBE: NEGATIVE
NEISSERIA GONORRHEA: NEGATIVE

## 2016-04-23 LAB — ABO/RH: ABO/RH(D): B POS

## 2016-04-24 ENCOUNTER — Inpatient Hospital Stay (HOSPITAL_COMMUNITY)
Admission: AD | Admit: 2016-04-24 | Discharge: 2016-04-25 | Disposition: A | Discharge status: Home / Self Care | Payer: 59 | Source: Ambulatory Visit | Attending: Obstetrics & Gynecology | Admitting: Obstetrics & Gynecology

## 2016-04-24 DIAGNOSIS — O3680X0 Pregnancy with inconclusive fetal viability, not applicable or unspecified: Secondary | ICD-10-CM

## 2016-04-24 DIAGNOSIS — Z3A00 Weeks of gestation of pregnancy not specified: Secondary | ICD-10-CM | POA: Insufficient documentation

## 2016-04-24 DIAGNOSIS — O26891 Other specified pregnancy related conditions, first trimester: Secondary | ICD-10-CM | POA: Insufficient documentation

## 2016-04-24 DIAGNOSIS — R109 Unspecified abdominal pain: Secondary | ICD-10-CM | POA: Insufficient documentation

## 2016-04-24 DIAGNOSIS — R102 Pelvic and perineal pain: Secondary | ICD-10-CM | POA: Insufficient documentation

## 2016-04-25 DIAGNOSIS — R109 Unspecified abdominal pain: Secondary | ICD-10-CM

## 2016-04-25 DIAGNOSIS — O26891 Other specified pregnancy related conditions, first trimester: Secondary | ICD-10-CM | POA: Diagnosis not present

## 2016-04-25 DIAGNOSIS — R102 Pelvic and perineal pain: Secondary | ICD-10-CM | POA: Diagnosis present

## 2016-04-25 DIAGNOSIS — Z3A00 Weeks of gestation of pregnancy not specified: Secondary | ICD-10-CM | POA: Diagnosis not present

## 2016-04-25 LAB — HCG, QUANTITATIVE, PREGNANCY: HCG, BETA CHAIN, QUANT, S: 1165 m[IU]/mL — AB (ref <5)

## 2016-04-25 NOTE — Progress Notes (Signed)
Venia CarbonJennifer Rasch NP in Triage to discuss test results and d/c plan with pt. Written and verbal d/c instructions given and understanding voiced

## 2016-04-25 NOTE — Discharge Instructions (Signed)

## 2016-04-25 NOTE — MAU Note (Addendum)
Here for repeat blood work. Some mild pelvic pain at times that I have had before pregnancy. Mild cramping on occ. No changes since 3 days ago. No bleeding

## 2016-04-25 NOTE — Progress Notes (Signed)
Elizabeth CarbonJennifer Rasch NP notified of pt's repeat BHCG results. Will see pt in Triage

## 2016-04-30 ENCOUNTER — Inpatient Hospital Stay (HOSPITAL_COMMUNITY): Payer: 59

## 2016-04-30 ENCOUNTER — Inpatient Hospital Stay (HOSPITAL_COMMUNITY)
Admission: AD | Admit: 2016-04-30 | Discharge: 2016-04-30 | Disposition: A | Discharge status: Home / Self Care | Payer: 59 | Source: Ambulatory Visit | Attending: Obstetrics and Gynecology | Admitting: Obstetrics and Gynecology

## 2016-04-30 ENCOUNTER — Encounter (HOSPITAL_COMMUNITY): Payer: Self-pay | Admitting: Certified Nurse Midwife

## 2016-04-30 DIAGNOSIS — M1990 Unspecified osteoarthritis, unspecified site: Secondary | ICD-10-CM | POA: Insufficient documentation

## 2016-04-30 DIAGNOSIS — K219 Gastro-esophageal reflux disease without esophagitis: Secondary | ICD-10-CM | POA: Diagnosis not present

## 2016-04-30 DIAGNOSIS — Z3A01 Less than 8 weeks gestation of pregnancy: Secondary | ICD-10-CM | POA: Diagnosis not present

## 2016-04-30 DIAGNOSIS — Z87891 Personal history of nicotine dependence: Secondary | ICD-10-CM | POA: Insufficient documentation

## 2016-04-30 DIAGNOSIS — O0281 Inappropriate change in quantitative human chorionic gonadotropin (hCG) in early pregnancy: Secondary | ICD-10-CM | POA: Insufficient documentation

## 2016-04-30 DIAGNOSIS — O3680X0 Pregnancy with inconclusive fetal viability, not applicable or unspecified: Secondary | ICD-10-CM

## 2016-04-30 DIAGNOSIS — O9989 Other specified diseases and conditions complicating pregnancy, childbirth and the puerperium: Secondary | ICD-10-CM | POA: Insufficient documentation

## 2016-04-30 DIAGNOSIS — O99612 Diseases of the digestive system complicating pregnancy, second trimester: Secondary | ICD-10-CM | POA: Insufficient documentation

## 2016-04-30 DIAGNOSIS — O209 Hemorrhage in early pregnancy, unspecified: Secondary | ICD-10-CM

## 2016-04-30 DIAGNOSIS — M419 Scoliosis, unspecified: Secondary | ICD-10-CM | POA: Insufficient documentation

## 2016-04-30 LAB — CBC
HCT: 35.8 % — ABNORMAL LOW (ref 36.0–46.0)
Hemoglobin: 11.3 g/dL — ABNORMAL LOW (ref 12.0–15.0)
MCH: 24.1 pg — ABNORMAL LOW (ref 26.0–34.0)
MCHC: 31.6 g/dL (ref 30.0–36.0)
MCV: 76.3 fL — AB (ref 78.0–100.0)
PLATELETS: 367 10*3/uL (ref 150–400)
RBC: 4.69 MIL/uL (ref 3.87–5.11)
RDW: 14.9 % (ref 11.5–15.5)
WBC: 5.3 10*3/uL (ref 4.0–10.5)

## 2016-04-30 LAB — URINE MICROSCOPIC-ADD ON: WBC, UA: NONE SEEN WBC/hpf (ref 0–5)

## 2016-04-30 LAB — HCG, QUANTITATIVE, PREGNANCY: hCG, Beta Chain, Quant, S: 3093 m[IU]/mL — ABNORMAL HIGH (ref <5)

## 2016-04-30 LAB — URINALYSIS, ROUTINE W REFLEX MICROSCOPIC
Bilirubin Urine: NEGATIVE
Glucose, UA: NEGATIVE mg/dL
KETONES UR: NEGATIVE mg/dL
LEUKOCYTES UA: NEGATIVE
NITRITE: NEGATIVE
PH: 7 (ref 5.0–8.0)
Protein, ur: NEGATIVE mg/dL
Specific Gravity, Urine: 1.02 (ref 1.005–1.030)

## 2016-04-30 NOTE — MAU Note (Addendum)
Pt states she has had been spotting for 3 days and this AM she woke up to period like bleeding with a small clot. Pt states it is now back to brown spotting. Pt states she is cramping slightly.

## 2016-04-30 NOTE — Discharge Instructions (Signed)

## 2016-04-30 NOTE — MAU Provider Note (Signed)
Chief Complaint: Vaginal Bleeding   None     SUBJECTIVE HPI: Elizabeth Burke is a 30 y.o. G2P1001 at [redacted]w[redacted]d by LMP who presents to maternity admissions reporting onset of light pink spotting 3 days ago, then brown spotting yesterday, but bleeding increased and became dark red with clots today.  She reports mild abdominal cramping that is unchanged since her MAU visit on 10/19.  She had quant hcg rise from 694 to 1165 in 48+ hours from 10/19 to 10/22.  On 10/19 there was no visible IUP and no visible ectopic pregnancy on [place].  She has not tried any treatments, nothing makes her bleeding better or worse.  It is constant and gradually worsening. She denies vaginal itching/burning, urinary symptoms, h/a, dizziness, n/v, or fever/chills.     HPI  Past Medical History:  Diagnosis Date  . Allergy    Seasonal  . Arthritis    Spondyloarthropathy--followed by Dr Dierdre Forth  . GERD (gastroesophageal reflux disease) 2000 or so  . H/O hiatal hernia   . Headache(784.0)   . Neuromuscular disorder (HCC)    Pins and needles in right arm following surgery for hidradenitis.  . Scoliosis Dx2006  . Urinary tract infection    Past Surgical History:  Procedure Laterality Date  . FRACTURE SURGERY Left 2000   Left wrist--casted only, no surgery  . HYDRADENITIS EXCISION Bilateral 07/31/2013   Procedure: EXCISION HYDRADENITIS RIGHT AXILLA  AND LEFT GROIN ;  Surgeon: Shelly Rubenstein, MD;  Location: [facility] OR;  Service: General;  Laterality: Bilateral;  . NOSE SURGERY N/A 2012   removal of fish hook    Social History   Social History  . Marital status: Single    Spouse name: N/A  . Number of children: 1  . Years of education: 57   Occupational History  . 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    Social History Main Topics  . Smoking status: Former Smoker    Packs/day: 0.00    Years: 0.00    Start date: 07/05/2004  . Smokeless tobacco: Never Used  . Alcohol use No  . Drug use:      Types: Marijuana  . Sexual activity: Yes    Birth control/ protection: None   Other Topics Concern  . Not on file   Social History Narrative   Originally from [place]   Lives at home with son.   No current facility-administered medications on file prior to encounter.    No current outpatient prescriptions on file prior to encounter.   No Known Allergies  ROS:  Review of Systems  Constitutional: Negative for chills, fatigue and fever.  Respiratory: Negative for shortness of breath.   Cardiovascular: Negative for chest pain.  Genitourinary: Positive for pelvic pain and vaginal bleeding. Negative for difficulty urinating, dysuria, flank pain, vaginal discharge and vaginal pain.  Neurological: Negative for dizziness and headaches.  Psychiatric/Behavioral: Negative.      I have reviewed patient's Past Medical Hx, Surgical Hx, Family Hx, Social Hx, medications and allergies.   Physical Exam   Patient Vitals for the past 24 hrs:  BP Temp Temp src Pulse Resp  04/30/16 1641 122/85 - - 89 16  04/30/16 1016 132/73 98.4 F (36.9 C) Oral 70 18   Constitutional: Well-developed, well-nourished female in no acute distress.  Cardiovascular: normal rate Respiratory: normal effort GI: Abd soft, non-tender. Pos BS x 4 MS: Extremities nontender, no edema, normal ROM Neurologic: Alert and oriented x 4.  GU: Neg CVAT.  PELVIC EXAM: Cervix pink, visually closed, without lesion, moderate amount dark red/brown bleeding without clots noted, vaginal walls and external genitalia normal Bimanual exam: Cervix 0/long/high, firm, anterior, neg CMT, uterus nontender, nonenlarged, adnexa without tenderness, enlargement, or mass   LAB RESULTS Results for orders placed or performed during the hospital encounter of 04/30/16 (from the past 24 hour(s))  Urinalysis, Routine w reflex microscopic (not at [facility])     Status: Abnormal   Collection Time: 04/30/16 10:05 AM  Result Value Ref Range    Color, Urine YELLOW YELLOW   APPearance CLEAR CLEAR   Specific Gravity, Urine 1.020 1.005 - 1.030   pH 7.0 5.0 - 8.0   Glucose, UA NEGATIVE NEGATIVE mg/dL   Hgb urine dipstick MODERATE (A) NEGATIVE   Bilirubin Urine NEGATIVE NEGATIVE   Ketones, ur NEGATIVE NEGATIVE mg/dL   Protein, ur NEGATIVE NEGATIVE mg/dL   Nitrite NEGATIVE NEGATIVE   Leukocytes, UA NEGATIVE NEGATIVE  Urine microscopic-add on     Status: Abnormal   Collection Time: 04/30/16 10:05 AM  Result Value Ref Range   Squamous Epithelial / LPF 0-5 (A) NONE SEEN   WBC, UA NONE SEEN 0 - 5 WBC/hpf   RBC / HPF 0-5 0 - 5 RBC/hpf   Bacteria, UA RARE (A) NONE SEEN   Urine-Other MUCOUS PRESENT   hCG, quantitative, pregnancy     Status: Abnormal   Collection Time: 04/30/16 10:33 AM  Result Value Ref Range   hCG, Beta Chain, Quant, S 3,093 (H) <5 mIU/mL  CBC     Status: Abnormal   Collection Time: 04/30/16 10:33 AM  Result Value Ref Range   WBC 5.3 4.0 - 10.5 K/uL   RBC 4.69 3.87 - 5.11 MIL/uL   Hemoglobin 11.3 (L) 12.0 - 15.0 g/dL   HCT 84.135.8 (L) 32.436.0 - 40.146.0 %   MCV 76.3 (L) 78.0 - 100.0 fL   MCH 24.1 (L) 26.0 - 34.0 pg   MCHC 31.6 30.0 - 36.0 g/dL   RDW 02.714.9 25.311.5 - 66.415.5 %   Platelets 367 150 - 400 K/uL    --/--/B POS (10/19 1908)  IMAGING  04/30/16 US OB transvaginal:  Preliminary US report on 10/27 indicates GS but no YS or FP.    [place] Ob Comp Less 14 Wks  Result Date: 04/22/2016 CLINICAL DATA:  Acute onset of abdominal cramping. Initial encounter. EXAM: OBSTETRIC <14 WK US AND TRANSVAGINAL OB US TECHNIQUE: Both transabdominal and transvaginal ultrasound examinations were performed for complete evaluation of the gestation as well as the maternal uterus, adnexal regions, and pelvic cul-de-sac. Transvaginal technique was performed to assess early pregnancy. COMPARISON:  CT of the abdomen and pelvis performed 09/08/2009 FINDINGS: Intrauterine gestational sac: None seen. Yolk sac:  N/A Embryo:  N/A Subchorionic hemorrhage:   None visualized. Maternal uterus/adnexae: The uterus is otherwise unremarkable in appearance. The ovaries are within normal limits. The right ovary measures 2.4 x 1.4 x 2.0 cm, while the left ovary measures 2.8 x 1.8 x 2.7 cm. No suspicious adnexal masses are seen; there is no evidence for ovarian torsion. No free fluid is seen within the pelvic cul-de-sac. IMPRESSION: No intrauterine gestational sac seen. No evidence for ectopic pregnancy. This remains within normal limits given the quantitative beta HCG level of 694. If the quantitative beta HCG level continues to rise, follow-up pelvic ultrasound could be performed in 2 weeks for further evaluation. Electronically Signed   By: Roanna RaiderJeffery  Chang M.D.   On: 04/22/2016 20:22   [place] Ob Transvaginal  Result Date: 04/22/2016 CLINICAL DATA:  Acute onset of abdominal cramping. Initial encounter. EXAM: OBSTETRIC <14 WK [place] AND TRANSVAGINAL OB US TECHNIQUE: Both transabdominal and transvaginal ultrasound examinations were performed for complete evaluation of the gestation as well as the maternal uterus, adnexal regions, and pelvic cul-de-sac. Transvaginal technique was performed to assess early pregnancy. COMPARISON:  CT of the abdomen and pelvis performed 09/08/2009 FINDINGS: Intrauterine gestational sac: None seen. Yolk sac:  N/A Embryo:  N/A Subchorionic hemorrhage:  None visualized. Maternal uterus/adnexae: The uterus is otherwise unremarkable in appearance. The ovaries are within normal limits. The right ovary measures 2.4 x 1.4 x 2.0 cm, while the left ovary measures 2.8 x 1.8 x 2.7 cm. No suspicious adnexal masses are seen; there is no evidence for ovarian torsion. No free fluid is seen within the pelvic cul-de-sac. IMPRESSION: No intrauterine gestational sac seen. No evidence for ectopic pregnancy. This remains within normal limits given the quantitative beta HCG level of 694. If the quantitative beta HCG level continues to rise, follow-up pelvic ultrasound could  be performed in 2 weeks for further evaluation. Electronically Signed   By: Roanna Raider M.D.   On: 04/22/2016 20:22    MAU Management/MDM: Ordered labs and [place] and reviewed results. Although pt had a rise in quant hcg from 10/19 to 10/22, quant hcg today has risen but not appropriately in 5 days. [place] today does not confirm IUP. Consult Dr Jolayne Panther to review assessment, labs, [place]. Findings today could represent a normal early pregnancy, spontaneous abortion or ectopic pregnancy which can be life-threatening.  Ectopic precautions were given to the patient with plan to return in 48 hours for repeat quant hcg to evaluate pregnancy development.  Pt stable at time of discharge.  ASSESSMENT 1. Pregnancy of unknown anatomic location   2. Inappropriate change in quantitative hCG in early pregnancy   3. Vaginal bleeding in pregnancy, first trimester     PLAN Discharge home with ectopic and bleeding precautions    Medication List    STOP taking these medications   ibuprofen 200 MG tablet Commonly known as:  ADVIL,MOTRIN      Follow-up Information    [facility] [place] .   Why:  Return to MAU for repeat labwork on Sunday, 05/02/16, or sooner as needed for worsening symptoms. Contact information: [address] [place] [phone]          Sharen Counter Certified Nurse-Midwife 04/30/2016  4:44 PM

## 2016-05-02 ENCOUNTER — Inpatient Hospital Stay (HOSPITAL_COMMUNITY)
Admission: AD | Admit: 2016-05-02 | Discharge: 2016-05-02 | Disposition: A | Discharge status: Home / Self Care | Payer: 59 | Source: Ambulatory Visit | Attending: Obstetrics & Gynecology | Admitting: Obstetrics & Gynecology

## 2016-05-02 DIAGNOSIS — O039 Complete or unspecified spontaneous abortion without complication: Secondary | ICD-10-CM | POA: Diagnosis not present

## 2016-05-02 DIAGNOSIS — R102 Pelvic and perineal pain: Secondary | ICD-10-CM | POA: Insufficient documentation

## 2016-05-02 DIAGNOSIS — K219 Gastro-esophageal reflux disease without esophagitis: Secondary | ICD-10-CM | POA: Diagnosis not present

## 2016-05-02 DIAGNOSIS — M419 Scoliosis, unspecified: Secondary | ICD-10-CM | POA: Insufficient documentation

## 2016-05-02 DIAGNOSIS — M1990 Unspecified osteoarthritis, unspecified site: Secondary | ICD-10-CM | POA: Insufficient documentation

## 2016-05-02 LAB — HCG, QUANTITATIVE, PREGNANCY: HCG, BETA CHAIN, QUANT, S: 2224 m[IU]/mL — AB (ref <5)

## 2016-05-02 NOTE — Discharge Instructions (Signed)
Miscarriage  A miscarriage is the sudden loss of an unborn baby (fetus) before the 20th week of pregnancy. Most miscarriages happen in the first 3 months of pregnancy. Sometimes, it happens before a woman even knows she is pregnant. A miscarriage is also called a "spontaneous miscarriage" or "early pregnancy loss." Having a miscarriage can be an emotional experience. Talk with your caregiver about any questions you may have about miscarrying, the grieving process, and your future pregnancy plans.  CAUSES    Problems with the fetal chromosomes that make it impossible for the baby to develop normally. Problems with the baby's genes or chromosomes are most often the result of errors that occur, by chance, as the embryo divides and grows. The problems are not inherited from the parents.   Infection of the cervix or uterus.    Hormone problems.    Problems with the cervix, such as having an incompetent cervix. This is when the tissue in the cervix is not strong enough to hold the pregnancy.    Problems with the uterus, such as an abnormally shaped uterus, uterine fibroids, or congenital abnormalities.    Certain medical conditions.    Smoking, drinking alcohol, or taking illegal drugs.    Trauma.   Often, the cause of a miscarriage is unknown.   SYMPTOMS    Vaginal bleeding or spotting, with or without cramps or pain.   Pain or cramping in the abdomen or lower back.   Passing fluid, tissue, or blood clots from the vagina.  DIAGNOSIS   Your caregiver will perform a physical exam. You may also have an ultrasound to confirm the miscarriage. Blood or urine tests may also be ordered.  TREATMENT    Sometimes, treatment is not necessary if you naturally pass all the fetal tissue that was in the uterus. If some of the fetus or placenta remains in the body (incomplete miscarriage), tissue left behind may become infected and must be removed. Usually, a dilation and curettage (D and C) procedure is performed.  During a D and C procedure, the cervix is widened (dilated) and any remaining fetal or placental tissue is gently removed from the uterus.   Antibiotic medicines are prescribed if there is an infection. Other medicines may be given to reduce the size of the uterus (contract) if there is a lot of bleeding.   If you have Rh negative blood and your baby was Rh positive, you will need a Rh immunoglobulin shot. This shot will protect any future baby from having Rh blood problems in future pregnancies.  HOME CARE INSTRUCTIONS    Your caregiver may order bed rest or may allow you to continue light activity. Resume activity as directed by your caregiver.   Have someone help with home and family responsibilities during this time.    Keep track of the number of sanitary pads you use each day and how soaked (saturated) they are. Write down this information.    Do not use tampons. Do not douche or have sexual intercourse until approved by your caregiver.    Only take over-the-counter or prescription medicines for pain or discomfort as directed by your caregiver.    Do not take aspirin. Aspirin can cause bleeding.    Keep all follow-up appointments with your caregiver.    If you or your partner have problems with grieving, talk to your caregiver or seek counseling to help cope with the pregnancy loss. Allow enough time to grieve before trying to get pregnant again.     SEEK IMMEDIATE MEDICAL CARE IF:    You have severe cramps or pain in your back or abdomen.   You have a fever.   You pass large blood clots (walnut-sized or larger) ortissue from your vagina. Save any tissue for your caregiver to inspect.    Your bleeding increases.    You have a thick, bad-smelling vaginal discharge.   You become lightheaded, weak, or you faint.    You have chills.   MAKE SURE YOU:   Understand these instructions.   Will watch your condition.   Will get help right away if you are not doing well or get worse.     This  information is not intended to replace advice given to you by your health care provider. Make sure you discuss any questions you have with your health care provider.     Document Released: 12/15/2000 Document Revised: 10/16/2012 Document Reviewed: 08/10/2011  Elsevier Interactive Patient Education 2016 Elsevier Inc.

## 2016-05-02 NOTE — MAU Note (Signed)
Pt presents for follow up BHCG. States her pain started to get worse yesterday and today she started having heavy bleeding with clots.

## 2016-05-02 NOTE — MAU Provider Note (Signed)
History   [id number]   Chief Complaint  Patient presents with  . Follow-up    HPI Elizabeth Burke is a 30 y.o. female G2P1001 here for follow-up BHCG.  Upon review of the records patient was first seen on 04/22/16 for abdominal pain.   BHCG on that day was 694.  Ultrasound showed No intrauterine gestational sac seen. No evidence for ectopic pregnancy.Marland Kitchen.  GC/CT and wet prep were collected.  Results were negative.   Pt discharged home and returned on 04/25/16 for repeat BHCG and it was 1165.   Returned on 04/30/16 for spotting of blood, BHCG had an inappropriate rise to 3093 and ultrasound showed IUGS, no yolk sac.  Pt here today with repot of pelvic cramping that intensified earlier in the day.  Pain is not present at this time.  Reports increased bleeding with clots; changes pad every 3-4 hours.   All other systems negative.   Patient's last menstrual period was 03/13/2016 (exact date).  OB History  Gravida Para Term Preterm AB Living  2 1 1     1   SAB TAB Ectopic Multiple Live Births               # Outcome Date GA Lbr Len/2nd Weight Sex Delivery Anes PTL Lv  2 Current           1 Term      Vag-Spont        Complications: Chorioamnionitis      Past Medical History:  Diagnosis Date  . Allergy    Seasonal  . Arthritis    Spondyloarthropathy--followed by Dr Dierdre ForthBeekman  . GERD (gastroesophageal reflux disease) 2000 or so  . H/O hiatal hernia   . Headache(784.0)   . Neuromuscular disorder (HCC)    Pins and needles in right arm following surgery for hidradenitis.  . Scoliosis Dx2006  . Urinary tract infection     Family History  Problem Relation Age of Onset  . Cancer Maternal Grandmother     bladder and liver  . Diabetes Maternal Grandfather   . Cancer Maternal Grandfather     prostate  . Diabetes Paternal Grandmother   . Hypertension Mother   . Sleep apnea Father   . Ovarian cysts Sister     polycystic ovarian disease  . Arthritis Paternal Grandfather     unknown  joint problem    Social History   Social History  . Marital status: Single    Spouse name: N/A  . Number of children: 1  . Years of education: 8512   Occupational History  . 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    Social History Main Topics  . Smoking status: Former Smoker    Packs/day: 0.00    Years: 0.00    Start date: 07/05/2004  . Smokeless tobacco: Never Used  . Alcohol use No  . Drug use:     Types: Marijuana  . Sexual activity: Yes    Birth control/ protection: None   Other Topics Concern  . Not on file   Social History Narrative   Originally [place]   Lives at home with son.    No Known Allergies  No current facility-administered medications on file prior to encounter.    No current outpatient prescriptions on file prior to encounter.     Physical Exam   Vitals:   05/02/16 1937  BP: 133/74  Pulse: 98  Resp: 18  Temp: 97.9 F (36.6 C)  TempSrc: Oral  Physical Exam  Constitutional: She is oriented to person, place, and time. She appears well-developed and well-nourished. No distress.  HENT:  Head: Normocephalic.  Neck: Neck supple.  Respiratory: Effort normal and breath sounds normal.  Neurological: She is alert and oriented to person, place, and time. She has normal reflexes.  Skin: Skin is warm and dry.  Psychiatric: She has a normal mood and affect.    MAU Course  Procedures Results for orders placed or performed during the hospital encounter of 05/02/16 (from the past 72 hour(s))  hCG, quantitative, pregnancy     Status: Abnormal   Collection Time: 05/02/16  7:19 PM  Result Value Ref Range   hCG, Beta Chain, Quant, S 2,224 (H) <5 mIU/mL    Comment:          GEST. AGE      CONC.  (mIU/mL)   <=1 WEEK        5 - 50     2 WEEKS       50 - 500     3 WEEKS       100 - 10,000     4 WEEKS     1,000 - 30,000     5 WEEKS     3,500 - 115,000   6-8 WEEKS     12,000 - 270,000    12 WEEKS     15,000 -  220,000        FEMALE AND NON-PREGNANT FEMALE:     LESS THAN 5 mIU/mL     Assessment and Plan  Suspected Miscarriage  Plan: Reviewed bleeding precautions Follow-up in office in one week > message routed to staff  Marlis EdelsonWalidah N Karim, CNM

## 2016-05-10 ENCOUNTER — Encounter: Payer: 59 | Admitting: Family Medicine

## 2016-05-12 ENCOUNTER — Telehealth: Payer: Self-pay | Admitting: *Deleted

## 2016-05-12 NOTE — Telephone Encounter (Addendum)
Pt left voice mail message on 11/6 stating that she had an appointment scheduled in our office for that Inmer Nix to get a D&C. Pt further stated that she did not feel she needed the procedure because she had experienced heavy bleeding with clots a week ago and thought she passed something which looked like tissue. She has now stopped bleeding completely and has no pain. Per chart review, pt's appointment was only for follow up of SAB and to determine if any additional care was indicated. Pt did not come to the appointment and still needs follow up.  I called her today and left a message that I was calling with information regarding her missed appointment. I requested her to call back and leave a message stating whether a detailed message can be left on her voice mail.   1450  Spoke w/pt and discussed her missed appt. I advised that she needs follow up of her medical condition even though her history is consistent with a complete miscarriage. Pt's last BHCG was performed on 05/02/16 - result 2,224. Per discussion, pt states that she does not desire a pregnancy at this time. I advised that we will discuss birth control options at the follow up visit. Meanwhile pt should use condoms.  Pt voiced understanding and stated that she has appt with [facility] on 11/14. This appt was originally to begin prenatal care however she will keep the appt as a follow up to her SAB.  I advised that she will not need appt in our office if she receives care from [facility].  Pt voiced understanding.

## 2017-02-25 ENCOUNTER — Encounter (HOSPITAL_COMMUNITY): Payer: Self-pay

## 2017-03-25 DIAGNOSIS — D509 Iron deficiency anemia, unspecified: Secondary | ICD-10-CM | POA: Diagnosis not present

## 2017-03-25 DIAGNOSIS — N912 Amenorrhea, unspecified: Secondary | ICD-10-CM | POA: Diagnosis not present

## 2017-03-28 DIAGNOSIS — N912 Amenorrhea, unspecified: Secondary | ICD-10-CM | POA: Diagnosis not present

## 2017-04-19 DIAGNOSIS — N911 Secondary amenorrhea: Secondary | ICD-10-CM | POA: Diagnosis not present

## 2017-04-26 ENCOUNTER — Encounter: Payer: Self-pay | Admitting: Cardiovascular Disease

## 2017-04-26 DIAGNOSIS — Z23 Encounter for immunization: Secondary | ICD-10-CM | POA: Diagnosis not present

## 2017-04-28 DIAGNOSIS — M461 Sacroiliitis, not elsewhere classified: Secondary | ICD-10-CM | POA: Diagnosis not present

## 2017-04-28 DIAGNOSIS — M4690 Unspecified inflammatory spondylopathy, site unspecified: Secondary | ICD-10-CM | POA: Diagnosis not present

## 2017-04-28 DIAGNOSIS — M064 Inflammatory polyarthropathy: Secondary | ICD-10-CM | POA: Diagnosis not present

## 2017-05-03 DIAGNOSIS — N760 Acute vaginitis: Secondary | ICD-10-CM | POA: Diagnosis not present

## 2017-05-10 ENCOUNTER — Encounter (HOSPITAL_COMMUNITY): Payer: Self-pay | Admitting: *Deleted

## 2017-05-10 ENCOUNTER — Inpatient Hospital Stay (HOSPITAL_COMMUNITY)
Admission: AD | Admit: 2017-05-10 | Discharge: 2017-05-10 | Disposition: A | Discharge status: Home / Self Care | Payer: 59 | Source: Ambulatory Visit | Attending: Obstetrics and Gynecology | Admitting: Obstetrics and Gynecology

## 2017-05-10 DIAGNOSIS — O26891 Other specified pregnancy related conditions, first trimester: Secondary | ICD-10-CM | POA: Insufficient documentation

## 2017-05-10 DIAGNOSIS — R109 Unspecified abdominal pain: Secondary | ICD-10-CM | POA: Diagnosis not present

## 2017-05-10 DIAGNOSIS — Z8249 Family history of ischemic heart disease and other diseases of the circulatory system: Secondary | ICD-10-CM | POA: Insufficient documentation

## 2017-05-10 DIAGNOSIS — Z3A10 10 weeks gestation of pregnancy: Secondary | ICD-10-CM

## 2017-05-10 DIAGNOSIS — Z87891 Personal history of nicotine dependence: Secondary | ICD-10-CM | POA: Diagnosis not present

## 2017-05-10 DIAGNOSIS — Z842 Family history of other diseases of the genitourinary system: Secondary | ICD-10-CM | POA: Insufficient documentation

## 2017-05-10 DIAGNOSIS — Z3491 Encounter for supervision of normal pregnancy, unspecified, first trimester: Secondary | ICD-10-CM

## 2017-05-10 DIAGNOSIS — N939 Abnormal uterine and vaginal bleeding, unspecified: Secondary | ICD-10-CM | POA: Diagnosis present

## 2017-05-10 DIAGNOSIS — O209 Hemorrhage in early pregnancy, unspecified: Secondary | ICD-10-CM | POA: Diagnosis not present

## 2017-05-10 LAB — URINALYSIS, ROUTINE W REFLEX MICROSCOPIC
Bacteria, UA: NONE SEEN
Bilirubin Urine: NEGATIVE
Glucose, UA: NEGATIVE mg/dL
KETONES UR: 20 mg/dL — AB
LEUKOCYTES UA: NEGATIVE
Nitrite: NEGATIVE
PH: 6 (ref 5.0–8.0)
Protein, ur: NEGATIVE mg/dL
SPECIFIC GRAVITY, URINE: 1.005 (ref 1.005–1.030)

## 2017-05-10 LAB — POCT PREGNANCY, URINE: Preg Test, Ur: POSITIVE — AB

## 2017-05-10 NOTE — MAU Note (Signed)
Pt reports vaginal bleeding since 3 pm, lower abd cramping.

## 2017-05-10 NOTE — Discharge Instructions (Signed)
Pelvic Rest Pelvic rest may be recommended if:  Your placenta is partially or completely covering the opening of your cervix (placenta previa).  There is bleeding between the wall of the uterus and the amniotic sac in the first trimester of pregnancy (subchorionic hemorrhage).  You went into labor too early (preterm labor).  Based on your overall health and the health of your baby, your health care provider will decide if pelvic rest is right for you. How do I rest my pelvis? For as long as told by your health care provider:  Do not have sex, sexual stimulation, or an orgasm.  Do not use tampons. Do not douche. Do not put anything in your vagina.  Do not lift anything that is heavier than 10 lb (4.5 kg).  Avoid activities that take a lot of effort (are strenuous).  Avoid any activity in which your pelvic muscles could become strained.  When should I seek medical care? Seek medical care if you have:  Cramping pain in your lower abdomen.  Vaginal discharge.  A low, dull backache.  Regular contractions.  Uterine tightening.  When should I seek immediate medical care? Seek immediate medical care if:  You have vaginal bleeding and you are pregnant.  This information is not intended to replace advice given to you by your health care provider. Make sure you discuss any questions you have with your health care provider. Document Released: 10/16/2010 Document Revised: 11/27/2015 Document Reviewed: 12/23/2014 Elsevier Interactive Patient Education  2018 [employer]     Vaginal Bleeding During Pregnancy, First Trimester A small amount of bleeding (spotting) from the vagina is relatively common in early pregnancy. It usually stops on its own. Various things may cause bleeding or spotting in early pregnancy. Some bleeding may be related to the pregnancy, and some may not. In most cases, the bleeding is normal and is not a problem. However, bleeding can also be a sign of  something serious. Be sure to tell your health care provider about any vaginal bleeding right away. Some possible causes of vaginal bleeding during the first trimester include:  Infection or inflammation of the cervix.  Growths (polyps) on the cervix.  Miscarriage or threatened miscarriage.  Pregnancy tissue has developed outside of the uterus and in a fallopian tube (tubal pregnancy).  Tiny cysts have developed in the uterus instead of pregnancy tissue (molar pregnancy).  Follow these instructions at home: Watch your condition for any changes. The following actions may help to lessen any discomfort you are feeling:  Follow your health care provider's instructions for limiting your activity. If your health care provider orders bed rest, you may need to stay in bed and only get up to use the bathroom. However, your health care provider may allow you to continue light activity.  If needed, make plans for someone to help with your regular activities and responsibilities while you are on bed rest.  Keep track of the number of pads you use each day, how often you change pads, and how soaked (saturated) they are. Write this down.  Do not use tampons. Do not douche.  Do not have sexual intercourse or orgasms until approved by your health care provider.  If you pass any tissue from your vagina, save the tissue so you can show it to your health care provider.  Only take over-the-counter or prescription medicines as directed by your health care provider.  Do not take aspirin because it can make you bleed.  Keep all follow-up appointments  as directed by your health care provider.  Contact a health care provider if:  You have any vaginal bleeding during any part of your pregnancy.  You have cramps or labor pains.  You have a fever, not controlled by medicine. Get help right away if:  You have severe cramps in your back or belly (abdomen).  You pass large clots or tissue from your  vagina.  Your bleeding increases.  You feel light-headed or weak, or you have fainting episodes.  You have chills.  You are leaking fluid or have a gush of fluid from your vagina.  You pass out while having a bowel movement. This information is not intended to replace advice given to you by your health care provider. Make sure you discuss any questions you have with your health care provider. Document Released: 03/31/2005 Document Revised: 11/27/2015 Document Reviewed: 02/26/2013 Elsevier Interactive Patient Education  [employer]2018 Elsevier Inc.

## 2017-05-10 NOTE — MAU Note (Signed)
Pt presents with c/o VB with clots that began today @ 3:00 this afternoon.  Reporst mild menstrual type cramping.

## 2017-05-10 NOTE — MAU Provider Note (Signed)
History     CSN: [id number]  Arrival date and time: 05/10/17 1558   First Provider Initiated Contact with Patient 05/10/17 1635      Chief Complaint  Patient presents with  . Vaginal Bleeding   HPI  Elizabeth Burke is a 31 y.o. G3P1011 at [redacted]w[redacted]d who presents with vaginal bleeding. Goes to [facility] for prenatal care; was last seen last week & had bedside ultrasound that confirmed IUP with cardiac activity.  Currently symptoms began this afternoon around 1 pm. Reports bleeding heavier than a period. Did not saturate pads but did pass some clots. Bleeding has slowed down since then. Also reports some lower abdominal cramping that she describes as "tolerable". Rates pain 2/10. Has not treated pain.  Last intercourse on Saturday.   OB History    Gravida Para Term Preterm AB Living   3 1 1   1 1    SAB TAB Ectopic Multiple Live Births   1       1      Past Medical History:  Diagnosis Date  . Allergy    Seasonal  . Arthritis    Spondyloarthropathy--followed by Dr Dierdre Forth  . GERD (gastroesophageal reflux disease) 2000 or so  . H/O hiatal hernia   . Headache(784.0)   . Neuromuscular disorder (HCC)    Pins and needles in right arm following surgery for hidradenitis.  . Scoliosis Dx2006  . Urinary tract infection     Past Surgical History:  Procedure Laterality Date  . FRACTURE SURGERY Left 2000   Left wrist--casted only, no surgery  . NOSE SURGERY N/A 2012   removal of fish hook     Family History  Problem Relation Age of Onset  . Cancer Maternal Grandmother        bladder and liver  . Diabetes Maternal Grandfather   . Cancer Maternal Grandfather        prostate  . Diabetes Paternal Grandmother   . Hypertension Mother   . Sleep apnea Father   . Ovarian cysts Sister        polycystic ovarian disease  . Arthritis Paternal Grandfather        unknown joint problem    Social History   Tobacco Use  . Smoking status: Former Smoker    Packs/day: 0.00    Years: 0.00    Pack years: 0.00    Start date: 07/05/2004  . Smokeless tobacco: Never Used  Substance Use Topics  . Alcohol use: No    Alcohol/week: 0.0 oz  . Drug use: Yes    Types: Marijuana    Allergies: No Known Allergies  Medications Prior to Admission  Medication Sig Dispense Refill Last Dose  . ibuprofen (ADVIL,MOTRIN) 200 MG tablet Take 600 mg every 6 (six) hours as needed by mouth for moderate pain.   05/09/2017 at Unknown time    Review of Systems  Constitutional: Negative.   Gastrointestinal: Positive for abdominal pain. Negative for constipation, diarrhea, nausea and vomiting.  Genitourinary: Positive for vaginal bleeding. Negative for dyspareunia, dysuria and vaginal discharge.   Physical Exam   Blood pressure 124/73, pulse 73, temperature 98.3 F (36.8 C), temperature source Oral, resp. rate 17, height 5' 7.75" (1.721 m), weight 165 lb (74.8 kg), last menstrual period 02/24/2017, SpO2 100 %, unknown if currently breastfeeding.  Physical Exam  Nursing note and vitals reviewed. Constitutional: She is oriented to person, place, and time. She appears well-developed and well-nourished. No distress.  HENT:  Head: Normocephalic  and atraumatic.  Eyes: Conjunctivae are normal. Right eye exhibits no discharge. Left eye exhibits no discharge. No scleral icterus.  Neck: Normal range of motion.  Respiratory: Effort normal. No respiratory distress.  GI: Soft. She exhibits no distension. There is no tenderness. There is no rebound and no guarding.  Genitourinary: Cervix exhibits no friability. There is bleeding (small amount of dark brown blood; no active bleeding. Cervix closed) in the vagina.  Neurological: She is alert and oriented to person, place, and time.  Skin: Skin is warm and dry. She is not diaphoretic.  Psychiatric: She has a normal mood and affect. Her behavior is normal. Judgment and thought content normal.    MAU Course  Procedures Results for orders placed or  performed during the hospital encounter of 05/10/17 (from the past 24 hour(s))  Urinalysis, Routine w reflex microscopic     Status: Abnormal   Collection Time: 05/10/17  4:10 PM  Result Value Ref Range   Color, Urine STRAW (A) YELLOW   APPearance CLEAR CLEAR   Specific Gravity, Urine 1.005 1.005 - 1.030   pH 6.0 5.0 - 8.0   Glucose, UA NEGATIVE NEGATIVE mg/dL   Hgb urine dipstick SMALL (A) NEGATIVE   Bilirubin Urine NEGATIVE NEGATIVE   Ketones, ur 20 (A) NEGATIVE mg/dL   Protein, ur NEGATIVE NEGATIVE mg/dL   Nitrite NEGATIVE NEGATIVE   Leukocytes, UA NEGATIVE NEGATIVE   RBC / HPF 0-5 0 - 5 RBC/hpf   WBC, UA 0-5 0 - 5 WBC/hpf   Bacteria, UA NONE SEEN NONE SEEN   Squamous Epithelial / LPF 0-5 (A) NONE SEEN  Pregnancy, urine POC     Status: Abnormal   Collection Time: 05/10/17  4:42 PM  Result Value Ref Range   Preg Test, Ur POSITIVE (A) NEGATIVE    MDM FHT 176 Cervix closed Small amount of dark brown blood on exam; no active bleeding B positive blood type  Discussed with Dr. Henderson Cloudomblin. Ok to discharge home.   Assessment and Plan  A:  1. Vaginal bleeding in pregnancy, first trimester   2. [redacted] weeks gestation of pregnancy   3. Fetal heart tones present, first trimester    P: Discharge home Pelvic rest No heavy lifting Discussed reasons to return to MAU Keep f/u with OB  Judeth HornErin Keleigh Kazee 05/10/2017, 4:35 PM

## 2017-06-01 ENCOUNTER — Encounter: Payer: Self-pay | Admitting: Cardiovascular Disease

## 2017-06-01 DIAGNOSIS — D649 Anemia, unspecified: Secondary | ICD-10-CM | POA: Diagnosis not present

## 2017-06-17 DIAGNOSIS — L732 Hidradenitis suppurativa: Secondary | ICD-10-CM | POA: Diagnosis not present

## 2017-07-05 NOTE — L&D Delivery Note (Signed)
I was called for precipitous delivery attended by faculty MD. NICU team present for resusitation  Delivery Note  SVD viable female Apgars to be given by NICU over intact perineum.  Placenta delivered spontaneously intact with 3VC. Good support and hemostasis noted. PH art was sent.   Mother and baby to couplet care and are doing well.  Baby to NICU  EBL 100cc  Candice Campavid Edrie Ehrich, MD

## 2017-07-15 DIAGNOSIS — Z6826 Body mass index (BMI) 26.0-26.9, adult: Secondary | ICD-10-CM | POA: Diagnosis not present

## 2017-07-15 DIAGNOSIS — L732 Hidradenitis suppurativa: Secondary | ICD-10-CM | POA: Diagnosis not present

## 2017-07-15 DIAGNOSIS — M029 Reactive arthropathy, unspecified: Secondary | ICD-10-CM | POA: Diagnosis not present

## 2017-08-02 ENCOUNTER — Encounter (HOSPITAL_COMMUNITY): Payer: Self-pay | Admitting: Emergency Medicine

## 2017-08-02 ENCOUNTER — Ambulatory Visit (HOSPITAL_COMMUNITY)
Admission: EM | Admit: 2017-08-02 | Discharge: 2017-08-02 | Disposition: A | Discharge status: Home / Self Care | Payer: 59 | Attending: Family Medicine | Admitting: Family Medicine

## 2017-08-02 DIAGNOSIS — K219 Gastro-esophageal reflux disease without esophagitis: Secondary | ICD-10-CM | POA: Diagnosis not present

## 2017-08-02 DIAGNOSIS — L02419 Cutaneous abscess of limb, unspecified: Secondary | ICD-10-CM | POA: Diagnosis not present

## 2017-08-02 DIAGNOSIS — L02412 Cutaneous abscess of left axilla: Secondary | ICD-10-CM | POA: Diagnosis not present

## 2017-08-02 DIAGNOSIS — Z87891 Personal history of nicotine dependence: Secondary | ICD-10-CM | POA: Insufficient documentation

## 2017-08-02 DIAGNOSIS — Z79899 Other long term (current) drug therapy: Secondary | ICD-10-CM | POA: Insufficient documentation

## 2017-08-02 DIAGNOSIS — L0390 Cellulitis, unspecified: Secondary | ICD-10-CM | POA: Diagnosis present

## 2017-08-02 NOTE — Discharge Instructions (Signed)
Please call general surgery for follow up for your chronic recurrent wounds.  I have cultured the drainage to see if additional antibiotics are warranted.  We will call you with any positive culture findings. Please continue with warm compresses and tylenol as needed for pain.

## 2017-08-02 NOTE — ED Triage Notes (Signed)
PT finished a course of clindamycin last week.

## 2017-08-02 NOTE — ED Triage Notes (Signed)
PT reports chronic open sores under left arm. PT has seen several doctors. PT is 5 months pregnant.

## 2017-08-02 NOTE — ED Provider Notes (Signed)
[facility]    CSN: [id number] Arrival date & time: 08/02/17  1715     History   Chief Complaint Chief Complaint  Patient presents with  . Cellulitis    HPI Elizabeth Burke is a 32 y.o. female.   Elizabeth Burke presents with complaints of chronic draining abscess to left axilla. 1 which has been present for at least a year, the others for months. Has seen her PCP, as well as dermatology and rheumatology for these symptoms. Patient has a history of HS, has required surgery for abscess in the past. She is 5 months pregnant, EDD 12/01/17. Dermatology recommended a medication which was not recommended during pregnancy. Rheumatology recommended a medication which insurance did not cover. Has used topical antibiotics as well as oral keflex as well as clindamycin. Completed clindamycin 1 week ago. Uses epsom salt soaks and witch hazel as well. Odor to drainage, painful. Per rheumatology takes NSAIDs daily for pain management. No known fevers.    ROS per HPI.       Past Medical History:  Diagnosis Date  . Allergy    Seasonal  . Arthritis    Spondyloarthropathy--followed by Dr Dierdre ForthBeekman  . GERD (gastroesophageal reflux disease) 2000 or so  . H/O hiatal hernia   . Headache(784.0)   . Neuromuscular disorder (HCC)    Pins and needles in right arm following surgery for hidradenitis.  . Scoliosis Dx2006  . Urinary tract infection     Patient Active Problem List   Diagnosis Date Noted  . Hidradenitis 07/31/2013  . Post-operative hemorrhage 07/31/2013  . Hidradenitis suppurativa 07/06/2013    Past Surgical History:  Procedure Laterality Date  . FRACTURE SURGERY Left 2000   Left wrist--casted only, no surgery  . HYDRADENITIS EXCISION Bilateral 07/31/2013   Procedure: EXCISION HYDRADENITIS RIGHT AXILLA  AND LEFT GROIN ;  Surgeon: Shelly Rubensteinouglas A Blackman, MD;  Location: [facility] OR;  Service: General;  Laterality: Bilateral;  . NOSE SURGERY N/A 2012   removal of fish hook     OB  History    Gravida Para Term Preterm AB Living   3 1 1   1 1    SAB TAB Ectopic Multiple Live Births   1       1       Home Medications    Prior to Admission medications   Medication Sig Start Date End Date Taking? Authorizing Provider  progesterone (PROMETRIUM) 100 MG capsule TAKE 1 CAPSULE BY MOUTH EVERYDAY AT BEDTIME 03/30/17   [provider]    Family History Family History  Problem Relation Age of Onset  . Cancer Maternal Grandmother        bladder and liver  . Diabetes Maternal Grandfather   . Cancer Maternal Grandfather        prostate  . Diabetes Paternal Grandmother   . Hypertension Mother   . Sleep apnea Father   . Ovarian cysts Sister        polycystic ovarian disease  . Arthritis Paternal Grandfather        unknown joint problem    Social History Social History   Tobacco Use  . Smoking status: Former Smoker    Packs/day: 0.00    Years: 0.00    Pack years: 0.00    Start date: 07/05/2004  . Smokeless tobacco: Never Used  Substance Use Topics  . Alcohol use: No    Alcohol/week: 0.0 oz  . Drug use: Yes    Types: Marijuana  Allergies   Patient has no known allergies.   Review of Systems Review of Systems   Physical Exam Triage Vital Signs ED Triage Vitals  Enc Vitals Group     BP 08/02/17 1732 135/69     Pulse Rate 08/02/17 1731 (!) 102     Resp 08/02/17 1731 16     Temp 08/02/17 1731 99.6 F (37.6 C)     Temp Source 08/02/17 1731 Temporal     SpO2 08/02/17 1731 97 %     Weight 08/02/17 1732 180 lb (81.6 kg)     Height 08/02/17 1732 5\' 8"  (1.727 m)     Head Circumference --      Peak Flow --      Pain Score 08/02/17 1732 9     Pain Loc --      Pain Edu? --      Excl. in GC? --    No data found.  Updated Vital Signs BP 135/69   Pulse (!) 102   Temp 99.6 F (37.6 C) (Temporal)   Resp 16   Ht 5\' 8"  (1.727 m)   Wt 180 lb (81.6 kg)   LMP 02/24/2017   SpO2 97%   BMI 27.37 kg/m   Visual Acuity Right Eye Distance:    Left Eye Distance:   Bilateral Distance:    Right Eye Near:   Left Eye Near:    Bilateral Near:     Physical Exam  Constitutional: She is oriented to person, place, and time. She appears well-developed and well-nourished. No distress.  Cardiovascular: Normal rate, regular rhythm and normal heart sounds.  Pulmonary/Chest: Effort normal and breath sounds normal.  Neurological: She is alert and oriented to person, place, and time.  Skin: Skin is warm and dry.  Approximately 5 draining areas to left axilla; thick green purulent discharge noted; tender; open      UC Treatments / Results  Labs (all labs ordered are listed, but only abnormal results are displayed) Labs Reviewed  AEROBIC CULTURE (SUPERFICIAL SPECIMEN)    EKG  EKG Interpretation None       Radiology No results found.  Procedures Procedures (including critical care time)  Medications Ordered in UC Medications - No data to display   Initial Impression / Assessment and Plan / UC Course  I have reviewed the triage vital signs and the nursing notes.  Pertinent labs & imaging results that were available during my care of the patient were reviewed by me and considered in my medical decision making (see chart for details).     Patient with history of non healing abscesses after incision, areas are currently actively draining, deferred further incision at this time. Culture obtained to see if further antibiotics are warranted. Recommended general surgery follow up for more definitive treatment. Patient verbalized understanding and agreeable to plan.    Final Clinical Impressions(s) / UC Diagnoses   Final diagnoses:  Axillary abscess    ED Discharge Orders    None       Controlled Substance Prescriptions [place] Controlled Substance Registry consulted? Not Applicable   Georgetta Haber, NP 08/02/17 Rickey Primus

## 2017-08-05 LAB — AEROBIC CULTURE W GRAM STAIN (SUPERFICIAL SPECIMEN): Culture: NORMAL

## 2017-08-05 LAB — AEROBIC CULTURE  (SUPERFICIAL SPECIMEN)

## 2017-08-29 ENCOUNTER — Encounter: Payer: Self-pay | Admitting: Cardiovascular Disease

## 2017-08-29 DIAGNOSIS — Z23 Encounter for immunization: Secondary | ICD-10-CM | POA: Diagnosis not present

## 2017-08-29 DIAGNOSIS — Z3480 Encounter for supervision of other normal pregnancy, unspecified trimester: Secondary | ICD-10-CM | POA: Diagnosis not present

## 2017-09-08 DIAGNOSIS — M064 Inflammatory polyarthropathy: Secondary | ICD-10-CM | POA: Diagnosis not present

## 2017-09-08 DIAGNOSIS — M4690 Unspecified inflammatory spondylopathy, site unspecified: Secondary | ICD-10-CM | POA: Diagnosis not present

## 2017-09-08 DIAGNOSIS — M461 Sacroiliitis, not elsewhere classified: Secondary | ICD-10-CM | POA: Diagnosis not present

## 2017-09-13 ENCOUNTER — Telehealth: Payer: Self-pay

## 2017-09-13 ENCOUNTER — Telehealth: Payer: Self-pay | Admitting: Cardiovascular Disease

## 2017-09-13 NOTE — Telephone Encounter (Signed)
Received records  from [facility] on 09/13/17. Appt 09/20/17 @ 3:00am. [place]

## 2017-09-13 NOTE — Telephone Encounter (Signed)
NOTES FAXED TO [facility] °

## 2017-09-20 ENCOUNTER — Ambulatory Visit: Payer: 59 | Admitting: Cardiovascular Disease

## 2017-09-20 ENCOUNTER — Encounter: Payer: Self-pay | Admitting: Cardiovascular Disease

## 2017-09-20 VITALS — BP 129/80 | HR 93 | Ht 67.0 in | Wt 184.4 lb

## 2017-09-20 DIAGNOSIS — I951 Orthostatic hypotension: Secondary | ICD-10-CM | POA: Diagnosis not present

## 2017-09-20 DIAGNOSIS — R55 Syncope and collapse: Secondary | ICD-10-CM | POA: Diagnosis not present

## 2017-09-20 HISTORY — DX: Syncope and collapse: R55

## 2017-09-20 HISTORY — DX: Orthostatic hypotension: I95.1

## 2017-09-20 NOTE — Progress Notes (Addendum)
Cardiology Office Note   Date:  09/20/2017   ID:  Elizabeth Burke, DOB [birth year], MRN [id number]  PCP:  Gaspar Garbeisovec, Richard W, MD  Cardiologist:   Chilton Siiffany Montello, MD   Chief Complaint  Patient presents with  . New Patient (Initial Visit)      History of Present Illness: Elizabeth Burke is a 32 y.o. female pregnant (6029 weeks) female with reactive arthritis and prior tobacco abuse who presents for an evaluation of syncope. Two weeks ago she had and episode of syncope while standing.  She had been standing for a whiledoing her son's hair when she felt lightheaded and sat down.  After standing back up she felt very lightheaded and started walking into antoher room and passed out.  She felt her heat racing before passing out but had no chest pain or shortness of breath.  She had issues with lightheadedness past but it has been worse since she has been pregnant.  She has no prior episodes of syncope.  She has been eating and drinking well.  She does not get much formal exercise but has no exertional symptoms.  She denies lower extremity edema, orthopnea, or PND.  She quit smoking several months ago. She saw her OB/GYN, Dr. Elon SpannerLeger and was referred to cardiology for further evaluation.   Past Medical History:  Diagnosis Date  . Allergy    Seasonal  . Arthritis    Spondyloarthropathy--followed by Dr Dierdre ForthBeekman  . GERD (gastroesophageal reflux disease) 2000 or so  . H/O hiatal hernia   . Headache(784.0)   . Neuromuscular disorder (HCC)    Pins and needles in right arm following surgery for hidradenitis.  . Scoliosis Dx2006  . Urinary tract infection     Past Surgical History:  Procedure Laterality Date  . FRACTURE SURGERY Left 2000   Left wrist--casted only, no surgery  . HYDRADENITIS EXCISION Bilateral 07/31/2013   Procedure: EXCISION HYDRADENITIS RIGHT AXILLA  AND LEFT GROIN ;  Surgeon: Shelly Rubensteinouglas A Blackman, MD;  Location: [facility] OR;  Service: General;  Laterality: Bilateral;  . NOSE  SURGERY N/A 2012   removal of fish hook      Current Outpatient Medications  Medication Sig Dispense Refill  . PREDNISONE PO Take by mouth.    . Prenatal Vit-Fe Fumarate-FA (PRENATAL MULTIVITAMIN) TABS tablet Take 1 tablet by mouth daily at 12 noon.     No current facility-administered medications for this visit.     Allergies:   Patient has no known allergies.    Social History:  The patient  reports that she has quit smoking. She started smoking about 13 years ago. She smoked 0.00 packs per day for 0.00 years. she has never used smokeless tobacco. She reports that she uses drugs. Drug: Marijuana. She reports that she does not drink alcohol.   Family History:  The patient's family history includes Arthritis in her paternal grandfather; Cancer in her maternal grandfather and maternal grandmother; Diabetes in her maternal grandfather and paternal grandmother; Hypertension in her mother; Ovarian cysts in her sister; Sleep apnea in her father.    ROS:  Please see the history of present illness.   Otherwise, review of systems are positive for none.   All other systems are reviewed and negative.    PHYSICAL EXAM: VS:  BP 129/80   Pulse 93   Ht 5\' 7"  (1.702 m)   Wt 184 lb 6.4 oz (83.6 kg)   LMP 02/24/2017   BMI 28.88 kg/m  , BMI  Body mass index is 28.88 kg/m. GENERAL:  Well appearing HEENT:  Pupils equal round and reactive, fundi not visualized, oral mucosa unremarkable NECK:  No jugular venous distention, waveform within normal limits, carotid upstroke brisk and symmetric, no bruits, no thyromegaly LYMPHATICS:  No cervical adenopathy LUNGS:  Clear to auscultation bilaterally HEART: Tachycardic.  Regular rhythm.Marland Kitchen  PMI not displaced or sustained,S1 and S2 within normal limits, no S3, no S4, no clicks, no rubs, no murmurs ABD:  Flat, positive bowel sounds normal in frequency in pitch, no bruits, no rebound, no guarding, no midline pulsatile mass, no hepatomegaly, no splenomegaly EXT:   2 plus pulses throughout, no edema, no cyanosis no clubbing SKIN:  No rashes no nodules NEURO:  Cranial nerves II through XII grossly intact, motor grossly intact throughout PSYCH:  Cognitively intact, oriented to person place and time   EKG:  EKG is ordered today. The ekg ordered today demonstrates sinus tachycardia. Rate 101 bpm.     Recent Labs: No results found for requested labs within last 8760 hours.    Lipid Panel No results found for: CHOL, TRIG, HDL, CHOLHDL, VLDL, LDLCALC, LDLDIRECT    Wt Readings from Last 3 Encounters:  09/20/17 184 lb 6.4 oz (83.6 kg)  08/02/17 180 lb (81.6 kg)  05/10/17 165 lb (74.8 kg)      ASSESSMENT AND PLAN:  # Syncope: Likely orthostatic hypotension.  No recurrent episodes.  There may be a component of POTS.  Her heart rate increase substantially with orthostatic vital checks today.  I recommended that she drink at least 2 L of fluids daily.  Also recommended that she wear compression socks and increase her salt intake.  We will get an echocardiogram to ensure that she does not have any structural heart disease.  It does not appear so on exam   Current medicines are reviewed at length with the patient today.  The patient does not have concerns regarding medicines.  The following changes have been made:  no change  Labs/ tests ordered today include:  No orders of the defined types were placed in this encounter.    Disposition:   FU with Dinorah C. Duke Salvia, MD, [facility] in 3 months.     This note was written with the assistance of speech recognition software.  Please excuse any transcriptional errors.  Signed, Avaree C. Duke Salvia, MD, [facility]  09/20/2017 3:33 PM    [facility]

## 2017-09-20 NOTE — Patient Instructions (Signed)
Medication Instructions:  Your physician recommends that you continue on your current medications as directed. Please refer to the Current Medication list given to you today.  Labwork: NONE   Testing/Procedures: Your physician has requested that you have an echocardiogram. Echocardiography is a painless test that uses sound waves to create images of your heart. It provides your doctor with information about the size and shape of your heart and how well your heart's chambers and valves are working. This procedure takes approximately one hour. There are no restrictions for this procedure. [facility] AT [address]  Follow-Up: Your physician recommends that you schedule a follow-up appointment in: 3 MONTH OV   Any Other Special Instructions Will Be Listed Below (If Applicable). DRINK AT LEAST 2 LITERS A DAY   TRY COMPRESSION STOCKINGS   INCREASE YOUR SALT INTAKE   If you need a refill on your cardiac medications before your next appointment, please call your pharmacy.  Echocardiogram An echocardiogram, or echocardiography, uses sound waves (ultrasound) to produce an image of your heart. The echocardiogram is simple, painless, obtained within a short period of time, and offers valuable information to your health care provider. The images from an echocardiogram can provide information such as:  Evidence of coronary artery disease (CAD).  Heart size.  Heart muscle function.  Heart valve function.  Aneurysm detection.  Evidence of a past heart attack.  Fluid buildup around the heart.  Heart muscle thickening.  Assess heart valve function.  Tell a health care provider about:  Any allergies you have.  All medicines you are taking, including vitamins, herbs, eye drops, creams, and over-the-counter medicines.  Any problems you or family members have had with anesthetic medicines.  Any blood disorders you have.  Any surgeries you have had.  Any medical  conditions you have.  Whether you are pregnant or may be pregnant. What happens before the procedure? No special preparation is needed. Eat and drink normally. What happens during the procedure?  In order to produce an image of your heart, gel will be applied to your chest and a wand-like tool (transducer) will be moved over your chest. The gel will help transmit the sound waves from the transducer. The sound waves will harmlessly bounce off your heart to allow the heart images to be captured in real-time motion. These images will then be recorded.  You may need an IV to receive a medicine that improves the quality of the pictures. What happens after the procedure? You may return to your normal schedule including diet, activities, and medicines, unless your health care provider tells you otherwise. This information is not intended to replace advice given to you by your health care provider. Make sure you discuss any questions you have with your health care provider. Document Released: 06/18/2000 Document Revised: 02/07/2016 Document Reviewed: 02/26/2013 Elsevier Interactive Patient Education  2017 [employer]   How to Use Compression Stockings Compression stockings are elastic socks that squeeze the legs. They help to increase blood flow to the legs, decrease swelling in the legs, and reduce the chance of developing blood clots in the lower legs. Compression stockings are often used by people who:  Are recovering from surgery.  Have poor circulation in their legs.  Are prone to getting blood clots in their legs.  Have varicose veins.  Sit or stay in bed for long periods of time.  How to use compression stockings Before you put on your compression stockings:  Make sure that  they are the correct size. If you do not know your size, ask your health care provider.  Make sure that they are clean, dry, and in good condition.  Check them for rips and tears. Do not put them on if they  are ripped or torn.  Put your stockings on first thing in the morning, before you get out of bed. Keep them on for as long as your health care provider advises. When you are wearing your stockings:  Keep them as smooth as possible. Do not allow them to bunch up. It is especially important to prevent the stockings from bunching up around your toes or behind your knees.  Do not roll the stockings downward and leave them rolled down. This can decrease blood flow to your leg.  Change them right away if they become wet or dirty.  When you take off your stockings, inspect your legs and feet. Anything that does not seem normal may require medical attention. Look for:  Open sores.  Red spots.  Swelling.  Information and tips  Do not stop wearing your compression stockings without talking to your health care provider first.  Wash your stockings every day with mild detergent in cold or warm water. Do not use bleach. Air-dry your stockings or dry them in a clothes dryer on low heat.  Replace your stockings every 3-6 months.  If skin moisturizing is part of your treatment plan, apply lotion or cream at night so that your skin will be dry when you put on the stockings in the morning. It is harder to put the stockings on when you have lotion on your legs or feet. Contact a health care provider if: Remove your stockings and seek medical care if:  You have a feeling of pins and needles in your feet or legs.  You have any new changes in your skin.  You have skin lesions that are getting worse.  You have swelling or pain that is getting worse.  Get help right away if:  You have numbness or tingling in your lower legs that does not get better right after you take the stockings off.  Your toes or feet become cold and blue.  You develop open sores or red spots on your legs that do not go away.  You see or feel a warm spot on your leg.  You have new swelling or soreness in your leg.  You  are short of breath or you have chest pain for no reason.  You have a rapid or irregular heartbeat.  You feel light-headed or dizzy. This information is not intended to replace advice given to you by your health care provider. Make sure you discuss any questions you have with your health care provider. Document Released: 04/18/2009 Document Revised: 11/19/2015 Document Reviewed: 05/29/2014 Elsevier Interactive Patient Education  [employer].

## 2017-10-05 ENCOUNTER — Other Ambulatory Visit: Payer: Self-pay

## 2017-10-05 ENCOUNTER — Ambulatory Visit (HOSPITAL_COMMUNITY): Payer: 59 | Attending: Cardiovascular Disease

## 2017-10-05 DIAGNOSIS — R55 Syncope and collapse: Secondary | ICD-10-CM | POA: Insufficient documentation

## 2017-10-05 DIAGNOSIS — Z87891 Personal history of nicotine dependence: Secondary | ICD-10-CM | POA: Diagnosis not present

## 2017-10-05 DIAGNOSIS — I071 Rheumatic tricuspid insufficiency: Secondary | ICD-10-CM | POA: Diagnosis not present

## 2017-10-09 ENCOUNTER — Encounter (HOSPITAL_COMMUNITY): Payer: Self-pay

## 2017-10-09 ENCOUNTER — Inpatient Hospital Stay (HOSPITAL_COMMUNITY)
Admission: AD | Admit: 2017-10-09 | Discharge: 2017-10-11 | DRG: 807 | Disposition: A | Discharge status: Home / Self Care | Payer: 59 | Source: Ambulatory Visit | Attending: Obstetrics and Gynecology | Admitting: Obstetrics and Gynecology

## 2017-10-09 ENCOUNTER — Other Ambulatory Visit: Payer: Self-pay

## 2017-10-09 DIAGNOSIS — O42013 Preterm premature rupture of membranes, onset of labor within 24 hours of rupture, third trimester: Secondary | ICD-10-CM | POA: Diagnosis not present

## 2017-10-09 DIAGNOSIS — Z3A32 32 weeks gestation of pregnancy: Secondary | ICD-10-CM

## 2017-10-09 DIAGNOSIS — O42913 Preterm premature rupture of membranes, unspecified as to length of time between rupture and onset of labor, third trimester: Principal | ICD-10-CM | POA: Diagnosis present

## 2017-10-09 DIAGNOSIS — Z3A00 Weeks of gestation of pregnancy not specified: Secondary | ICD-10-CM | POA: Diagnosis not present

## 2017-10-09 DIAGNOSIS — Z87891 Personal history of nicotine dependence: Secondary | ICD-10-CM | POA: Diagnosis not present

## 2017-10-09 LAB — URINALYSIS, ROUTINE W REFLEX MICROSCOPIC
Bacteria, UA: NONE SEEN
Bilirubin Urine: NEGATIVE
Glucose, UA: NEGATIVE mg/dL
Hgb urine dipstick: NEGATIVE
Ketones, ur: NEGATIVE mg/dL
Nitrite: NEGATIVE
Protein, ur: NEGATIVE mg/dL
RBC / HPF: NONE SEEN RBC/hpf (ref 0–5)
Specific Gravity, Urine: 1.014 (ref 1.005–1.030)
pH: 6 (ref 5.0–8.0)

## 2017-10-09 LAB — CBC
HEMATOCRIT: 30.8 % — AB (ref 36.0–46.0)
HEMOGLOBIN: 9.9 g/dL — AB (ref 12.0–15.0)
MCH: 24.6 pg — ABNORMAL LOW (ref 26.0–34.0)
MCHC: 32.1 g/dL (ref 30.0–36.0)
MCV: 76.6 fL — AB (ref 78.0–100.0)
Platelets: 480 10*3/uL — ABNORMAL HIGH (ref 150–400)
RBC: 4.02 MIL/uL (ref 3.87–5.11)
RDW: 14.5 % (ref 11.5–15.5)
WBC: 15.4 10*3/uL — ABNORMAL HIGH (ref 4.0–10.5)

## 2017-10-09 LAB — TYPE AND SCREEN
ABO/RH(D): B POS
ANTIBODY SCREEN: NEGATIVE

## 2017-10-09 LAB — POCT FERN TEST: POCT FERN TEST: POSITIVE

## 2017-10-09 LAB — RPR: RPR: NONREACTIVE

## 2017-10-09 MED ORDER — DIPHENHYDRAMINE HCL 25 MG PO CAPS: 25.0000 mg | ORAL_CAPSULE | Freq: Four times a day (QID) | ORAL | Status: DC | PRN

## 2017-10-09 MED ORDER — DIBUCAINE 1 % RE OINT
1.0000 "application " | TOPICAL_OINTMENT | RECTAL | Status: DC | PRN
  Filled 2017-10-09: qty 28

## 2017-10-09 MED ORDER — BENZOCAINE-MENTHOL 20-0.5 % EX AERO
1.0000 "application " | INHALATION_SPRAY | CUTANEOUS | Status: DC | PRN
  Filled 2017-10-09 (×2): qty 56

## 2017-10-09 MED ORDER — SIMETHICONE 80 MG PO CHEW: 80.0000 mg | CHEWABLE_TABLET | ORAL | Status: DC | PRN

## 2017-10-09 MED ORDER — OXYCODONE-ACETAMINOPHEN 5-325 MG PO TABS: 1.0000 | ORAL_TABLET | ORAL | Status: DC | PRN

## 2017-10-09 MED ORDER — PRENATAL MULTIVITAMIN CH
1.0000 | ORAL_TABLET | Freq: Every day | ORAL | Status: DC
  Administered 2017-10-09 – 2017-10-11 (×3): 1 via ORAL
  Filled 2017-10-09 (×3): qty 1

## 2017-10-09 MED ORDER — OXYCODONE-ACETAMINOPHEN 5-325 MG PO TABS: 2.0000 | ORAL_TABLET | ORAL | Status: DC | PRN

## 2017-10-09 MED ORDER — FENTANYL CITRATE (PF) 100 MCG/2ML IJ SOLN
50.0000 ug | Freq: Once | INTRAMUSCULAR | Status: AC | PRN
  Administered 2017-10-09: 50 ug via INTRAVENOUS
  Filled 2017-10-09: qty 2

## 2017-10-09 MED ORDER — TETANUS-DIPHTH-ACELL PERTUSSIS 5-2.5-18.5 LF-MCG/0.5 IM SUSP: 0.5000 mL | Freq: Once | INTRAMUSCULAR | Status: DC

## 2017-10-09 MED ORDER — FLEET ENEMA 7-19 GM/118ML RE ENEM: 1.0000 | ENEMA | RECTAL | Status: DC | PRN

## 2017-10-09 MED ORDER — BETAMETHASONE SOD PHOS & ACET 6 (3-3) MG/ML IJ SUSP
12.0000 mg | Freq: Once | INTRAMUSCULAR | Status: AC
  Administered 2017-10-09: 12 mg via INTRAMUSCULAR
  Filled 2017-10-09: qty 2

## 2017-10-09 MED ORDER — ZOLPIDEM TARTRATE 5 MG PO TABS: 5.0000 mg | ORAL_TABLET | Freq: Every evening | ORAL | Status: DC | PRN

## 2017-10-09 MED ORDER — IBUPROFEN 600 MG PO TABS
600.0000 mg | ORAL_TABLET | Freq: Four times a day (QID) | ORAL | Status: DC
  Administered 2017-10-09 – 2017-10-11 (×10): 600 mg via ORAL
  Filled 2017-10-09 (×10): qty 1

## 2017-10-09 MED ORDER — MAGNESIUM SULFATE BOLUS VIA INFUSION
4.0000 g | Freq: Once | INTRAVENOUS | Status: AC
  Administered 2017-10-09: 4 g via INTRAVENOUS
  Filled 2017-10-09: qty 500

## 2017-10-09 MED ORDER — LACTATED RINGERS IV SOLN
INTRAVENOUS | Status: DC
  Administered 2017-10-09: 05:00:00 via INTRAVENOUS

## 2017-10-09 MED ORDER — LIDOCAINE HCL (PF) 1 % IJ SOLN
30.0000 mL | INTRAMUSCULAR | Status: DC | PRN
  Filled 2017-10-09: qty 30

## 2017-10-09 MED ORDER — MEDROXYPROGESTERONE ACETATE 150 MG/ML IM SUSP: 150.0000 mg | INTRAMUSCULAR | Status: DC | PRN

## 2017-10-09 MED ORDER — COCONUT OIL OIL
1.0000 "application " | TOPICAL_OIL | Status: DC | PRN
  Filled 2017-10-09: qty 120

## 2017-10-09 MED ORDER — WITCH HAZEL-GLYCERIN EX PADS: 1.0000 "application " | MEDICATED_PAD | CUTANEOUS | Status: DC | PRN

## 2017-10-09 MED ORDER — ACETAMINOPHEN 325 MG PO TABS: 650.0000 mg | ORAL_TABLET | ORAL | Status: DC | PRN

## 2017-10-09 MED ORDER — SOD CITRATE-CITRIC ACID 500-334 MG/5ML PO SOLN: 30.0000 mL | ORAL | Status: DC | PRN

## 2017-10-09 MED ORDER — ONDANSETRON HCL 4 MG/2ML IJ SOLN: 4.0000 mg | Freq: Four times a day (QID) | INTRAMUSCULAR | Status: DC | PRN

## 2017-10-09 MED ORDER — ONDANSETRON HCL 4 MG/2ML IJ SOLN: 4.0000 mg | INTRAMUSCULAR | Status: DC | PRN

## 2017-10-09 MED ORDER — ONDANSETRON HCL 4 MG PO TABS: 4.0000 mg | ORAL_TABLET | ORAL | Status: DC | PRN

## 2017-10-09 MED ORDER — MAGNESIUM SULFATE 40 G IN LACTATED RINGERS - SIMPLE
2.0000 g/h | INTRAVENOUS | Status: DC
  Filled 2017-10-09: qty 500

## 2017-10-09 MED ORDER — OXYTOCIN BOLUS FROM INFUSION
500.0000 mL | Freq: Once | INTRAVENOUS | Status: AC
  Administered 2017-10-09: 500 mL via INTRAVENOUS

## 2017-10-09 MED ORDER — SODIUM CHLORIDE 0.9 % IV SOLN
5.0000 10*6.[IU] | Freq: Once | INTRAVENOUS | Status: AC
  Administered 2017-10-09: 5 10*6.[IU] via INTRAVENOUS
  Filled 2017-10-09: qty 5

## 2017-10-09 MED ORDER — OXYTOCIN 40 UNITS IN LACTATED RINGERS INFUSION - SIMPLE MED
2.5000 [IU]/h | INTRAVENOUS | Status: DC
  Filled 2017-10-09 (×2): qty 1000

## 2017-10-09 MED ORDER — LACTATED RINGERS IV SOLN: 500.0000 mL | INTRAVENOUS | Status: DC | PRN

## 2017-10-09 MED ORDER — SENNOSIDES-DOCUSATE SODIUM 8.6-50 MG PO TABS
2.0000 | ORAL_TABLET | ORAL | Status: DC
  Administered 2017-10-10 – 2017-10-11 (×2): 2 via ORAL
  Filled 2017-10-09 (×2): qty 2

## 2017-10-09 MED ORDER — PENICILLIN G POT IN DEXTROSE 60000 UNIT/ML IV SOLN
3.0000 10*6.[IU] | INTRAVENOUS | Status: DC
  Filled 2017-10-09 (×2): qty 50

## 2017-10-09 MED ORDER — MEASLES, MUMPS & RUBELLA VAC ~~LOC~~ INJ
0.5000 mL | INJECTION | Freq: Once | SUBCUTANEOUS | Status: DC
  Filled 2017-10-09: qty 0.5

## 2017-10-09 NOTE — MAU Provider Note (Signed)
Chief Complaint  Patient presents with  . Rupture of Membranes    First Provider Initiated Contact with Patient 10/09/17 0439     S: Elizabeth Burke  is a 32 y.o. y.o. year old [id number] female at 5453w3d weeks gestation who presents to MAU reporting leaking moderate amount of clear fluid since 0315.   Contractions: Rare initially, but now ~Q7, feeling rectal pressure  Vaginal bleeding: Denies Fetal movement: Nml  O:  Patient Vitals for the past 24 hrs:  BP Temp Pulse Resp Height Weight  10/09/17 0337 125/76 98.3 F (36.8 C) (!) 102 18 5\' 7"  (1.702 m) 187 lb (84.8 kg)   General: NAD Heart: Regular rate Lungs: Normal rate and effort Abd: Soft, NT, Gravid, S=D Pelvic: NEFG, grossly ruptured, no blood.  Dilation: 5 Effacement (%): 100 Cervical Position: Middle Station: -1 Presentation: Vertex Exam by:: Dorathy KinsmanVirginia [place] Scheiderer CNM  EFM: 150, Moderate variability, 15 x 15 accelerations, no decelerations Toco: Frequent UI  Pos Fern  Assessment: 1. Labor: PPROM, early labor 2. Fetal Wellbeing: Category I  3. Pain Control: Requesting IV pain meds 4. GBS: Unknown 5. 32.3 week IUP  Plan:  1. Admit to L&D per consult w/ [name], [name], MD. 2. Routine L&D orders 3. Meg Sulfate 4. BMZ 5. PCN for unknown GBS  [name], [place], [place] 10/09/2017 4:23 AM  2

## 2017-10-09 NOTE — MAU Note (Signed)
Awoke about ago and in puddle of fld. Cont to leak and having some mild cramping. Fld was clear/yellow

## 2017-10-09 NOTE — H&P (Signed)
Elizabeth Burke is a 32 y.o. female presenting for PPROM clear fluid.  No labor sxs on presentation but after feeling pressure was checked and sent to L&D at 5cm.  Pregnancy uncomplicated.. OB History    Gravida  3   Para  1   Term  1   Preterm      AB  1   Living  1     SAB  1   TAB      Ectopic      Multiple      Live Births  1          Past Medical History:  Diagnosis Date  . Allergy    Seasonal  . Arthritis    Spondyloarthropathy--followed by Dr Dierdre ForthBeekman  . GERD (gastroesophageal reflux disease) 2000 or so  . H/O hiatal hernia   . Headache(784.0)   . Neuromuscular disorder (HCC)    Pins and needles in right arm following surgery for hidradenitis.  . Orthostatic hypotension 09/20/2017  . Scoliosis Dx2006  . Syncope 09/20/2017  . Urinary tract infection    Past Surgical History:  Procedure Laterality Date  . FRACTURE SURGERY Left 2000   Left wrist--casted only, no surgery  . HYDRADENITIS EXCISION Bilateral 07/31/2013   Procedure: EXCISION HYDRADENITIS RIGHT AXILLA  AND LEFT GROIN ;  Surgeon: Shelly Rubensteinouglas A Blackman, MD;  Location: [facility] OR;  Service: General;  Laterality: Bilateral;  . NOSE SURGERY N/A 2012   removal of fish hook    Family History: family history includes Arthritis in her paternal grandfather; Cancer in her maternal grandfather and maternal grandmother; Diabetes in her maternal grandfather and paternal grandmother; Hypertension in her mother; Ovarian cysts in her sister; Sleep apnea in her father. Social History:  reports that she has quit smoking. She started smoking about 13 years ago. She smoked 0.00 packs per day for 0.00 years. She has never used smokeless tobacco. She reports that she has current or past drug history. Drug: Marijuana. She reports that she does not drink alcohol.     Maternal Diabetes: No Genetic Screening: Normal Maternal Ultrasounds/Referrals: Normal Fetal Ultrasounds or other Referrals:  None Maternal Substance Abuse:   No Significant Maternal Medications:  None Significant Maternal Lab Results:  None Other Comments:  None  ROS History Dilation: 10 Effacement (%): 100 Station: Plus 3 Exam by:: B Boyer,RN Blood pressure 115/68, pulse (!) 104, temperature 98.6 F (37 C), temperature source Oral, resp. rate 16, height 5\' 7"  (1.702 m), weight 187 lb (84.8 kg), last menstrual period 02/24/2017, unknown if currently breastfeeding. Exam Physical Exam  Prenatal labs: ABO, Rh: --/--/B POS (04/07 0505) Antibody: NEG (04/07 0505) Rubella:   RPR:    HBsAg:    HIV:    GBS:     Assessment/Plan: IUP at 32 weeks Admitted for PPROM for BMZ, Mag to try to complete series, Abx for unknown GBS On admission for L&D, precitous delivery   Wess Baney C 10/09/2017, 6:38 AM

## 2017-10-09 NOTE — Progress Notes (Signed)
Pt up to NICU via WC to visit infant prior to arriving in 303.

## 2017-10-10 LAB — CBC
HEMATOCRIT: 29.5 % — AB (ref 36.0–46.0)
Hemoglobin: 9.2 g/dL — ABNORMAL LOW (ref 12.0–15.0)
MCH: 24.3 pg — AB (ref 26.0–34.0)
MCHC: 31.2 g/dL (ref 30.0–36.0)
MCV: 77.8 fL — AB (ref 78.0–100.0)
PLATELETS: 463 10*3/uL — AB (ref 150–400)
RBC: 3.79 MIL/uL — AB (ref 3.87–5.11)
RDW: 14.5 % (ref 11.5–15.5)
WBC: 18.8 10*3/uL — ABNORMAL HIGH (ref 4.0–10.5)

## 2017-10-10 NOTE — Progress Notes (Signed)
Post Partum Day 1 Subjective: no complaints, up ad lib, voiding and tolerating PO  Objective: Blood pressure 118/70, pulse 82, temperature 97.8 F (36.6 C), temperature source Oral, resp. rate 18, height $RemoveBe foreDEID_dyWPgouDeYuEIrwJYJmRnYARXrtITHiJ$5\' 7"l period 02/24/2017, SpO2 99 %, unknown if currently breastfeeding.  Physical Exam:  General: alert, cooperative and appears stated age Lochia: appropriate Uterine Fundus: firm Incision: N/A DVT Evaluation: No evidence of DVT seen on physical exam. Negative Homan's sign. No cords or calf tenderness. No significant calf/ankle edema.  Recent Labs    10/09/17 0505 10/10/17 0542  HGB 9.9* 9.2*  HCT 30.8* 29.5*    Assessment/Plan: Plan for discharge tomorrow Baby boy in NICU - doing well, off oxygen.    LOS: 1 day     Ranae Pilalise Jennifer Chevon Laufer 10/10/2017, 8:42 AM

## 2017-10-10 NOTE — Lactation Note (Signed)
This note was copied from a baby's chart. Lactation Consultation Note  Patient Name: Elizabeth Rolan Lipaiffany Moger RUEAV'WToday's Date: 10/10/2017 Reason for consult: Initial assessment;NICU baby;Preterm <34wks  Baby is 33 hours old  3132- 3/7 weeks, ( 4-5. 5oz )  Per mom DEBP was set up yesterday , pumped  Two times with drops.  LC review supply and demand and the importance of consistent pumping  At least 8 x's a day. Hand express before and after  Pumping.  LC enc mom to call insurance company for DEBP .  Mother informed of post-discharge support and given phone number to the lactation department, including services for phone call assistance; out-patient appointments; and breastfeeding support group. List of other breastfeeding resources in the community given in the handout. Encouraged mother to call for problems or concerns related to breastfeeding.    Maternal Data Has patient been taught Hand Expression?: Yes  Feeding Feeding Type: Donor Breast Milk Length of feed: 30 min  LATCH Score                   Interventions Interventions: Breast feeding basics reviewed  Lactation Tools Discussed/Used Tools: Pump Breast pump type: Double-Electric Breast Pump WIC Program: No Pump Review: Milk Storage   Consult Status Consult Status: Follow-up Date: 10/11/17 Follow-up type: In-patient    Elizabeth Burke 10/10/2017, 3:28 PM

## 2017-10-11 LAB — CULTURE, BETA STREP (GROUP B ONLY)

## 2017-10-11 MED ORDER — IBUPROFEN 600 MG PO TABS: 600.0000 mg | ORAL_TABLET | Freq: Four times a day (QID) | ORAL | 0 refills | Status: DC

## 2017-10-11 NOTE — Discharge Instructions (Signed)
Call MD for T>100.4, heavy vaginal bleeding, severe abdominal pain, or respiratory distress.  Call office to schedule postpartum visit in 6 weeks.  Pelvic rest x 6 weeks.   

## 2017-10-11 NOTE — Discharge Summary (Signed)
Obstetric Discharge Summary Reason for Admission: rupture of membranes Prenatal Procedures: none Intrapartum Procedures: spontaneous vaginal delivery Postpartum Procedures: none Complications-Operative and Postpartum: none Hemoglobin  Date Value Ref Range Status  10/10/2017 9.2 (L) 12.0 - 15.0 g/dL Final   HCT  Date Value Ref Range Status  10/10/2017 29.5 (L) 36.0 - 46.0 % Final    Physical Exam:  General: alert, cooperative and appears stated age 28Lochia: appropriate Uterine Fundus: firm Incision: n/a DVT Evaluation: No evidence of DVT seen on physical exam. Negative Homan's sign. No cords or calf tenderness.  Discharge Diagnoses: PPROM  Discharge Information: Date: 10/11/2017 Activity: pelvic rest Diet: routine Medications: PNV and Ibuprofen Condition: stable Instructions: refer to practice specific booklet Discharge to: home   Newborn Data: Live born female  Birth Weight: 4 lb 5.8 oz (1980 g) APGAR: 9, 8  Newborn Delivery   Birth date/time:  10/09/2017 06:20:00 Delivery type:  Vaginal, Spontaneous     Home with mother.  Yadira Hada 10/11/2017, 10:58 AM

## 2017-10-11 NOTE — Progress Notes (Signed)
Attempted visit with pt to introduce spiritual care services and offer support upon the birth of her baby who is in the NICU.  Pt was not available when I stopped by her room.  Will continue to follow.  Please page as further needs arise.  Maryanna ShapeAmanda M. Carley Hammedavee Lomax, M.Div. [facility] Chaplain Pager [phone] Office [phone]

## 2017-10-11 NOTE — Progress Notes (Signed)
Pt given discharge instructions, Baby and me book reviewed and Pre-e s/s instructed. F/u appoint. Pt verbalizes understanding.

## 2017-10-13 DIAGNOSIS — M064 Inflammatory polyarthropathy: Secondary | ICD-10-CM | POA: Diagnosis not present

## 2017-10-13 DIAGNOSIS — M4690 Unspecified inflammatory spondylopathy, site unspecified: Secondary | ICD-10-CM | POA: Diagnosis not present

## 2017-10-13 DIAGNOSIS — M461 Sacroiliitis, not elsewhere classified: Secondary | ICD-10-CM | POA: Diagnosis not present

## 2017-11-17 NOTE — H&P (Signed)
NAME: Elizabeth Burke, Elizabeth Burke MEDICAL RECORD ZO:[id number] ACCOUNT [id number] DATE OF BIRTH:09/29/1985 FACILITY: [facility] LOCATION: [facility] PHYSICIAN:Masey Scheiber Milana Obey, MD  HISTORY AND PHYSICAL  DATE OF ADMISSION:  12/08/2017  CHIEF COMPLAINT:  Tubal ligation.  HISTORY OF PRESENT ILLNESS:  A 32 year old G3, P2-0-1-2-2 who presents for permanent sterilization.  She is sure she would not want to be pregnant again under any circumstance.  The procedure including the specific risks regarding bleeding, infection,  adjacent organ injury, wound infection all discussed with her, the permanence of the procedure and failure rate of 2-3 per thousand discussed which she understands and accepts.  PAST MEDICAL HISTORY:    ALLERGIES:  None.  REVIEW OF SYSTEMS:  Significant for inflammatory arthritis, history of hidradenitis suppurativa.  She has had prior nasal surgery done also.  FAMILY HISTORY:  Significant for hepatitis, thyroid disease, arthritis, diabetes, and hypertension.  SOCIAL HISTORY:  Denies alcohol use.  She does smoke 2-3 cigarettes per day.  At one time, occasional cannabis use for relief of her arthritic pain.  PHYSICAL EXAMINATION: VITAL SIGNS:  Temperature 98.2, blood pressure 120/82. HEENT:  Unremarkable. NECK:  Supple, without masses. LUNGS:  Clear. HEART:  Regular rate and rhythm without murmurs, rubs or gallops noted. BREASTS:  Without masses. ABDOMEN:  Soft, nontender.  Vulva, vagina and cervix normal.  Uterus midposition, normal size.  Adnexa negative. NEUROLOGIC:  Unremarkable.  IMPRESSION:  Request permanent sterilization.  PLAN:  Tubal ligation by Filshie clip application.  Procedure and risks discussed as above.  AN/NUANCE  D:11/14/2017 T:11/14/2017 JOB:[id number]

## 2017-11-22 ENCOUNTER — Other Ambulatory Visit: Payer: Self-pay | Admitting: Surgery

## 2017-11-22 DIAGNOSIS — Z1389 Encounter for screening for other disorder: Secondary | ICD-10-CM | POA: Diagnosis not present

## 2017-11-22 DIAGNOSIS — L732 Hidradenitis suppurativa: Secondary | ICD-10-CM | POA: Diagnosis not present

## 2017-11-30 ENCOUNTER — Encounter (HOSPITAL_BASED_OUTPATIENT_CLINIC_OR_DEPARTMENT_OTHER): Payer: Self-pay | Admitting: *Deleted

## 2017-12-02 NOTE — Progress Notes (Signed)
Ensure pre surgery drink given with instructions to complete by 0745 dos, pt verbalized understanding. 

## 2017-12-04 NOTE — H&P (Signed)
Elizabeth Burke Documented: 11/22/2017 4:25 PM Location: [facility] Patient #: [id number] DOB: [birth year] Single / Language: [place] / Race: Black or African American Female   History of Present Illness (Alexio Sroka A. Magnus IvanBlackman MD; 11/22/2017 4:50 PM) The patient is a 32 year old female who presents with a complaint of Hidradenitis. This patient presents with hidradenitis. I did a wide excision of hidradenitis in her right axilla as well as her left inguinal area back in 2015. Most recently, she has had a significant flare in her left axilla. She was currently pregnant and is finally had her baby and is interested in further surgery. She has recently been on clindamycin and keflex with no improvement   Past Surgical History (Tanisha A. Manson PasseyBrown, RMA; 11/22/2017 4:25 PM) No pertinent past surgical history   Diagnostic Studies History (Tanisha A. Manson PasseyBrown, RMA; 11/22/2017 4:25 PM) Colonoscopy  never  Allergies (Tanisha A. Manson PasseyBrown, RMA; 11/22/2017 4:26 PM) No Known Drug Allergies [03/18/2014]: Allergies Reconciled   Medication History (Tanisha A. Manson PasseyBrown, RMA; 11/22/2017 4:26 PM) Ibuprofen (800MG  Tablet, Oral) Active. Medications Reconciled  Social History (Tanisha A. Manson PasseyBrown, RMA; 11/22/2017 4:25 PM) Alcohol use  Occasional alcohol use, Remotely quit alcohol use. Caffeine use  Carbonated beverages, Tea. Illicit drug use  Remotely quit drug use, Uses daily. Tobacco use  Current every day smoker, Former smoker.  Family History (Tanisha A. Manson PasseyBrown, RMA; 11/22/2017 4:25 PM) Cerebrovascular Accident  Family Members In General. Diabetes Mellitus  Family Members In General. Hypertension  Family Members In General. Prostate Cancer  Family Members In General. Thyroid problems  Family Members In General.  Pregnancy / Birth History (Tanisha A. Manson PasseyBrown, RMA; 11/22/2017 4:25 PM) Age at menarche  16 years. Age of menopause  <47 Contraceptive History  Contraceptive implant. Gravida   3 1 Maternal age  30-20 Para  2 1 Regular periods   Other Problems (Tanisha A. Manson PasseyBrown, RMA; 11/22/2017 4:25 PM) Arthritis  Back Pain  Gastroesophageal Reflux Disease  Other disease, cancer, significant illness     Review of Systems (Tanisha A. Brown RMA; 11/22/2017 4:25 PM) General Not Present- Appetite Loss, Chills, Fatigue, Fever, Night Sweats, Weight Gain and Weight Loss. Skin Present- New Lesions and Non-Healing Wounds. Not Present- Change in Wart/Mole, Dryness, Hives, Jaundice, Rash and Ulcer. HEENT Not Present- Earache, Hearing Loss, Hoarseness, Nose Bleed, Oral Ulcers, Ringing in the Ears, Seasonal Allergies, Sinus Pain, Sore Throat, Visual Disturbances, Wears glasses/contact lenses and Yellow Eyes. Respiratory Not Present- Bloody sputum, Chronic Cough, Difficulty Breathing, Snoring and Wheezing. Breast Not Present- Breast Mass, Breast Pain, Nipple Discharge and Skin Changes. Cardiovascular Not Present- Chest Pain, Difficulty Breathing Lying Down, Leg Cramps, Palpitations, Rapid Heart Rate, Shortness of Breath and Swelling of Extremities. Gastrointestinal Not Present- Abdominal Pain, Bloating, Bloody Stool, Change in Bowel Habits, Chronic diarrhea, Constipation, Difficulty Swallowing, Excessive gas, Gets full quickly at meals, Hemorrhoids, Indigestion, Nausea, Rectal Pain and Vomiting. Female Genitourinary Not Present- Frequency, Nocturia, Painful Urination, Pelvic Pain and Urgency. Musculoskeletal Present- Back Pain, Joint Pain, Joint Stiffness, Muscle Pain, Muscle Weakness and Swelling of Extremities. Neurological Not Present- Decreased Memory, Fainting, Headaches, Numbness, Seizures, Tingling, Tremor, Trouble walking and Weakness. Psychiatric Not Present- Anxiety, Bipolar, Change in Sleep Pattern, Depression, Fearful and Frequent crying. Endocrine Not Present- Cold Intolerance, Excessive Hunger, Hair Changes, Heat Intolerance, Hot flashes and New Diabetes. Hematology Not  Present- Blood Thinners, Easy Bruising, Excessive bleeding, Gland problems, HIV and Persistent Infections.  Vitals (Tanisha A. Brown RMA; 11/22/2017 4:26 PM) 11/22/2017 4:25 PM Weight: 189 lb  Height: 68in Body Surface Area: 2 m Body Mass Index: 28.74 kg/m  Temp.: 98.51F  Pulse: 74 (Regular)  BP: 122/84 (Sitting, Left Arm, Standard)       Physical Exam (Nakyah Erdmann A. Magnus Ivan MD; 11/22/2017 4:50 PM) The physical exam findings are as follows: Note:On physical examination, she has a wide open wound in the left axilla and multiple chronic draining sinus tract CONSISTENT with hidradenitis. There are also 2 nodules in her left inguinal area which I treated with silver nitrate Lungs clear CV RRR Abdomen soft, NT    Assessment & Plan (Candance Bohlman A. Magnus Ivan MD; 11/22/2017 4:51 PM) HIDRADENITIS (L73.2) Impression: This is severe hidradenitis in the left axilla. Wide excision is strongly recommended and she is in proceed with surgery as she did well with her previous surgery for hidradenitis. We discussed the risks of the surgery in detail. I will go ahead and start on antibiotics and give her pain medication preoperatively. Surgery was scheduled Current Plans Started Percocet 5-325 MG Oral Tablet, 1 (one) Tablet every six hours, as needed, #30, 11/22/2017, No Refill. Started Doxycycline Hyclate 100 MG Oral Capsule, 1 (one) Capsule two times daily, #30, 11/22/2017, No Refill.

## 2017-12-05 ENCOUNTER — Ambulatory Visit (HOSPITAL_BASED_OUTPATIENT_CLINIC_OR_DEPARTMENT_OTHER): Payer: 59 | Admitting: Certified Registered"

## 2017-12-05 ENCOUNTER — Other Ambulatory Visit: Payer: Self-pay

## 2017-12-05 ENCOUNTER — Ambulatory Visit (HOSPITAL_COMMUNITY)
Admission: RE | Admit: 2017-12-05 | Discharge: 2017-12-05 | Disposition: A | Discharge status: Home / Self Care | Payer: 59 | Source: Ambulatory Visit | Attending: Surgery | Admitting: Surgery

## 2017-12-05 ENCOUNTER — Encounter (HOSPITAL_COMMUNITY)
Admission: RE | Admit: 2017-12-05 | Discharge: 2017-12-05 | Disposition: A | Discharge status: Home / Self Care | Payer: 59 | Source: Ambulatory Visit | Attending: Obstetrics and Gynecology | Admitting: Obstetrics and Gynecology

## 2017-12-05 ENCOUNTER — Encounter (HOSPITAL_BASED_OUTPATIENT_CLINIC_OR_DEPARTMENT_OTHER): Payer: Self-pay | Admitting: Certified Registered"

## 2017-12-05 ENCOUNTER — Encounter (HOSPITAL_BASED_OUTPATIENT_CLINIC_OR_DEPARTMENT_OTHER)
Admission: RE | Disposition: A | Discharge status: Home / Self Care | Payer: Self-pay | Source: Ambulatory Visit | Attending: Surgery

## 2017-12-05 DIAGNOSIS — F17200 Nicotine dependence, unspecified, uncomplicated: Secondary | ICD-10-CM | POA: Diagnosis not present

## 2017-12-05 DIAGNOSIS — K219 Gastro-esophageal reflux disease without esophagitis: Secondary | ICD-10-CM | POA: Insufficient documentation

## 2017-12-05 DIAGNOSIS — Z791 Long term (current) use of non-steroidal anti-inflammatories (NSAID): Secondary | ICD-10-CM | POA: Diagnosis not present

## 2017-12-05 DIAGNOSIS — L732 Hidradenitis suppurativa: Secondary | ICD-10-CM | POA: Diagnosis not present

## 2017-12-05 DIAGNOSIS — M1990 Unspecified osteoarthritis, unspecified site: Secondary | ICD-10-CM | POA: Diagnosis not present

## 2017-12-05 DIAGNOSIS — I951 Orthostatic hypotension: Secondary | ICD-10-CM | POA: Diagnosis not present

## 2017-12-05 HISTORY — PX: HYDRADENITIS EXCISION: SHX5243

## 2017-12-05 LAB — POCT PREGNANCY, URINE: PREG TEST UR: NEGATIVE

## 2017-12-05 SURGERY — EXCISION, HIDRADENITIS, AXILLA
Anesthesia: General | Site: Axilla | Laterality: Left

## 2017-12-05 MED ORDER — CHLORHEXIDINE GLUCONATE CLOTH 2 % EX PADS: 6.0000 | MEDICATED_PAD | Freq: Once | CUTANEOUS | Status: DC

## 2017-12-05 MED ORDER — SCOPOLAMINE 1 MG/3DAYS TD PT72: 1.0000 | MEDICATED_PATCH | Freq: Once | TRANSDERMAL | Status: DC | PRN

## 2017-12-05 MED ORDER — FENTANYL CITRATE (PF) 100 MCG/2ML IJ SOLN
INTRAMUSCULAR | Status: AC
  Filled 2017-12-05: qty 2

## 2017-12-05 MED ORDER — CEFAZOLIN SODIUM-DEXTROSE 2-4 GM/100ML-% IV SOLN
INTRAVENOUS | Status: AC
  Filled 2017-12-05: qty 100

## 2017-12-05 MED ORDER — OXYCODONE HCL 5 MG PO TABS: 5.0000 mg | ORAL_TABLET | Freq: Once | ORAL | Status: DC | PRN

## 2017-12-05 MED ORDER — OXYCODONE HCL 5 MG/5ML PO SOLN: 5.0000 mg | Freq: Once | ORAL | Status: DC | PRN

## 2017-12-05 MED ORDER — GABAPENTIN 300 MG PO CAPS
ORAL_CAPSULE | ORAL | Status: AC
  Filled 2017-12-05: qty 1

## 2017-12-05 MED ORDER — GABAPENTIN 300 MG PO CAPS: 300.0000 mg | ORAL_CAPSULE | ORAL | Status: DC

## 2017-12-05 MED ORDER — MIDAZOLAM HCL 2 MG/2ML IJ SOLN: 1.0000 mg | INTRAMUSCULAR | Status: DC | PRN

## 2017-12-05 MED ORDER — BUPIVACAINE-EPINEPHRINE 0.5% -1:200000 IJ SOLN
INTRAMUSCULAR | Status: DC | PRN
  Administered 2017-12-05: 20 mL

## 2017-12-05 MED ORDER — LIDOCAINE HCL (CARDIAC) PF 100 MG/5ML IV SOSY
PREFILLED_SYRINGE | INTRAVENOUS | Status: DC | PRN
  Administered 2017-12-05: 60 mg via INTRAVENOUS

## 2017-12-05 MED ORDER — ONDANSETRON HCL 4 MG/2ML IJ SOLN
INTRAMUSCULAR | Status: DC | PRN
  Administered 2017-12-05: 4 mg via INTRAVENOUS

## 2017-12-05 MED ORDER — HYDROMORPHONE HCL 1 MG/ML IJ SOLN
0.2500 mg | INTRAMUSCULAR | Status: DC | PRN
  Administered 2017-12-05: 0.5 mg via INTRAVENOUS

## 2017-12-05 MED ORDER — CELECOXIB 200 MG PO CAPS
ORAL_CAPSULE | ORAL | Status: AC
Start: 2017-12-05 — End: ?
  Filled 2017-12-05: qty 1

## 2017-12-05 MED ORDER — PROPOFOL 10 MG/ML IV BOLUS
INTRAVENOUS | Status: DC | PRN
  Administered 2017-12-05: 200 mg via INTRAVENOUS

## 2017-12-05 MED ORDER — MEPERIDINE HCL 25 MG/ML IJ SOLN: 6.2500 mg | INTRAMUSCULAR | Status: DC | PRN

## 2017-12-05 MED ORDER — PROMETHAZINE HCL 25 MG/ML IJ SOLN: 6.2500 mg | INTRAMUSCULAR | Status: DC | PRN

## 2017-12-05 MED ORDER — CEFAZOLIN SODIUM-DEXTROSE 2-4 GM/100ML-% IV SOLN
2.0000 g | INTRAVENOUS | Status: AC
  Administered 2017-12-05: 2 g via INTRAVENOUS

## 2017-12-05 MED ORDER — ACETAMINOPHEN 500 MG PO TABS
ORAL_TABLET | ORAL | Status: AC
  Filled 2017-12-05: qty 2

## 2017-12-05 MED ORDER — LACTATED RINGERS IV SOLN
INTRAVENOUS | Status: DC
  Administered 2017-12-05 (×2): via INTRAVENOUS

## 2017-12-05 MED ORDER — HYDROMORPHONE HCL 1 MG/ML IJ SOLN
INTRAMUSCULAR | Status: AC
  Filled 2017-12-05: qty 0.5

## 2017-12-05 MED ORDER — DEXAMETHASONE SODIUM PHOSPHATE 4 MG/ML IJ SOLN
INTRAMUSCULAR | Status: DC | PRN
  Administered 2017-12-05: 6 mg via INTRAVENOUS

## 2017-12-05 MED ORDER — CELECOXIB 200 MG PO CAPS: 200.0000 mg | ORAL_CAPSULE | ORAL | Status: DC

## 2017-12-05 MED ORDER — ACETAMINOPHEN 500 MG PO TABS: 1000.0000 mg | ORAL_TABLET | ORAL | Status: DC

## 2017-12-05 MED ORDER — FENTANYL CITRATE (PF) 100 MCG/2ML IJ SOLN
50.0000 ug | INTRAMUSCULAR | Status: AC | PRN
  Administered 2017-12-05 (×4): 50 ug via INTRAVENOUS
  Administered 2017-12-05: 100 ug via INTRAVENOUS

## 2017-12-05 SURGICAL SUPPLY — 47 items
ADH SKN CLS APL DERMABOND .7 (GAUZE/BANDAGES/DRESSINGS)
BLADE CLIPPER SURG (BLADE) ×1 IMPLANT
BLADE HEX COATED 2.75 (ELECTRODE) ×2 IMPLANT
BLADE SURG 15 STRL LF DISP TIS (BLADE) ×1 IMPLANT
BLADE SURG 15 STRL SS (BLADE) ×2
CANISTER SUCT 1200ML W/VALVE (MISCELLANEOUS) ×2 IMPLANT
CHLORAPREP W/TINT 26ML (MISCELLANEOUS) ×2 IMPLANT
COVER BACK TABLE 60X90IN (DRAPES) ×2 IMPLANT
COVER MAYO STAND STRL (DRAPES) ×2 IMPLANT
DECANTER SPIKE VIAL GLASS SM (MISCELLANEOUS) IMPLANT
DERMABOND ADVANCED (GAUZE/BANDAGES/DRESSINGS)
DERMABOND ADVANCED .7 DNX12 (GAUZE/BANDAGES/DRESSINGS) ×1 IMPLANT
DRAPE LAPAROTOMY 100X72 PEDS (DRAPES) ×2 IMPLANT
DRAPE UTILITY XL STRL (DRAPES) ×2 IMPLANT
ELECT REM PT RETURN 9FT ADLT (ELECTROSURGICAL) ×2
ELECTRODE REM PT RTRN 9FT ADLT (ELECTROSURGICAL) ×1 IMPLANT
GLOVE BIO SURGEON STRL SZ 6.5 (GLOVE) ×1 IMPLANT
GLOVE SURG SIGNA 7.5 PF LTX (GLOVE) ×2 IMPLANT
GOWN STRL REUS W/ TWL LRG LVL3 (GOWN DISPOSABLE) ×1 IMPLANT
GOWN STRL REUS W/ TWL XL LVL3 (GOWN DISPOSABLE) ×1 IMPLANT
GOWN STRL REUS W/TWL LRG LVL3 (GOWN DISPOSABLE) ×2
GOWN STRL REUS W/TWL XL LVL3 (GOWN DISPOSABLE) ×2
MATRIX WOUND 3-LAYER 7X10 (Tissue) ×2 IMPLANT
MICROMATRIX 1000MG (Tissue) ×2 IMPLANT
MICROMATRIX 500MG (Tissue) ×2 IMPLANT
NDL HYPO 25X1 1.5 SAFETY (NEEDLE) ×1 IMPLANT
NEEDLE HYPO 25X1 1.5 SAFETY (NEEDLE) ×2 IMPLANT
NS IRRIG 1000ML POUR BTL (IV SOLUTION) ×2 IMPLANT
PACK BASIN DAY SURGERY FS (CUSTOM PROCEDURE TRAY) ×2 IMPLANT
PENCIL BUTTON HOLSTER BLD 10FT (ELECTRODE) ×2 IMPLANT
SLEEVE SCD COMPRESS KNEE MED (MISCELLANEOUS) ×1 IMPLANT
SOLUTION PARTIC MCRMTRX 1000MG (Tissue) IMPLANT
SOLUTION PARTIC MCRMTRX 500MG (Tissue) IMPLANT
SPONGE LAP 18X18 5 PK (GAUZE/BANDAGES/DRESSINGS) ×1 IMPLANT
SPONGE LAP 4X18 RFD (DISPOSABLE) ×2 IMPLANT
SUT ETHILON 2 0 FS 18 (SUTURE) ×2 IMPLANT
SUT ETHILON 3 0 FSL (SUTURE) ×2 IMPLANT
SUT VIC AB 2-0 CT1 27 (SUTURE) ×4
SUT VIC AB 2-0 CT1 TAPERPNT 27 (SUTURE) IMPLANT
SUT VIC AB 3-0 SH 27 (SUTURE) ×2
SUT VIC AB 3-0 SH 27X BRD (SUTURE) IMPLANT
SYR BULB 3OZ (MISCELLANEOUS) ×2 IMPLANT
SYR CONTROL 10ML LL (SYRINGE) ×2 IMPLANT
TOWEL GREEN STERILE FF (TOWEL DISPOSABLE) ×2 IMPLANT
TOWEL OR NON WOVEN STRL DISP B (DISPOSABLE) ×2 IMPLANT
TUBE CONNECTING 20X1/4 (TUBING) ×2 IMPLANT
YANKAUER SUCT BULB TIP NO VENT (SUCTIONS) ×2 IMPLANT

## 2017-12-05 NOTE — Anesthesia Preprocedure Evaluation (Signed)
Anesthesia Evaluation  Patient identified by MRN, date of birth, ID band Patient awake    Reviewed: Allergy & Precautions, H&P , NPO status , Patient's Chart, lab work & pertinent test results  Airway Mallampati: II  TM Distance: >3 FB Neck ROM: full    Dental no notable dental hx. (+) Dental Advisory Given, Teeth Intact   Pulmonary Current Smoker, former smoker,    Pulmonary exam normal breath sounds clear to auscultation       Cardiovascular negative cardio ROS Normal cardiovascular exam Rhythm:Regular Rate:Normal     Neuro/Psych  Headaches,    GI/Hepatic hiatal hernia, GERD  ,  Endo/Other    Renal/GU      Musculoskeletal  (+) Arthritis ,   Abdominal   Peds  Hematology   Anesthesia Other Findings   Reproductive/Obstetrics                             Anesthesia Physical  Anesthesia Plan  ASA: II  Anesthesia Plan: General   Post-op Pain Management:    Induction: Intravenous  PONV Risk Score and Plan: 3 and Ondansetron, Dexamethasone and Midazolam  Airway Management Planned: LMA and Oral ETT  Additional Equipment:   Intra-op Plan:   Post-operative Plan: Extubation in OR  Informed Consent: I have reviewed the patients History and Physical, chart, labs and discussed the procedure including the risks, benefits and alternatives for the proposed anesthesia with the patient or authorized representative who has indicated his/her understanding and acceptance.   Dental advisory given  Plan Discussed with: CRNA, Anesthesiologist and Surgeon  Anesthesia Plan Comments:         Anesthesia Quick Evaluation

## 2017-12-05 NOTE — Transfer of Care (Signed)
Immediate Anesthesia Transfer of Care Note  Patient: Elizabeth Burke  Procedure(s) Performed: WIDE EXCISION HIDRADENITIS LEFT AXILLA ERAS PATHWAY (Left Axilla)  Patient Location: PACU  Anesthesia Type:General  Level of Consciousness: awake and patient cooperative  Airway & Oxygen Therapy: Patient Spontanous Breathing and Patient connected to face mask oxygen  Post-op Assessment: Report given to RN and Post -op Vital signs reviewed and stable  Post vital signs: Reviewed and stable  Last Vitals:  Vitals Value Taken Time  BP    Temp    Pulse    Resp 12 12/05/2017  2:26 PM  SpO2    Vitals shown include unvalidated device data.  Last Pain:  Vitals:   12/05/17 1235  TempSrc: Oral  PainSc: 0-No pain      Patients Stated Pain Goal: 3 (12/05/17 1235)  Complications: No apparent anesthesia complications

## 2017-12-05 NOTE — Op Note (Signed)
WIDE EXCISION HIDRADENITIS LEFT AXILLA ERAS PATHWAY  Procedure Note  Elizabeth Burke 12/05/2017   Pre-op Diagnosis: Hidradenitis     Post-op Diagnosis: same  Procedure(s): WIDE EXCISION HIDRADENITIS LEFT AXILLA  PLACEMENT OF 7 CM X 10 CM SHEET OF 3 LAYERED A-CELL X 2. PLACEMENT 1,500 GM A-CELL POWDER  Surgeon(s): Abigail MiyamotoBlackman, Hava Massingale, MD  Anesthesia: General  Staff:  Circulator: Maryan RuedWeaver, Brandi C, RN Relief Circulator: Macie BurowsBowyer, Amy M, RN Scrub Person: Wardell HeathLewis, Dawn M, CST  Estimated Blood Loss: Minimal               Indications: This is a 32 year old female with severe left axillary hidradenitis.  She has multiple very large wounds in the axilla going toward the breast and toward the inner left arm.  Findings: Approximately 144 cm of skin and subcutaneous tissue was sharply excised with a scalpel.  Procedure: The patient was brought to the operating room and identified as the correct patient.  She was placed supine on the operating room table and general anesthesia was induced.  Her left axilla, chest, and breast were then prepped and draped in usual sterile fashion.  I performed a large elliptical incision working from the breast toward the axilla and then finally toward the left medial inner arm staying widely around all the large open wounds and granulation tissue.  I then completely excised the rest of the skin and granulation tissue with both the scalpel and with the cautery.  I excised 144 cm of skin and subcutaneous tissue.    Hemostasis appeared to be achieved.  We anesthetized the wound with Marcaine.  We irrigated with saline.  I then placed 1500 g of A-cell powder into the large wound.  I then placed 2 separate 7 x 10 cm 3 layer A-cell sheets into the wound and sutured them in place with vicryl sutures.  I was then able to close the large wound with 2.o Nylon interrupted sutures and 2.0 Vicryl sutures.  We then cover the incision with Xeroform, gauze, and ABDs.  Tape was then  applied.  The patient tolerated the procedure well.  All the counts were correct at the end the procedure.  The patient was then extubated in the operating room and taken in a stable condition to the recovery room.          [name]   Date: 12/05/2017  Time: 2:26 PM

## 2017-12-05 NOTE — Interval H&P Note (Signed)
History and Physical Interval Note:no change in H and P  12/05/2017 12:43 PM  Elizabeth Burke  has presented today for surgery, with the diagnosis of Hidradenitis  The various methods of treatment have been discussed with the patient and family. After consideration of risks, benefits and other options for treatment, the patient has consented to  Procedure(s): WIDE EXCISION HIDRADENITIS LEFT AXILLA ERAS PATHWAY (Left) as a surgical intervention .  The patient's history has been reviewed, patient examined, no change in status, stable for surgery.  I have reviewed the patient's chart and labs.  Questions were answered to the patient's satisfaction.     Kristoph Sattler A

## 2017-12-05 NOTE — Anesthesia Postprocedure Evaluation (Signed)
Anesthesia Post Note  Patient: Elizabeth Burke  Procedure(s) Performed: WIDE EXCISION HIDRADENITIS LEFT AXILLA ERAS PATHWAY (Left Axilla)     Patient location during evaluation: PACU Anesthesia Type: General Level of consciousness: awake and alert Pain management: pain level controlled Vital Signs Assessment: post-procedure vital signs reviewed and stable Respiratory status: spontaneous breathing, nonlabored ventilation and respiratory function stable Cardiovascular status: blood pressure returned to baseline and stable Postop Assessment: no apparent nausea or vomiting Anesthetic complications: no    Last Vitals:  Vitals:   12/05/17 1500 12/05/17 1515  BP: 124/73 126/87  Pulse: 76 65  Resp: (!) 25 14  Temp:    SpO2: 100% 100%    Last Pain:  Vitals:   12/05/17 1515  TempSrc:   PainSc: 5                  Lowella CurbWarren Ray Naithen [place]

## 2017-12-05 NOTE — Discharge Instructions (Signed)
Ok to shower starting tomorrow  Expect a lot of drainage from the wound.  It can be brown looking and can have a bad odor.  That's normal.  Ice pack, tylenol, ibuprofen also for pain  Go ahead and fill the script for Percocet   Post Anesthesia Home Care Instructions  Activity: Get plenty of rest for the remainder of the day. A responsible individual must stay with you for 24 hours following the procedure.  For the next 24 hours, DO NOT: -Drive a car -Advertising copywriterperate machinery -Drink alcoholic beverages -Take any medication unless instructed by your physician -Make any legal decisions or sign important papers.  Meals: Start with liquid foods such as gelatin or soup. Progress to regular foods as tolerated. Avoid greasy, spicy, heavy foods. If nausea and/or vomiting occur, drink only clear liquids until the nausea and/or vomiting subsides. Call your physician if vomiting continues.  Special Instructions/Symptoms: Your throat may feel dry or sore from the anesthesia or the breathing tube placed in your throat during surgery. If this causes discomfort, gargle with warm salt water. The discomfort should disappear within 24 hours.  If you had a scopolamine patch placed behind your ear for the management of post- operative nausea and/or vomiting:  1. The medication in the patch is effective for 72 hours, after which it should be removed.  Wrap patch in a tissue and discard in the trash. Wash hands thoroughly with soap and water. 2. You may remove the patch earlier than 72 hours if you experience unpleasant side effects which may include dry mouth, dizziness or visual disturbances. 3. Avoid touching the patch. Wash your hands with soap and water after contact with the patch.

## 2017-12-05 NOTE — Anesthesia Procedure Notes (Signed)
Procedure Name: LMA Insertion Date/Time: 12/05/2017 1:05 PM Performed by: Sheryn BisonBlocker, Arieh Bogue D, CRNA Pre-anesthesia Checklist: Patient identified, Emergency Drugs available, Suction available and Patient being monitored Patient Re-evaluated:Patient Re-evaluated prior to induction Oxygen Delivery Method: Circle system utilized Preoxygenation: Pre-oxygenation with 100% oxygen Induction Type: IV induction Ventilation: Mask ventilation without difficulty LMA: LMA inserted LMA Size: 4.0 Number of attempts: 1 Airway Equipment and Method: Bite block Placement Confirmation: positive ETCO2 Tube secured with: Tape Dental Injury: Teeth and Oropharynx as per pre-operative assessment

## 2017-12-05 NOTE — Interval H&P Note (Signed)
History and Physical Interval Note:no change in H and P  12/05/2017 12:51 PM  Elizabeth Burke  has presented today for surgery, with the diagnosis of Hidradenitis  The various methods of treatment have been discussed with the patient and family. After consideration of risks, benefits and other options for treatment, the patient has consented to  Procedure(s): WIDE EXCISION HIDRADENITIS LEFT AXILLA ERAS PATHWAY (Left) as a surgical intervention .  The patient's history has been reviewed, patient examined, no change in status, stable for surgery.  I have reviewed the patient's chart and labs.  Questions were answered to the patient's satisfaction.     Sulaiman Imbert A

## 2017-12-06 ENCOUNTER — Encounter (HOSPITAL_BASED_OUTPATIENT_CLINIC_OR_DEPARTMENT_OTHER): Payer: Self-pay | Admitting: Surgery

## 2017-12-08 ENCOUNTER — Encounter (HOSPITAL_COMMUNITY)
Admission: RE | Disposition: A | Discharge status: Home / Self Care | Payer: Self-pay | Source: Ambulatory Visit | Attending: Obstetrics and Gynecology

## 2017-12-08 ENCOUNTER — Other Ambulatory Visit: Payer: Self-pay

## 2017-12-08 ENCOUNTER — Encounter (HOSPITAL_COMMUNITY): Payer: Self-pay

## 2017-12-08 ENCOUNTER — Ambulatory Visit (HOSPITAL_COMMUNITY): Payer: 59 | Admitting: Anesthesiology

## 2017-12-08 ENCOUNTER — Ambulatory Visit (HOSPITAL_COMMUNITY)
Admission: RE | Admit: 2017-12-08 | Discharge: 2017-12-08 | Disposition: A | Discharge status: Home / Self Care | Payer: 59 | Source: Ambulatory Visit | Attending: Obstetrics and Gynecology | Admitting: Obstetrics and Gynecology

## 2017-12-08 DIAGNOSIS — M1990 Unspecified osteoarthritis, unspecified site: Secondary | ICD-10-CM | POA: Diagnosis not present

## 2017-12-08 DIAGNOSIS — Z302 Encounter for sterilization: Secondary | ICD-10-CM | POA: Diagnosis not present

## 2017-12-08 DIAGNOSIS — F17210 Nicotine dependence, cigarettes, uncomplicated: Secondary | ICD-10-CM | POA: Insufficient documentation

## 2017-12-08 DIAGNOSIS — L732 Hidradenitis suppurativa: Secondary | ICD-10-CM

## 2017-12-08 HISTORY — PX: LAPAROSCOPIC TUBAL LIGATION: SHX1937

## 2017-12-08 LAB — CBC
HCT: 27.2 % — ABNORMAL LOW (ref 36.0–46.0)
Hemoglobin: 8.2 g/dL — ABNORMAL LOW (ref 12.0–15.0)
MCH: 21.8 pg — AB (ref 26.0–34.0)
MCHC: 30.1 g/dL (ref 30.0–36.0)
MCV: 72.1 fL — ABNORMAL LOW (ref 78.0–100.0)
PLATELETS: 575 10*3/uL — AB (ref 150–400)
RBC: 3.77 MIL/uL — ABNORMAL LOW (ref 3.87–5.11)
RDW: 16.3 % — AB (ref 11.5–15.5)
WBC: 13 10*3/uL — ABNORMAL HIGH (ref 4.0–10.5)

## 2017-12-08 LAB — TYPE AND SCREEN
ABO/RH(D): B POS
ANTIBODY SCREEN: NEGATIVE

## 2017-12-08 LAB — PREGNANCY, URINE: Preg Test, Ur: NEGATIVE

## 2017-12-08 SURGERY — LIGATION, FALLOPIAN TUBE, LAPAROSCOPIC
Anesthesia: General | Laterality: Bilateral

## 2017-12-08 MED ORDER — LIDOCAINE HCL (CARDIAC) PF 100 MG/5ML IV SOSY
PREFILLED_SYRINGE | INTRAVENOUS | Status: AC
  Filled 2017-12-08: qty 5

## 2017-12-08 MED ORDER — SODIUM CHLORIDE 0.9 % IJ SOLN
INTRAMUSCULAR | Status: AC
  Filled 2017-12-08: qty 20

## 2017-12-08 MED ORDER — SCOPOLAMINE 1 MG/3DAYS TD PT72
MEDICATED_PATCH | TRANSDERMAL | Status: AC
  Administered 2017-12-08: 1.5 mg via TRANSDERMAL
  Filled 2017-12-08: qty 1

## 2017-12-08 MED ORDER — FENTANYL CITRATE (PF) 100 MCG/2ML IJ SOLN
25.0000 ug | INTRAMUSCULAR | Status: DC | PRN
  Administered 2017-12-08 (×2): 50 ug via INTRAVENOUS

## 2017-12-08 MED ORDER — DEXAMETHASONE SODIUM PHOSPHATE 4 MG/ML IJ SOLN
INTRAMUSCULAR | Status: AC
  Filled 2017-12-08: qty 1

## 2017-12-08 MED ORDER — LIDOCAINE HCL (CARDIAC) PF 100 MG/5ML IV SOSY
PREFILLED_SYRINGE | INTRAVENOUS | Status: DC | PRN
  Administered 2017-12-08: 60 mg via INTRAVENOUS

## 2017-12-08 MED ORDER — FENTANYL CITRATE (PF) 250 MCG/5ML IJ SOLN
INTRAMUSCULAR | Status: AC
  Filled 2017-12-08: qty 5

## 2017-12-08 MED ORDER — ROCURONIUM BROMIDE 100 MG/10ML IV SOLN
INTRAVENOUS | Status: DC | PRN
  Administered 2017-12-08: 40 mg via INTRAVENOUS

## 2017-12-08 MED ORDER — ONDANSETRON HCL 4 MG/2ML IJ SOLN
INTRAMUSCULAR | Status: DC | PRN
  Administered 2017-12-08: 4 mg via INTRAVENOUS

## 2017-12-08 MED ORDER — BUPIVACAINE HCL (PF) 0.25 % IJ SOLN
INTRAMUSCULAR | Status: AC
  Filled 2017-12-08: qty 30

## 2017-12-08 MED ORDER — MIDAZOLAM HCL 2 MG/2ML IJ SOLN
INTRAMUSCULAR | Status: DC | PRN
  Administered 2017-12-08: 2 mg via INTRAVENOUS

## 2017-12-08 MED ORDER — ROCURONIUM BROMIDE 100 MG/10ML IV SOLN
INTRAVENOUS | Status: AC
  Filled 2017-12-08: qty 1

## 2017-12-08 MED ORDER — OXYCODONE-ACETAMINOPHEN 7.5-325 MG PO TABS: 1.0000 | ORAL_TABLET | Freq: Four times a day (QID) | ORAL | 0 refills | Status: DC | PRN

## 2017-12-08 MED ORDER — LACTATED RINGERS IV SOLN
INTRAVENOUS | Status: DC
  Administered 2017-12-08: 125 mL/h via INTRAVENOUS
  Administered 2017-12-08: 08:00:00 via INTRAVENOUS

## 2017-12-08 MED ORDER — MIDAZOLAM HCL 2 MG/2ML IJ SOLN
INTRAMUSCULAR | Status: AC
Start: 2017-12-08 — End: ?
  Filled 2017-12-08: qty 2

## 2017-12-08 MED ORDER — SUGAMMADEX SODIUM 200 MG/2ML IV SOLN
INTRAVENOUS | Status: DC | PRN
  Administered 2017-12-08: 173.8 mg via INTRAVENOUS

## 2017-12-08 MED ORDER — DEXAMETHASONE SODIUM PHOSPHATE 10 MG/ML IJ SOLN
INTRAMUSCULAR | Status: AC
  Filled 2017-12-08: qty 1

## 2017-12-08 MED ORDER — KETOROLAC TROMETHAMINE 30 MG/ML IJ SOLN
INTRAMUSCULAR | Status: AC
  Filled 2017-12-08: qty 1

## 2017-12-08 MED ORDER — KETOROLAC TROMETHAMINE 30 MG/ML IJ SOLN
INTRAMUSCULAR | Status: DC | PRN
  Administered 2017-12-08: 30 mg via INTRAVENOUS

## 2017-12-08 MED ORDER — ONDANSETRON HCL 4 MG/2ML IJ SOLN
INTRAMUSCULAR | Status: AC
Start: 2017-12-08 — End: ?
  Filled 2017-12-08: qty 2

## 2017-12-08 MED ORDER — PROPOFOL 10 MG/ML IV BOLUS
INTRAVENOUS | Status: AC
  Filled 2017-12-08: qty 20

## 2017-12-08 MED ORDER — PROMETHAZINE HCL 25 MG/ML IJ SOLN: 6.2500 mg | INTRAMUSCULAR | Status: DC | PRN

## 2017-12-08 MED ORDER — DEXAMETHASONE SODIUM PHOSPHATE 10 MG/ML IJ SOLN
INTRAMUSCULAR | Status: DC | PRN
  Administered 2017-12-08: 10 mg via INTRAVENOUS

## 2017-12-08 MED ORDER — FENTANYL CITRATE (PF) 100 MCG/2ML IJ SOLN
INTRAMUSCULAR | Status: AC
  Administered 2017-12-08: 50 ug via INTRAVENOUS
  Filled 2017-12-08: qty 2

## 2017-12-08 MED ORDER — BUPIVACAINE HCL (PF) 0.25 % IJ SOLN
INTRAMUSCULAR | Status: DC | PRN
  Administered 2017-12-08: 8 mL
  Administered 2017-12-08: 10 mL

## 2017-12-08 MED ORDER — SCOPOLAMINE 1 MG/3DAYS TD PT72
1.0000 | MEDICATED_PATCH | Freq: Once | TRANSDERMAL | Status: DC
  Administered 2017-12-08: 1.5 mg via TRANSDERMAL

## 2017-12-08 MED ORDER — SUGAMMADEX SODIUM 200 MG/2ML IV SOLN
INTRAVENOUS | Status: AC
  Filled 2017-12-08: qty 2

## 2017-12-08 MED ORDER — SODIUM CHLORIDE 0.9 % IJ SOLN
INTRAMUSCULAR | Status: DC | PRN
  Administered 2017-12-08: 10 mL

## 2017-12-08 MED ORDER — PROPOFOL 10 MG/ML IV BOLUS
INTRAVENOUS | Status: DC | PRN
  Administered 2017-12-08: 200 mg via INTRAVENOUS

## 2017-12-08 MED ORDER — FENTANYL CITRATE (PF) 100 MCG/2ML IJ SOLN
INTRAMUSCULAR | Status: DC | PRN
  Administered 2017-12-08: 100 ug via INTRAVENOUS
  Administered 2017-12-08: 50 ug via INTRAVENOUS
  Administered 2017-12-08: 100 ug via INTRAVENOUS

## 2017-12-08 SURGICAL SUPPLY — 24 items
ADH SKN CLS APL DERMABOND .7 (GAUZE/BANDAGES/DRESSINGS) ×1
CABLE HIGH FREQUENCY MONO STRZ (ELECTRODE) IMPLANT
CATH ROBINSON RED A/P 16FR (CATHETERS) ×2 IMPLANT
CLIP FILSHIE TUBAL LIGA STRL (Clip) ×3 IMPLANT
DERMABOND ADVANCED (GAUZE/BANDAGES/DRESSINGS) ×1
DERMABOND ADVANCED .7 DNX12 (GAUZE/BANDAGES/DRESSINGS) ×1 IMPLANT
DRSG OPSITE POSTOP 3X4 (GAUZE/BANDAGES/DRESSINGS) ×1 IMPLANT
GLOVE BIO SURGEON STRL SZ7 (GLOVE) ×2 IMPLANT
GLOVE BIOGEL PI IND STRL 7.0 (GLOVE) ×1 IMPLANT
GLOVE BIOGEL PI INDICATOR 7.0 (GLOVE) ×1
GOWN STRL REUS W/TWL LRG LVL3 (GOWN DISPOSABLE) ×4 IMPLANT
NEEDLE INSUFFLATION 120MM (ENDOMECHANICALS) ×2 IMPLANT
PACK LAPAROSCOPY BASIN (CUSTOM PROCEDURE TRAY) ×2 IMPLANT
PACK TRENDGUARD 450 HYBRID PRO (MISCELLANEOUS) IMPLANT
PROTECTOR NERVE ULNAR (MISCELLANEOUS) ×4 IMPLANT
STRIP CLOSURE SKIN 1/4X3 (GAUZE/BANDAGES/DRESSINGS) ×2 IMPLANT
SUT VIC AB 2-0 UR6 27 (SUTURE) IMPLANT
SUT VICRYL RAPIDE 4/0 PS 2 (SUTURE) ×2 IMPLANT
SYR 20CC LL (SYRINGE) ×2 IMPLANT
TOWEL OR 17X24 6PK STRL BLUE (TOWEL DISPOSABLE) ×4 IMPLANT
TRENDGUARD 450 HYBRID PRO PACK (MISCELLANEOUS) ×2
TROCAR XCEL DIL TIP R 11M (ENDOMECHANICALS) ×2 IMPLANT
TROCAR Z-THREAD BLADED 5X100MM (TROCAR) IMPLANT
WARMER LAPAROSCOPE (MISCELLANEOUS) ×2 IMPLANT

## 2017-12-08 NOTE — Op Note (Signed)
1 preoperative diagnosis: Request permanent sterilization  Postoperative diagnosis: Same  Procedure: Laparoscopy, bilateral tubal ligation by Filshie clip application.  Surgeon: Marcelle OverlieHolland  Anesthesia: General  EBL: 5 cc  Procedure and findings:  Patient was taken to the operating room after an adequate level of general anesthesia was obtained with the patient's legs in stirrups the perineum and vagina were prepped separately, abdomen was prepped after appropriate timeouts were taken.  The bladder was drained EUA carried out, uterus mid position, mobile, adnexa negative.  Hulka tenaculum was positioned.  Attention directed to the abdomen where the subumbilical area was infiltrated with quarter percent Marcaine plain a small incision was made and the varies needle was introduced without difficulty.  Its intra-abdominal position was verified by pressure water testing.  After 2 L pneumoperitoneum was then created, lap scopic trocar and sleeve were then introduced without difficulty.  There was no evidence of any bleeding or trauma.  Survey of the pelvis revealed the bilateral tubes ovaries cul-de-sac and the uterus were otherwise unremarkable.  Quarter percent Marcaine was then draped across each tube from the cornu to the fimbriated end, Filshie clip was backloaded first clip applied on the right side 2 cm from the cornu and a right ankle completely engulfing the tube with excellent application.  The exact same repeated on the opposite side again after carefully a bit identified in the full extent of both tubes.  Photos were taken.  Instruments were removed, gas allowed to escape 4-0 Vicryl subcuticular skin closure with a dressing applied.  She tolerated this well went to recovery room in good condition.  Dictated with Dragon Medical 1  Meriel Picaichard M Jamas Jaquay MD

## 2017-12-08 NOTE — Progress Notes (Signed)
The patient was re-examined with no change in status 

## 2017-12-08 NOTE — Anesthesia Procedure Notes (Signed)
Procedure Name: Intubation Date/Time: 12/08/2017 7:24 AM Performed by: Hewitt Blade, CRNA Pre-anesthesia Checklist: Patient identified, Emergency Drugs available, Suction available and Patient being monitored Patient Re-evaluated:Patient Re-evaluated prior to induction Oxygen Delivery Method: Circle system utilized Preoxygenation: Pre-oxygenation with 100% oxygen Induction Type: IV induction Ventilation: Mask ventilation without difficulty Laryngoscope Size: Mac and 3 Grade View: Grade I Tube type: Oral Tube size: 7.0 mm Number of attempts: 1 Airway Equipment and Method: Stylet Placement Confirmation: ETT inserted through vocal cords under direct vision,  positive ETCO2 and breath sounds checked- equal and bilateral Secured at: 21 cm Tube secured with: Tape Dental Injury: Teeth and Oropharynx as per pre-operative assessment

## 2017-12-08 NOTE — Anesthesia Preprocedure Evaluation (Addendum)
Anesthesia Evaluation  Patient identified by MRN, date of birth, ID band Patient awake    Reviewed: Allergy & Precautions, NPO status , Patient's Chart, lab work & pertinent test results  Airway Mallampati: II  TM Distance: >3 FB Neck ROM: Full    Dental  (+) Dental Advisory Given   Pulmonary former smoker,    breath sounds clear to auscultation       Cardiovascular negative cardio ROS   Rhythm:Regular Rate:Normal     Neuro/Psych  Headaches,    GI/Hepatic Neg liver ROS, GERD  ,  Endo/Other  negative endocrine ROS  Renal/GU negative Renal ROS     Musculoskeletal   Abdominal   Peds  Hematology negative hematology ROS (+)   Anesthesia Other Findings   Reproductive/Obstetrics                            Lab Results  Component Value Date   WBC 13.0 (H) 12/08/2017   HGB 8.2 (L) 12/08/2017   HCT 27.2 (L) 12/08/2017   MCV 72.1 (L) 12/08/2017   PLT 575 (H) 12/08/2017   Lab Results  Component Value Date   CREATININE 0.77 06/22/2015   BUN 11 06/22/2015   NA 135 06/22/2015   K 4.2 06/22/2015   CL 102 06/22/2015   CO2 26 06/22/2015    Anesthesia Physical Anesthesia Plan  ASA: II  Anesthesia Plan: General   Post-op Pain Management:    Induction: Intravenous  PONV Risk Score and Plan: 3 and Midazolam, Dexamethasone, Ondansetron and Treatment may vary due to age or medical condition  Airway Management Planned: Oral ETT  Additional Equipment:   Intra-op Plan:   Post-operative Plan: Extubation in OR  Informed Consent: I have reviewed the patients History and Physical, chart, labs and discussed the procedure including the risks, benefits and alternatives for the proposed anesthesia with the patient or authorized representative who has indicated his/her understanding and acceptance.     Plan Discussed with:   Anesthesia Plan Comments:        Anesthesia Quick Evaluation

## 2017-12-08 NOTE — Discharge Instructions (Signed)

## 2017-12-08 NOTE — Transfer of Care (Signed)
Immediate Anesthesia Transfer of Care Note  Patient: Elizabeth Burke  Procedure(s) Performed: LAPAROSCOPIC TUBAL LIGATION with Filshie clips (Bilateral )  Patient Location: PACU  Anesthesia Type:General  Level of Consciousness: awake, alert  and oriented  Airway & Oxygen Therapy: Patient Spontanous Breathing and Patient connected to nasal cannula oxygen  Post-op Assessment: Report given to RN, Post -op Vital signs reviewed and stable and Patient moving all extremities  Post vital signs: Reviewed and stable  Last Vitals:  Vitals Value Taken Time  BP 139/87 12/08/2017  8:10 AM  Temp    Pulse 93 12/08/2017  8:11 AM  Resp 22 12/08/2017  8:11 AM  SpO2 100 % 12/08/2017  8:11 AM  Vitals shown include unvalidated device data.  Last Pain:  Vitals:   12/08/17 0640  TempSrc: Oral  PainSc: 4       Patients Stated Pain Goal: 4 (12/08/17 0640)  Complications: No apparent anesthesia complications

## 2017-12-08 NOTE — Anesthesia Postprocedure Evaluation (Signed)
Anesthesia Post Note  Patient: Elizabeth Burke  Procedure(s) Performed: LAPAROSCOPIC TUBAL LIGATION with Filshie clips (Bilateral )     Patient location during evaluation: PACU Anesthesia Type: General Level of consciousness: awake and alert Pain management: pain level controlled Vital Signs Assessment: post-procedure vital signs reviewed and stable Respiratory status: spontaneous breathing, nonlabored ventilation, respiratory function stable and patient connected to nasal cannula oxygen Cardiovascular status: blood pressure returned to baseline and stable Postop Assessment: no apparent nausea or vomiting Anesthetic complications: no    Last Vitals:  Vitals:   12/08/17 0926 12/08/17 1005  BP: 133/77 (!) 151/81  Pulse: 87 75  Resp: 18 18  Temp:  36.8 C  SpO2: 99% 98%    Last Pain:  Vitals:   12/08/17 1005  TempSrc:   PainSc: 2    Pain Goal: Patients Stated Pain Goal: 4 (12/08/17 1005)               Kennieth RadFitzgerald, Gid Schoffstall E

## 2017-12-09 ENCOUNTER — Encounter (HOSPITAL_COMMUNITY): Payer: Self-pay | Admitting: Obstetrics and Gynecology

## 2017-12-12 ENCOUNTER — Telehealth: Payer: Self-pay | Admitting: Surgery

## 2017-12-12 NOTE — Telephone Encounter (Signed)
Ms. Elizabeth Burke had left axillary hidradenitis excised by Dr. Magnus IvanBlackman on 12/05/2017.   She has drainage from the wound and the wound has "come apart".  She will keep the wound clean and contact our office in the AM for a wound check.  Ovidio Kinavid [name], MD, [facility] Pager: [phone] Office phone:  [phone]

## 2017-12-20 ENCOUNTER — Other Ambulatory Visit (HOSPITAL_COMMUNITY): Payer: Self-pay | Admitting: *Deleted

## 2017-12-21 ENCOUNTER — Ambulatory Visit (HOSPITAL_COMMUNITY)
Admission: RE | Admit: 2017-12-21 | Discharge: 2017-12-21 | Disposition: A | Discharge status: Home / Self Care | Payer: 59 | Source: Ambulatory Visit | Attending: Obstetrics and Gynecology | Admitting: Obstetrics and Gynecology

## 2017-12-21 ENCOUNTER — Ambulatory Visit: Payer: 59 | Admitting: Cardiovascular Disease

## 2017-12-21 DIAGNOSIS — D509 Iron deficiency anemia, unspecified: Secondary | ICD-10-CM | POA: Diagnosis not present

## 2017-12-21 MED ORDER — SODIUM CHLORIDE 0.9 % IV SOLN
510.0000 mg | INTRAVENOUS | Status: DC
Start: 2017-12-21 — End: 2017-12-22
  Administered 2017-12-21: 510 mg via INTRAVENOUS
  Filled 2017-12-21: qty 17

## 2017-12-21 NOTE — Discharge Instructions (Signed)

## 2017-12-22 ENCOUNTER — Ambulatory Visit (INDEPENDENT_AMBULATORY_CARE_PROVIDER_SITE_OTHER): Payer: 59 | Admitting: Physician Assistant

## 2017-12-22 ENCOUNTER — Encounter: Payer: Self-pay | Admitting: Physician Assistant

## 2017-12-22 VITALS — BP 137/84 | HR 93 | Ht 67.0 in | Wt 198.2 lb

## 2017-12-22 DIAGNOSIS — D638 Anemia in other chronic diseases classified elsewhere: Secondary | ICD-10-CM | POA: Diagnosis not present

## 2017-12-22 DIAGNOSIS — I951 Orthostatic hypotension: Secondary | ICD-10-CM | POA: Diagnosis not present

## 2017-12-22 DIAGNOSIS — M064 Inflammatory polyarthropathy: Secondary | ICD-10-CM | POA: Diagnosis not present

## 2017-12-22 DIAGNOSIS — I959 Hypotension, unspecified: Secondary | ICD-10-CM | POA: Diagnosis not present

## 2017-12-22 DIAGNOSIS — M461 Sacroiliitis, not elsewhere classified: Secondary | ICD-10-CM | POA: Diagnosis not present

## 2017-12-22 DIAGNOSIS — Z7689 Persons encountering health services in other specified circumstances: Secondary | ICD-10-CM | POA: Diagnosis not present

## 2017-12-22 DIAGNOSIS — R55 Syncope and collapse: Secondary | ICD-10-CM | POA: Diagnosis not present

## 2017-12-22 DIAGNOSIS — M4690 Unspecified inflammatory spondylopathy, site unspecified: Secondary | ICD-10-CM | POA: Diagnosis not present

## 2017-12-22 NOTE — Progress Notes (Signed)
Cardiology Office Note   Date:  12/22/2017   ID:  Elizabeth Donovaniffany J Julio, DOB July 28, 1985, MRN [id number]  PCP:  Gaspar Garbeisovec, Richard W, MD  Cardiologist: Dr. Duke Salviaandolph, 09/20/2017 Theodore Demarkhonda Marqui Formby, PA-C     History of Present Illness: Elizabeth Burke is a 32 y.o. female with a history of reactive arthritis with spondyloarthropathy, prior tob abuse, separative hidradenitis s/p surgery, recent BTL, GERD, HH, orthostatic hypotension, syncope  3/19 office visit, patient was [redacted] weeks pregnant, had tachycardia and then syncope felt orthostatic with a possible component of POTS.  2 L of fluid, compression socks in liberalize salt intake recommended, echo ordered and was normal, symptoms felt secondary to her pregnancy  Elizabeth Donovaniffany J Hochstetler presents for cardiology follow up.  She is doing well after her BTL, no complications.   She has delivered her baby, doing well.  She has had no more near-syncope. She is doing well with salt. She has not been feeling the near-syncope since delivery.   Admits she does not drink enough water, but is not having any symptoms.  Does not get lightheaded or dizzy when she stands up.  No constipation. Is breast-feeding and that is going well.   Never gets chest pain or palpitations.    Past Medical History:  Diagnosis Date  . Allergy    Seasonal  . Arthritis    Spondyloarthropathy--followed by Dr Dierdre ForthBeekman  . GERD (gastroesophageal reflux disease) 2000 or so  . H/O hiatal hernia   . Headache(784.0)   . Neuromuscular disorder (HCC)    Pins and needles in right arm following surgery for hidradenitis.  . Orthostatic hypotension 09/20/2017  . Scoliosis [id number]  . Syncope 09/20/2017  . Urinary tract infection     Past Surgical History:  Procedure Laterality Date  . FRACTURE SURGERY Left 2000   Left wrist--casted only, no surgery  . HYDRADENITIS EXCISION Bilateral 07/31/2013   Procedure: EXCISION HYDRADENITIS RIGHT AXILLA  AND LEFT GROIN ;  Surgeon: Shelly Rubensteinouglas A  Blackman, MD;  Location: [facility] OR;  Service: General;  Laterality: Bilateral;  . HYDRADENITIS EXCISION Left 12/05/2017   Procedure: WIDE EXCISION HIDRADENITIS LEFT AXILLA ERAS PATHWAY;  Surgeon: Abigail MiyamotoBlackman, Douglas, MD;  Location: [facility];  Service: General;  Laterality: Left;  . LAPAROSCOPIC TUBAL LIGATION Bilateral 12/08/2017   Procedure: LAPAROSCOPIC TUBAL LIGATION with Filshie clips;  Surgeon: Richarda OverlieHolland, Richard, MD;  Location: [facility];  Service: Gynecology;  Laterality: Bilateral;  . NOSE SURGERY N/A 2012   removal of fish hook     Current Outpatient Medications  Medication Sig Dispense Refill  . ibuprofen (ADVIL,MOTRIN) 800 MG tablet Take 800 mg by mouth every 8 (eight) hours as needed. FOR PAIN  2  . Prenatal Vit-Fe Fumarate-FA (PRENATAL MULTIVITAMIN) TABS tablet Take 1 tablet by mouth daily.      No current facility-administered medications for this visit.     Allergies:   Patient has no known allergies.    Social History:  The patient  reports that she has quit smoking. She started smoking about 13 years ago. She smoked 0.00 packs per day for 0.00 years. She has never used smokeless tobacco. She reports that she has current or past drug history. Drug: Marijuana. She reports that she does not drink alcohol.   Family History:  The patient's family history includes Arthritis in her paternal grandfather; Cancer in her maternal grandfather and maternal grandmother; Diabetes in her maternal grandfather and paternal grandmother; Hypertension in her mother; Ovarian cysts in her sister; Sleep  apnea in her father.    ROS:  Please see the history of present illness. All other systems are reviewed and negative.    PHYSICAL EXAM: VS:  BP 137/84   Pulse 93   Ht 5\' 7"  (1.702 m)   Wt 198 lb 3.2 oz (89.9 kg)   LMP 02/24/2017   BMI 31.04 kg/m  , BMI Body mass index is 31.04 kg/m. GEN: Well nourished, well developed, female in no acute distress  HEENT: normal for age  Neck: no JVD,  no carotid bruit, no masses Cardiac: RRR; no murmur, no rubs, or gallops Respiratory:  clear to auscultation bilaterally, normal work of breathing GI: soft, nontender, nondistended, + BS MS: no deformity or atrophy; no edema; distal pulses are 2+ in all 4 extremities   Skin: warm and dry, no rash Neuro:  Strength and sensation are intact Psych: euthymic mood, full affect   EKG:  EKG is not ordered today.   ECHO: 10/05/2017 - Left ventricle: Mid and basal inferior wall hypokinesis. The   cavity size was mildly dilated. Systolic function was normal. The   estimated ejection fraction was in the range of 50% to 55%. Wall   motion was normal; there were no regional wall motion   abnormalities. Left ventricular diastolic function parameters   were normal. - Atrial septum: No defect or patent foramen ovale was identified.  Recent Labs: 12/08/2017: Hemoglobin 8.2; Platelets 575    Lipid Panel No results found for: CHOL, TRIG, HDL, CHOLHDL, VLDL, LDLCALC, LDLDIRECT   Wt Readings from Last 3 Encounters:  12/22/17 198 lb 3.2 oz (89.9 kg)  12/21/17 193 lb (87.5 kg)  12/05/17 191 lb 8 oz (86.9 kg)     Other studies Reviewed: Additional studies/ records that were reviewed today include: Office notes, hospital records and testing.  ASSESSMENT AND PLAN:  1.  Syncope: Her symptoms resolved with delivery of her baby.  Her echo was normal.  No further work-up is indicated.  2.  Orthostatic hypotension: She is encouraged to keep herself hydrated but is currently asymptomatic.   Current medicines are reviewed at length with the patient today.  The patient does not have concerns regarding medicines.  The following changes have been made:  no change  Labs/ tests ordered today include:  No orders of the defined types were placed in this encounter.    Disposition:   FU with Dr. Duke Salvia as needed  Signed, Theodore Demark, PA-C  12/22/2017 5:48 PM    [facility] Phone: [phone]; Fax: [phone]  This note was written with the assistance of speech recognition software. Please excuse any transcriptional errors.

## 2017-12-22 NOTE — Patient Instructions (Signed)
Medication Instructions: Your physician recommends that you continue on your current medications as directed.    If you need a refill on your cardiac medications before your next appointment, please call your pharmacy.     Follow-Up: Your physician wants you to follow-up in As needed with Dr. Duke Salviaandolph.   Special Instructions: Check BP Periodically    Thank you for choosing [facility]!!

## 2017-12-26 ENCOUNTER — Ambulatory Visit (HOSPITAL_COMMUNITY)
Admission: RE | Admit: 2017-12-26 | Discharge: 2017-12-26 | Disposition: A | Discharge status: Home / Self Care | Payer: 59 | Source: Ambulatory Visit | Attending: Obstetrics and Gynecology | Admitting: Obstetrics and Gynecology

## 2017-12-26 DIAGNOSIS — D509 Iron deficiency anemia, unspecified: Secondary | ICD-10-CM | POA: Diagnosis present

## 2017-12-26 MED ORDER — SODIUM CHLORIDE 0.9 % IV SOLN
510.0000 mg | INTRAVENOUS | Status: DC
  Administered 2017-12-26: 510 mg via INTRAVENOUS
  Filled 2017-12-26: qty 17

## 2018-01-11 ENCOUNTER — Other Ambulatory Visit: Payer: Self-pay | Admitting: Surgery

## 2018-01-16 ENCOUNTER — Other Ambulatory Visit: Payer: Self-pay | Admitting: Surgery

## 2018-01-23 NOTE — Progress Notes (Signed)
LOV CARDS BARRET PA-C 12-22-17 Epic   ECHO 10-05-17 Epic   EKG 09-20-17 Epic

## 2018-01-23 NOTE — Patient Instructions (Addendum)
Elizabeth Burke  01/23/2018   Your procedure is scheduled on: 01-25-18   Report to [facility] Main  Entrance    Report to admitting at 7:45AM    Call this number if you have problems the morning of surgery [phone]     Remember: NO SOLID FOOD AFTER MIDNIGHT THE NIGHT PRIOR TO SURGERY. NOTHING BY MOUTH EXCEPT CLEAR LIQUIDS UNTIL 3 HOURS PRIOR TO SCHEULED SURGERY. PLEASE FINISH ENSURE DRINK PER SURGEON ORDER 3 HOURS PRIOR TO SCHEDULED SURGERY TIME WHICH NEEDS TO BE COMPLETED AT ___6:45am_________.   CLEAR LIQUID DIET   Foods Allowed                                                                     Foods Excluded  Coffee and tea, regular and decaf                             liquids that you cannot  Plain Jell-O in any flavor                                             see through such as: Fruit ices (not with fruit pulp)                                     milk, soups, orange juice  Iced Popsicles                                    All solid food Carbonated beverages, regular and diet                                    Cranberry, grape and apple juices Sports drinks like Gatorade Lightly seasoned clear broth or consume(fat free) Sugar, honey syrup  Sample Menu Breakfast                                Lunch                                     Supper Cranberry juice                    Beef broth                            Chicken broth Jell-O                                     Grape juice  Apple juice Coffee or tea                        Jell-O                                      Popsicle                                                Coffee or tea                        Coffee or tea  _____________________________________________________________________      Take these medicines the morning of surgery with A SIP OF WATER: NONE                                 You may not have any metal on your body including hair pins  and              piercings  Do not wear jewelry, make-up, lotions, powders or perfumes, deodorant             Do not wear nail polish.  Do not shave  48 hours prior to surgery.        Do not bring valuables to the hospital. [facility] IS NOT             RESPONSIBLE   FOR VALUABLES.  Contacts, dentures or bridgework may not be worn into surgery.  Leave suitcase in the car. After surgery it may be brought to your room.                 Please read over the following fact sheets you were given: _____________________________________________________________________             [facility] Pre[facility] Before surgery, you can play an important role.  Because skin is not sterile, your skin needs to be as free of germs as possible.  You can reduce the number of germs on your skin by washing with CHG (chlorahexidine gluconate) soap before surgery.  CHG is an antiseptic cleaner which kills germs and bonds with the skin to continue killing germs even after washing. Please DO NOT use if you have an allergy to CHG or antibacterial soaps.  If your skin becomes reddened/irritated stop using the CHG and inform your nurse when you arrive at Short Stay. Do not shave (including legs and underarms) for at least 48 hours prior to the first CHG shower.  You may shave your face/neck. Please follow these instructions carefully:  1.  Shower with CHG Soap the night before surgery and the  morning of Surgery.  2.  If you choose to wash your hair, wash your hair first as usual with your  normal  shampoo.  3.  After you shampoo, rinse your hair and body thoroughly to remove the  shampoo.                           4.  Use CHG as you would any other liquid soap.  You can apply chg directly  to the skin and  wash                       Gently with a scrungie or clean washcloth.  5.  Apply the CHG Soap to your body ONLY FROM THE NECK DOWN.   Do not use on face/ open                           Wound or open sores.  Avoid contact with eyes, ears mouth and genitals (private parts).                       Wash face,  Genitals (private parts) with your normal soap.             6.  Wash thoroughly, paying special attention to the area where your surgery  will be performed.  7.  Thoroughly rinse your body with warm water from the neck down.  8.  DO NOT shower/wash with your normal soap after using and rinsing off  the CHG Soap.                9.  Pat yourself dry with a clean towel.            10.  Wear clean pajamas.            11.  Place clean sheets on your bed the night of your first shower and do not  sleep with pets. Day of Surgery : Do not apply any lotions/deodorants the morning of surgery.  Please wear clean clothes to the hospital/surgery center.  FAILURE TO FOLLOW THESE INSTRUCTIONS MAY RESULT IN THE CANCELLATION OF YOUR SURGERY PATIENT SIGNATURE_________________________________  NURSE SIGNATURE__________________________________  ________________________________________________________________________

## 2018-01-24 ENCOUNTER — Other Ambulatory Visit: Payer: Self-pay

## 2018-01-24 ENCOUNTER — Encounter (HOSPITAL_COMMUNITY): Payer: Self-pay

## 2018-01-24 ENCOUNTER — Encounter (HOSPITAL_COMMUNITY)
Admission: RE | Admit: 2018-01-24 | Discharge: 2018-01-24 | Disposition: A | Discharge status: Home / Self Care | Payer: 59 | Source: Ambulatory Visit | Attending: Orthopaedic Surgery | Admitting: Orthopaedic Surgery

## 2018-01-24 DIAGNOSIS — M1990 Unspecified osteoarthritis, unspecified site: Secondary | ICD-10-CM | POA: Diagnosis not present

## 2018-01-24 DIAGNOSIS — L732 Hidradenitis suppurativa: Secondary | ICD-10-CM | POA: Diagnosis not present

## 2018-01-24 DIAGNOSIS — T8131XA Disruption of external operation (surgical) wound, not elsewhere classified, initial encounter: Secondary | ICD-10-CM | POA: Diagnosis present

## 2018-01-24 HISTORY — DX: Disappearance and death of family member: Z63.4

## 2018-01-24 LAB — HCG, SERUM, QUALITATIVE: PREG SERUM: NEGATIVE

## 2018-01-24 LAB — CBC
HCT: 36.4 % (ref 36.0–46.0)
Hemoglobin: 10.8 g/dL — ABNORMAL LOW (ref 12.0–15.0)
MCH: 22.3 pg — AB (ref 26.0–34.0)
MCHC: 29.7 g/dL — ABNORMAL LOW (ref 30.0–36.0)
MCV: 75.1 fL — ABNORMAL LOW (ref 78.0–100.0)
PLATELETS: 427 10*3/uL — AB (ref 150–400)
RBC: 4.85 MIL/uL (ref 3.87–5.11)
RDW: 19.6 % — AB (ref 11.5–15.5)
WBC: 4.6 10*3/uL (ref 4.0–10.5)

## 2018-01-24 NOTE — H&P (Signed)
Elizabeth Burke is an 32 y.o. female.   Chief Complaint: hidradenitis HPI: this is a patient with hidradenitis who had a large excision in the left axilla recently.  She has had significant wound breakdown and a slowly healing wound.  It has failed to improve with wet to dry dressing changes  Past Medical History:  Diagnosis Date  . Allergy    Seasonal  . Arthritis    Spondyloarthropathy--followed by Dr Dierdre ForthBeekman  . Death of child    tearful today , reports 243 mo old son succumbed to SIDS on 12-29-17  . GERD (gastroesophageal reflux disease) 2000 or so  . H/O hiatal hernia   . Headache(784.0)   . Neuromuscular disorder (HCC)    Pins and needles in right arm following surgery for hidradenitis.  . Orthostatic hypotension 09/20/2017  . Scoliosis Dx2006  . Syncope 09/20/2017  . Urinary tract infection     Past Surgical History:  Procedure Laterality Date  . FRACTURE SURGERY Left 2000   Left wrist--casted only, no surgery  . HYDRADENITIS EXCISION Bilateral 07/31/2013   Procedure: EXCISION HYDRADENITIS RIGHT AXILLA  AND LEFT GROIN ;  Surgeon: Shelly Rubensteinouglas A Jeovany Huitron, MD;  Location: [facility];  Service: General;  Laterality: Bilateral;  . HYDRADENITIS EXCISION Left 12/05/2017   Procedure: WIDE EXCISION HIDRADENITIS LEFT AXILLA ERAS PATHWAY;  Surgeon: Abigail MiyamotoBlackman, Mohit Zirbes, MD;  Location: [facility];  Service: General;  Laterality: Left;  . LAPAROSCOPIC TUBAL LIGATION Bilateral 12/08/2017   Procedure: LAPAROSCOPIC TUBAL LIGATION with Filshie clips;  Surgeon: Richarda OverlieHolland, Richard, MD;  Location: [facility];  Service: Gynecology;  Laterality: Bilateral;  . NOSE SURGERY N/A 2012   removal of fish hook     Family History  Problem Relation Age of Onset  . Cancer Maternal Grandmother        bladder and liver  . Diabetes Maternal Grandfather   . Cancer Maternal Grandfather        prostate  . Diabetes Paternal Grandmother   . Hypertension Mother   . Sleep apnea Father   . Ovarian cysts Sister    polycystic ovarian disease  . Arthritis Paternal Grandfather        unknown joint problem   Social History:  reports that she has quit smoking. She started smoking about 13 years ago. She smoked 0.00 packs per day for 0.00 years. She has never used smokeless tobacco. She reports that she has current or past drug history. Drug: Marijuana. She reports that she does not drink alcohol.  Allergies: No Known Allergies  No medications prior to admission.    Results for orders placed or performed during the hospital encounter of 01/24/18 (from the past 48 hour(s))  CBC     Status: Abnormal   Collection Time: 01/24/18 11:49 AM  Result Value Ref Range   WBC 4.6 4.0 - 10.5 K/uL   RBC 4.85 3.87 - 5.11 MIL/uL   Hemoglobin 10.8 (L) 12.0 - 15.0 g/dL   HCT 16.136.4 09.636.0 - 04.546.0 %   MCV 75.1 (L) 78.0 - 100.0 fL   MCH 22.3 (L) 26.0 - 34.0 pg   MCHC 29.7 (L) 30.0 - 36.0 g/dL   RDW 40.919.6 (H) 81.111.5 - 91.415.5 %   Platelets 427 (H) 150 - 400 K/uL    Comment: Performed at [facility], [address] [place], [place] [address]  hCG, serum, qualitative     Status: None   Collection Time: 01/24/18 11:49 AM  Result Value Ref Range  Preg, Serum NEGATIVE NEGATIVE    Comment:        THE SENSITIVITY OF THIS METHODOLOGY IS >10 mIU/mL. Performed at [facility], [address], [place], [place] [address]    No results found.  Review of Systems  All other systems reviewed and are negative.   currently breastfeeding. Physical Exam  Constitutional: She is oriented to person, place, and time. She appears well-developed and well-nourished. No distress.  HENT:  Head: Normocephalic and atraumatic.  Eyes: Pupils are equal, round, and reactive to light.  Neck: Normal range of motion.  Cardiovascular: Normal rate, regular rhythm and normal heart sounds.  Respiratory: Effort normal and breath sounds normal. No respiratory distress.  GI: Soft.  Musculoskeletal: Normal range of  motion. She exhibits no edema.  Neurological: She is alert and oriented to person, place, and time.  Skin: Skin is warm. No rash noted.  Large, clean open wound in the left axilla     Assessment/Plan Large left axillary wound with hidradenitis  Plan re-excision/wound revision in the OR to promote healing.  This will require further xenograft placement and a possible wound VAC.  She agrees to proceed.  Risks were again discussed in detail.  Shelly Rubenstein, MD 01/24/2018, 9:00 PM

## 2018-01-25 ENCOUNTER — Ambulatory Visit (HOSPITAL_COMMUNITY)
Admission: RE | Admit: 2018-01-25 | Discharge: 2018-01-27 | Disposition: A | Discharge status: Home / Self Care | Payer: 59 | Source: Ambulatory Visit | Attending: Surgery | Admitting: Surgery

## 2018-01-25 ENCOUNTER — Encounter (HOSPITAL_COMMUNITY): Payer: Self-pay

## 2018-01-25 ENCOUNTER — Ambulatory Visit (HOSPITAL_COMMUNITY): Payer: 59 | Admitting: Certified Registered Nurse Anesthetist

## 2018-01-25 ENCOUNTER — Telehealth (HOSPITAL_COMMUNITY): Payer: Self-pay | Admitting: *Deleted

## 2018-01-25 ENCOUNTER — Encounter (HOSPITAL_COMMUNITY)
Admission: RE | Disposition: A | Discharge status: Home / Self Care | Payer: Self-pay | Source: Ambulatory Visit | Attending: Surgery

## 2018-01-25 DIAGNOSIS — T8131XA Disruption of external operation (surgical) wound, not elsewhere classified, initial encounter: Secondary | ICD-10-CM | POA: Diagnosis not present

## 2018-01-25 DIAGNOSIS — L732 Hidradenitis suppurativa: Secondary | ICD-10-CM | POA: Diagnosis not present

## 2018-01-25 DIAGNOSIS — M1990 Unspecified osteoarthritis, unspecified site: Secondary | ICD-10-CM | POA: Diagnosis not present

## 2018-01-25 DIAGNOSIS — K219 Gastro-esophageal reflux disease without esophagitis: Secondary | ICD-10-CM | POA: Diagnosis not present

## 2018-01-25 HISTORY — PX: APPLICATION OF WOUND VAC: SHX5189

## 2018-01-25 HISTORY — PX: HYDRADENITIS EXCISION: SHX5243

## 2018-01-25 SURGERY — EXCISION, HIDRADENITIS, AXILLA
Anesthesia: General | Site: Axilla | Laterality: Left

## 2018-01-25 MED ORDER — HYDROMORPHONE HCL 1 MG/ML IJ SOLN: 1.0000 mg | INTRAMUSCULAR | Status: DC | PRN

## 2018-01-25 MED ORDER — BUPIVACAINE HCL (PF) 0.5 % IJ SOLN
INTRAMUSCULAR | Status: DC | PRN
  Administered 2018-01-25: 10 mL

## 2018-01-25 MED ORDER — FENTANYL CITRATE (PF) 100 MCG/2ML IJ SOLN
INTRAMUSCULAR | Status: DC | PRN
  Administered 2018-01-25 (×2): 50 ug via INTRAVENOUS

## 2018-01-25 MED ORDER — GABAPENTIN 300 MG PO CAPS
300.0000 mg | ORAL_CAPSULE | ORAL | Status: AC
  Administered 2018-01-25: 300 mg via ORAL

## 2018-01-25 MED ORDER — ONDANSETRON HCL 4 MG/2ML IJ SOLN: 4.0000 mg | Freq: Four times a day (QID) | INTRAMUSCULAR | Status: DC | PRN

## 2018-01-25 MED ORDER — FENTANYL CITRATE (PF) 100 MCG/2ML IJ SOLN
25.0000 ug | INTRAMUSCULAR | Status: DC | PRN
  Administered 2018-01-25 (×2): 50 ug via INTRAVENOUS

## 2018-01-25 MED ORDER — SODIUM CHLORIDE 0.9 % IV SOLN
INTRAVENOUS | Status: DC
  Administered 2018-01-25: 11:00:00 via INTRAVENOUS

## 2018-01-25 MED ORDER — TRAMADOL HCL 50 MG PO TABS: 50.0000 mg | ORAL_TABLET | Freq: Four times a day (QID) | ORAL | Status: DC | PRN

## 2018-01-25 MED ORDER — CHLORHEXIDINE GLUCONATE CLOTH 2 % EX PADS: 6.0000 | MEDICATED_PAD | Freq: Once | CUTANEOUS | Status: DC

## 2018-01-25 MED ORDER — MEPERIDINE HCL 50 MG/ML IJ SOLN: 6.2500 mg | INTRAMUSCULAR | Status: DC | PRN

## 2018-01-25 MED ORDER — LIDOCAINE 2% (20 MG/ML) 5 ML SYRINGE
INTRAMUSCULAR | Status: AC
  Filled 2018-01-25: qty 15

## 2018-01-25 MED ORDER — CELECOXIB 200 MG PO CAPS
ORAL_CAPSULE | ORAL | Status: AC
  Filled 2018-01-25: qty 1

## 2018-01-25 MED ORDER — CEFAZOLIN SODIUM-DEXTROSE 2-4 GM/100ML-% IV SOLN
2.0000 g | INTRAVENOUS | Status: AC
  Administered 2018-01-25: 2 g via INTRAVENOUS

## 2018-01-25 MED ORDER — ACETAMINOPHEN 500 MG PO TABS
ORAL_TABLET | ORAL | Status: AC
  Filled 2018-01-25: qty 2

## 2018-01-25 MED ORDER — ONDANSETRON HCL 4 MG/2ML IJ SOLN
INTRAMUSCULAR | Status: DC | PRN
  Administered 2018-01-25: 4 mg via INTRAVENOUS

## 2018-01-25 MED ORDER — FENTANYL CITRATE (PF) 100 MCG/2ML IJ SOLN
INTRAMUSCULAR | Status: AC
  Filled 2018-01-25: qty 2

## 2018-01-25 MED ORDER — LIDOCAINE 2% (20 MG/ML) 5 ML SYRINGE
INTRAMUSCULAR | Status: DC | PRN
  Administered 2018-01-25: 60 mg via INTRAVENOUS

## 2018-01-25 MED ORDER — MIDAZOLAM HCL 2 MG/2ML IJ SOLN
INTRAMUSCULAR | Status: AC
  Filled 2018-01-25: qty 2

## 2018-01-25 MED ORDER — SCOPOLAMINE 1 MG/3DAYS TD PT72
1.0000 | MEDICATED_PATCH | Freq: Once | TRANSDERMAL | Status: DC
  Administered 2018-01-25: 1.5 mg via TRANSDERMAL

## 2018-01-25 MED ORDER — PROPOFOL 10 MG/ML IV BOLUS
INTRAVENOUS | Status: DC | PRN
  Administered 2018-01-25: 200 mg via INTRAVENOUS

## 2018-01-25 MED ORDER — PROMETHAZINE HCL 25 MG/ML IJ SOLN: 6.2500 mg | INTRAMUSCULAR | Status: DC | PRN

## 2018-01-25 MED ORDER — 0.9 % SODIUM CHLORIDE (POUR BTL) OPTIME
TOPICAL | Status: DC | PRN
  Administered 2018-01-25: 1000 mL

## 2018-01-25 MED ORDER — GABAPENTIN 300 MG PO CAPS
ORAL_CAPSULE | ORAL | Status: AC
  Filled 2018-01-25: qty 1

## 2018-01-25 MED ORDER — DIPHENHYDRAMINE HCL 50 MG/ML IJ SOLN: 25.0000 mg | Freq: Four times a day (QID) | INTRAMUSCULAR | Status: DC | PRN

## 2018-01-25 MED ORDER — FENTANYL CITRATE (PF) 100 MCG/2ML IJ SOLN
INTRAMUSCULAR | Status: AC
  Filled 2018-01-25: qty 4

## 2018-01-25 MED ORDER — LIDOCAINE 2% (20 MG/ML) 5 ML SYRINGE
INTRAMUSCULAR | Status: DC | PRN
  Administered 2018-01-25: 1.5 mg/kg/h via INTRAVENOUS

## 2018-01-25 MED ORDER — MIDAZOLAM HCL 5 MG/5ML IJ SOLN
INTRAMUSCULAR | Status: DC | PRN
  Administered 2018-01-25: 2 mg via INTRAVENOUS

## 2018-01-25 MED ORDER — SCOPOLAMINE 1 MG/3DAYS TD PT72
MEDICATED_PATCH | TRANSDERMAL | Status: AC
  Filled 2018-01-25: qty 1

## 2018-01-25 MED ORDER — DEXAMETHASONE SODIUM PHOSPHATE 10 MG/ML IJ SOLN
INTRAMUSCULAR | Status: AC
  Filled 2018-01-25: qty 1

## 2018-01-25 MED ORDER — PROPOFOL 10 MG/ML IV BOLUS
INTRAVENOUS | Status: AC
  Filled 2018-01-25: qty 20

## 2018-01-25 MED ORDER — OXYCODONE HCL 5 MG PO TABS
5.0000 mg | ORAL_TABLET | ORAL | Status: DC | PRN
  Administered 2018-01-26: 5 mg via ORAL
  Filled 2018-01-25: qty 1

## 2018-01-25 MED ORDER — ONDANSETRON HCL 4 MG/2ML IJ SOLN
INTRAMUSCULAR | Status: AC
  Filled 2018-01-25: qty 2

## 2018-01-25 MED ORDER — ONDANSETRON 4 MG PO TBDP: 4.0000 mg | ORAL_TABLET | Freq: Four times a day (QID) | ORAL | Status: DC | PRN

## 2018-01-25 MED ORDER — DEXAMETHASONE SODIUM PHOSPHATE 10 MG/ML IJ SOLN
INTRAMUSCULAR | Status: DC | PRN
  Administered 2018-01-25: 10 mg via INTRAVENOUS

## 2018-01-25 MED ORDER — ACETAMINOPHEN 500 MG PO TABS
1000.0000 mg | ORAL_TABLET | ORAL | Status: AC
  Administered 2018-01-25: 1000 mg via ORAL

## 2018-01-25 MED ORDER — CEFAZOLIN SODIUM-DEXTROSE 2-4 GM/100ML-% IV SOLN
INTRAVENOUS | Status: AC
  Filled 2018-01-25: qty 100

## 2018-01-25 MED ORDER — DIPHENHYDRAMINE HCL 25 MG PO CAPS: 25.0000 mg | ORAL_CAPSULE | Freq: Four times a day (QID) | ORAL | Status: DC | PRN

## 2018-01-25 MED ORDER — ENOXAPARIN SODIUM 40 MG/0.4ML ~~LOC~~ SOLN
40.0000 mg | SUBCUTANEOUS | Status: DC
  Administered 2018-01-26 – 2018-01-27 (×2): 40 mg via SUBCUTANEOUS
  Filled 2018-01-25: qty 0.4

## 2018-01-25 MED ORDER — CELECOXIB 200 MG PO CAPS
200.0000 mg | ORAL_CAPSULE | ORAL | Status: AC
  Administered 2018-01-25: 200 mg via ORAL

## 2018-01-25 MED ORDER — BUPIVACAINE-EPINEPHRINE (PF) 0.5% -1:200000 IJ SOLN
INTRAMUSCULAR | Status: AC
  Filled 2018-01-25: qty 30

## 2018-01-25 MED ORDER — LACTATED RINGERS IV SOLN
INTRAVENOUS | Status: DC
  Administered 2018-01-25: 1000 mL via INTRAVENOUS

## 2018-01-25 MED ORDER — MIDAZOLAM HCL 2 MG/2ML IJ SOLN: 0.5000 mg | Freq: Once | INTRAMUSCULAR | Status: DC | PRN

## 2018-01-25 SURGICAL SUPPLY — 41 items
ADH SKN CLS APL DERMABOND .7 (GAUZE/BANDAGES/DRESSINGS)
BANDAGE ACE 6X5 VEL STRL LF (GAUZE/BANDAGES/DRESSINGS) ×3 IMPLANT
BLADE SURG 15 STRL LF DISP TIS (BLADE) ×3 IMPLANT
BLADE SURG 15 STRL SS (BLADE) ×9
CLEANER TIP ELECTROSURG 2X2 (MISCELLANEOUS) ×3 IMPLANT
CLOSURE WOUND 1/2 X4 (GAUZE/BANDAGES/DRESSINGS) ×2
DECANTER SPIKE VIAL GLASS SM (MISCELLANEOUS) ×3 IMPLANT
DERMABOND ADVANCED (GAUZE/BANDAGES/DRESSINGS)
DERMABOND ADVANCED .7 DNX12 (GAUZE/BANDAGES/DRESSINGS) IMPLANT
DRAPE LAPAROTOMY TRNSV 102X78 (DRAPE) ×3 IMPLANT
DRSG VAC ATS SM SENSATRAC (GAUZE/BANDAGES/DRESSINGS) ×2 IMPLANT
ELECT COATED BLADE 2.86 ST (ELECTRODE) ×3 IMPLANT
ELECT PENCIL ROCKER SW 15FT (MISCELLANEOUS) ×3 IMPLANT
ELECT REM PT RETURN 15FT ADLT (MISCELLANEOUS) ×3 IMPLANT
GAUZE 4X4 16PLY RFD (DISPOSABLE) ×3 IMPLANT
GAUZE SPONGE 4X4 12PLY STRL (GAUZE/BANDAGES/DRESSINGS) ×3 IMPLANT
GLOVE BIO SURGEON STRL SZ7 (GLOVE) ×6 IMPLANT
GLOVE BIOGEL PI IND STRL 7.0 (GLOVE) ×1 IMPLANT
GLOVE BIOGEL PI INDICATOR 7.0 (GLOVE) ×2
GLOVE SURG SIGNA 7.5 PF LTX (GLOVE) ×3 IMPLANT
GOWN STRL REUS W/TWL LRG LVL3 (GOWN DISPOSABLE) ×3 IMPLANT
GOWN STRL REUS W/TWL XL LVL3 (GOWN DISPOSABLE) ×6 IMPLANT
KIT BASIN OR (CUSTOM PROCEDURE TRAY) ×3 IMPLANT
MARKER SKIN DUAL TIP RULER LAB (MISCELLANEOUS) ×3 IMPLANT
MATRIX WOUND 3-LAYER 7X10 (Tissue) ×1 IMPLANT
MICROMATRIX 500MG (Tissue) ×3 IMPLANT
NDL HYPO 25X1 1.5 SAFETY (NEEDLE) ×1 IMPLANT
NEEDLE HYPO 22GX1.5 SAFETY (NEEDLE) ×3 IMPLANT
NEEDLE HYPO 25X1 1.5 SAFETY (NEEDLE) ×3 IMPLANT
PACK BASIC VI WITH GOWN DISP (CUSTOM PROCEDURE TRAY) ×3 IMPLANT
SOL PREP POV-IOD 4OZ 10% (MISCELLANEOUS) ×3 IMPLANT
SOLUTION PARTIC MCRMTRX 500MG (Tissue) IMPLANT
SPONGE LAP 4X18 RFD (DISPOSABLE) ×3 IMPLANT
STRIP CLOSURE SKIN 1/2X4 (GAUZE/BANDAGES/DRESSINGS) ×4 IMPLANT
SUT VIC AB 3-0 SH 27 (SUTURE)
SUT VIC AB 3-0 SH 27XBRD (SUTURE) IMPLANT
SYR BULB IRRIGATION 50ML (SYRINGE) ×3 IMPLANT
SYR CONTROL 10ML LL (SYRINGE) ×3 IMPLANT
TOWEL OR 17X26 10 PK STRL BLUE (TOWEL DISPOSABLE) ×3 IMPLANT
WOUND MATRIX 3-LAYER 7X10 (Tissue) ×1 IMPLANT
YANKAUER SUCT BULB TIP 10FT TU (MISCELLANEOUS) ×3 IMPLANT

## 2018-01-25 NOTE — Anesthesia Procedure Notes (Signed)
Procedure Name: LMA Insertion Date/Time: 01/25/2018 9:38 AM Performed by: Lorelee MarketEdathil, Deagen Krass T, CRNA Pre-anesthesia Checklist: Patient identified, Emergency Drugs available, Suction available, Patient being monitored and Timeout performed Patient Re-evaluated:Patient Re-evaluated prior to induction Oxygen Delivery Method: Circle system utilized Preoxygenation: Pre-oxygenation with 100% oxygen Induction Type: IV induction LMA: LMA inserted LMA Size: 3.0 Number of attempts: 1 Dental Injury: Teeth and Oropharynx as per pre-operative assessment

## 2018-01-25 NOTE — Plan of Care (Signed)
  Problem: Education: Goal: Knowledge of General Education information will improve Description: Including pain rating scale, medication(s)/side effects and non-pharmacologic comfort measures Outcome: Progressing   Problem: Health Behavior/Discharge Planning: Goal: Ability to manage health-related needs will improve Outcome: Progressing   Problem: Clinical Measurements: Goal: Will remain free from infection Outcome: Progressing   Problem: Activity: Goal: Risk for activity intolerance will decrease Outcome: Progressing   Problem: Nutrition: Goal: Adequate nutrition will be maintained Outcome: Progressing   Problem: Elimination: Goal: Will not experience complications related to bowel motility Outcome: Progressing   Problem: Pain Managment: Goal: General experience of comfort will improve Outcome: Progressing   Problem: Safety: Goal: Ability to remain free from injury will improve Outcome: Progressing   Problem: Skin Integrity: Goal: Risk for impaired skin integrity will decrease Outcome: Progressing   

## 2018-01-25 NOTE — Anesthesia Postprocedure Evaluation (Signed)
Anesthesia Post Note  Patient: Elizabeth Donovaniffany J Burke  Procedure(s) Performed: LEFT AXILLARY WOUND EXPLORATION ERAS PATHWAY (Left Axilla) APPLICATION OF WOUND VAC (Left Axilla)     Patient location during evaluation: PACU Anesthesia Type: General Level of consciousness: awake and alert Pain management: pain level controlled Vital Signs Assessment: post-procedure vital signs reviewed and stable Respiratory status: spontaneous breathing, nonlabored ventilation and respiratory function stable Cardiovascular status: blood pressure returned to baseline and stable Postop Assessment: no apparent nausea or vomiting Anesthetic complications: no    Last Vitals:  Vitals:   01/25/18 1239 01/25/18 1327  BP: 110/75 125/84  Pulse: (!) 45 (!) 44  Resp: 16 15  Temp: 36.7 C (!) 36.3 C  SpO2:  97%    Last Pain:  Vitals:   01/25/18 1327  TempSrc: Oral  PainSc:                  Cecile HearingStephen Edward Turk

## 2018-01-25 NOTE — Interval H&P Note (Signed)
History and Physical Interval Note: no change in H and P  01/25/2018 9:07 AM  Elizabeth Burke  has presented today for surgery, with the diagnosis of hidradenitis  The various methods of treatment have been discussed with the patient and family. After consideration of risks, benefits and other options for treatment, the patient has consented to  Procedure(s): LEFT AXILLARY WOUND EXPLORATION ERAS PATHWAY (Left) as a surgical intervention .  The patient's history has been reviewed, patient examined, no change in status, stable for surgery.  I have reviewed the patient's chart and labs.  Questions were answered to the patient's satisfaction.     Mahogany Torrance A

## 2018-01-25 NOTE — Anesthesia Preprocedure Evaluation (Addendum)
Anesthesia Evaluation  Patient identified by MRN, date of birth, ID band Patient awake    Reviewed: Allergy & Precautions, NPO status , Patient's Chart, lab work & pertinent test results  History of Anesthesia Complications Negative for: history of anesthetic complications  Airway Mallampati: II  TM Distance: >3 FB Neck ROM: Full    Dental  (+) Dental Advisory Given   Pulmonary Current Smoker,    breath sounds clear to auscultation       Cardiovascular negative cardio ROS   Rhythm:Regular Rate:Normal     Neuro/Psych PSYCHIATRIC DISORDERS (recent death of child) negative neurological ROS     GI/Hepatic Neg liver ROS, GERD  Controlled,  Endo/Other  negative endocrine ROS  Renal/GU negative Renal ROS     Musculoskeletal  (+) Arthritis ,   Abdominal   Peds  Hematology negative hematology ROS (+)   Anesthesia Other Findings   Reproductive/Obstetrics S/p BTL 01/24/18 preg test NEG                            Anesthesia Physical Anesthesia Plan  ASA: II  Anesthesia Plan: General   Post-op Pain Management:    Induction: Intravenous  PONV Risk Score and Plan: 2 and Ondansetron, Dexamethasone and Scopolamine patch - Pre-op  Airway Management Planned: LMA  Additional Equipment:   Intra-op Plan:   Post-operative Plan:   Informed Consent: I have reviewed the patients History and Physical, chart, labs and discussed the procedure including the risks, benefits and alternatives for the proposed anesthesia with the patient or authorized representative who has indicated his/her understanding and acceptance.   Dental advisory given  Plan Discussed with: CRNA and Surgeon  Anesthesia Plan Comments: (Plan routine monitors, GA- LMA OK)        Anesthesia Quick Evaluation

## 2018-01-25 NOTE — Op Note (Signed)
LEFT AXILLARY WOUND EXPLORATION ERAS PATHWAY  Procedure Note  Elizabeth Burke 01/25/2018   Pre-op Diagnosis: hidradenitis     Post-op Diagnosis: same  Procedure(s): LEFT AXILLARY WOUND REVISION PLACEMENT A-CELL POWDER AND GRAFT PLACEMENT WOUND VAC  Surgeon(s): Abigail MiyamotoBlackman, Dion Parrow, MD  Anesthesia: General  Staff:  Circulator: Cephus ShellingMoseley, Janet L, RN Scrub Person: Donald PoreBlackwell, Deborah A, CST  Estimated Blood Loss: Minimal               Indications: This is a 32 year old female with severe hidradenitis.  She has had a wide excision twice in the left axilla there is and had a breakdown of the wound and continued hidradenitis.  The decision was made to proceed to the operating room for further wound care  Procedure: The patient was brought to the operating room and identified as correct patient.  She is placed upon the operating table and anesthesia was induced.  Her left axillary wound was then prepped and draped in usual sterile fashion.  I excised some firm hard scar at the lateral edge of the open wound.  This was only about a 2 cm area of skin.  There was no evidence of infection in the open wound.  At this point 500 g of a-cell powder was then placed into the wound.  I then used an A-cell sheet measuring approximately 9 x 7 cm into the wound and sutured in place with chromic sutures.  A piece of Adaptic was then placed over this.  We then placed K-Y jelly on this and then a wound VAC sponge.  The adhesive was then placed over the VAC sponge.  It was then hooked up to the wound VAC device and a good seal appeared to be achieved.  The patient tolerated procedure well.  All the counts were correct at the end the procedure.  The patient was then extubated in the operating room and taken in stable condition to recovery room.          Jazon Jipson A   Date: 01/25/2018  Time: 10:22 AM

## 2018-01-25 NOTE — Progress Notes (Signed)
Pt resting in bed.  Son at bedside.  Pt calm and cooperative.  Denies the need to void at this time.

## 2018-01-25 NOTE — Transfer of Care (Signed)
Immediate Anesthesia Transfer of Care Note  Patient: Elizabeth Burke  Procedure(s) Performed: LEFT AXILLARY WOUND EXPLORATION ERAS PATHWAY (Left Axilla) APPLICATION OF WOUND VAC (Left Axilla)  Patient Location: PACU  Anesthesia Type:General  Level of Consciousness: awake, alert , oriented and patient cooperative  Airway & Oxygen Therapy: Patient Spontanous Breathing and Patient connected to face mask oxygen  Post-op Assessment: Report given to RN, Post -op Vital signs reviewed and stable and Patient moving all extremities  Post vital signs: Reviewed and stable  Last Vitals:  Vitals Value Taken Time  BP 128/73 01/25/2018 10:27 AM  Temp    Pulse 48 01/25/2018 10:29 AM  Resp 23 01/25/2018 10:29 AM  SpO2 100 % 01/25/2018 10:29 AM  Vitals shown include unvalidated device data.  Last Pain:  Vitals:   01/25/18 0838  TempSrc:   PainSc: 0-No pain      Patients Stated Pain Goal: 4 (01/25/18 [id number])  Complications: No apparent anesthesia complications

## 2018-01-26 ENCOUNTER — Encounter (HOSPITAL_COMMUNITY): Payer: Self-pay | Admitting: Surgery

## 2018-01-26 DIAGNOSIS — T8131XA Disruption of external operation (surgical) wound, not elsewhere classified, initial encounter: Secondary | ICD-10-CM | POA: Diagnosis not present

## 2018-01-26 LAB — CREATININE, SERUM: CREATININE: 0.47 mg/dL (ref 0.44–1.00)

## 2018-01-26 MED ORDER — SODIUM CHLORIDE 0.9 % IV SOLN: 250.0000 mL | INTRAVENOUS | Status: DC | PRN

## 2018-01-26 MED ORDER — SODIUM CHLORIDE 0.9% FLUSH
3.0000 mL | Freq: Two times a day (BID) | INTRAVENOUS | Status: DC
  Administered 2018-01-26: 3 mL via INTRAVENOUS

## 2018-01-26 MED ORDER — SODIUM CHLORIDE 0.9% FLUSH: 3.0000 mL | INTRAVENOUS | Status: DC | PRN

## 2018-01-26 NOTE — Progress Notes (Signed)
1 Day Post-Op   Subjective/Chief Complaint: Doing well   Objective: Vital signs in last 24 hours: Temp:  [97.4 F (36.3 C)-99.1 F (37.3 C)] 99.1 F (37.3 C) (07/25 0609) Pulse Rate:  [42-55] 54 (07/25 0609) Resp:  [15-22] 17 (07/25 0609) BP: (104-156)/(73-106) 149/78 (07/25 0609) SpO2:  [97 %-100 %] 98 % (07/25 0609) Last BM Date: 01/25/18  Intake/Output from previous day: 07/24 0701 - 07/25 0700 In: 1543.8 [P.O.:350; I.V.:1093.8; IV Piggyback:100] Out: 25 [Blood:25] Intake/Output this shift: No intake/output data recorded.  Exam: Left axillary wound VAC in place, maintaining suction  Lab Results:  Recent Labs    01/24/18 1149  WBC 4.6  HGB 10.8*  HCT 36.4  PLT 427*   BMET Recent Labs    01/26/18 0503  CREATININE 0.47   PT/INR No results for input(s): LABPROT, INR in the last 72 hours. ABG No results for input(s): PHART, HCO3 in the last 72 hours.  Invalid input(s): PCO2, PO2  Studies/Results: No results found.  Anti-infectives: Anti-infectives (From admission, onward)   Start     Dose/Rate Route Frequency Ordered Stop   01/25/18 0830  ceFAZolin (ANCEF) IVPB 2g/100 mL premix     2 g 200 mL/hr over 30 Minutes Intravenous On call to O.R. 01/25/18 [id number] 01/25/18 0943   01/25/18 0826  ceFAZolin (ANCEF) 2-4 GM/100ML-% IVPB    Note to Pharmacy:  Curlene DolphinWhitlow, Cheryl   : cabinet override      01/25/18 0826 01/25/18 0943      Assessment/Plan: s/p Procedure(s): LEFT AXILLARY WOUND EXPLORATION ERAS PATHWAY (Left) APPLICATION OF WOUND VAC (Left)  [facility]  LOS: 1 day    Brie Eppard A 01/26/2018

## 2018-01-27 DIAGNOSIS — T8131XA Disruption of external operation (surgical) wound, not elsewhere classified, initial encounter: Secondary | ICD-10-CM | POA: Diagnosis not present

## 2018-01-27 DIAGNOSIS — T8189XA Other complications of procedures, not elsewhere classified, initial encounter: Secondary | ICD-10-CM | POA: Diagnosis not present

## 2018-01-27 MED ORDER — HYDROMORPHONE HCL 2 MG PO TABS: 2.0000 mg | ORAL_TABLET | Freq: Four times a day (QID) | ORAL | 0 refills | Status: DC | PRN

## 2018-01-27 MED ORDER — OXYCODONE HCL 5 MG PO TABS: 5.0000 mg | ORAL_TABLET | Freq: Four times a day (QID) | ORAL | 0 refills | Status: DC | PRN

## 2018-01-27 NOTE — Discharge Summary (Signed)
Physician Discharge Summary  Patient ID: Elizabeth Donovaniffany J Farnworth MRN: [id number] DOB/AGE: 09/18/1985 32 y.o.  Admit date: 01/25/2018 Discharge date: 01/27/2018  Admission Diagnoses:  Discharge Diagnoses:  Active Problems:   Hidradenitis axillaris   Discharged Condition: good  Hospital Course: patient taken to OR for left axillary wound revision, placement of VAC.  Tolerated well.  VAC changed at bedside on POD#2.  Tolerated well.  D/c home.  Consults: None  Significant Diagnostic Studies: 0  Treatments: surgery: left axillary wound revision, placement of Xenograft and wound VAC  Discharge Exam: Blood pressure (!) 145/94, pulse (!) 52, temperature 98 F (36.7 C), temperature source Oral, resp. rate 18, height 5' 7.75" (1.721 m), weight 88.9 kg (196 lb), last menstrual period 12/25/2017, SpO2 97 %, currently breastfeeding. General appearance: alert, cooperative and no distress Incision/Wound: clean  Disposition: Discharge disposition: 01-Home or Self Care       Discharge Instructions    Diet - low sodium heart healthy   Complete by:  As directed    Increase activity slowly   Complete by:  As directed      Allergies as of 01/27/2018   No Known Allergies     Medication List    TAKE these medications   ibuprofen 800 MG tablet Commonly known as:  ADVIL,MOTRIN Take 800 mg by mouth every 8 (eight) hours as needed. FOR PAIN   oxyCODONE 5 MG immediate release tablet Commonly known as:  Oxy IR/ROXICODONE Take 1 tablet (5 mg total) by mouth every 6 (six) hours as needed for moderate pain.      Follow-up Information    Abigail MiyamotoBlackman, Esai Stecklein, MD. Schedule an appointment as soon as possible for a visit on 02/15/2018.   Specialty:  General Surgery Contact information: [address] [place] [address] [phone]           Signed: Shelly RubensteinBLACKMAN,Lucielle Vokes A 01/27/2018, 9:35 AM

## 2018-01-27 NOTE — Progress Notes (Addendum)
Patient ID: Elizabeth Burke, female   DOB: 08-Sep-1985, 32 y.o.   MRN: [id number]  Wound vac changed at bedside Wound in left axilla measures 7 cm x 4 cm x 1 cm  Plan: D/c home with home wound VAC and M-W-F vac changes

## 2018-01-27 NOTE — Discharge Instructions (Signed)
[facility] _______[facility] ° °UMBILICAL OR INGUINAL HERNIA REPAIR: POST OP INSTRUCTIONS ° °Always review your discharge instruction sheet given to you by the facility where your surgery was performed. °IF YOU HAVE DISABILITY OR FAMILY LEAVE FORMS, YOU MUST BRING THEM TO THE OFFICE FOR PROCESSING.   °DO NOT GIVE THEM TO YOUR DOCTOR. ° °1. A  prescription for pain medication may be given to you upon discharge.  Take your pain medication as prescribed, if needed.  If narcotic pain medicine is not needed, then you may take acetaminophen (Tylenol) or ibuprofen (Advil) as needed. °2. Take your usually prescribed medications unless otherwise directed. °If you need a refill on your pain medication, please contact your pharmacy.  They will contact our office to request authorization. Prescriptions will not be filled after 5 pm or on week-ends. °3. You should follow a light diet the first 24 hours after arrival home, such as soup and crackers, etc.  Be sure to include lots of fluids daily.  Resume your normal diet the day after surgery. °4.Most patients will experience some swelling and bruising around the umbilicus or in the groin and scrotum.  Ice packs and reclining will help.  Swelling and bruising can take several days to resolve.  °6. It is common to experience some constipation if taking pain medication after surgery.  Increasing fluid intake and taking a stool softener (such as Colace) will usually help or prevent this problem from occurring.  A mild laxative (Milk of Magnesia or Miralax) should be taken according to package directions if there are no bowel movements after 48 hours. °7. Unless discharge instructions indicate otherwise, you may remove your bandages 24-48 hours after surgery, and you may shower at that time.  You may have steri-strips (small skin tapes) in place directly over the incision.  These strips should be left on the skin for 7-10 days.  If your surgeon used skin glue on the  incision, you may shower in 24 hours.  The glue will flake off over the next 2-3 weeks.  Any sutures or staples will be removed at the office during your follow-up visit. °8. ACTIVITIES:  You may resume regular (light) daily activities beginning the next day--such as daily self-care, walking, climbing stairs--gradually increasing activities as tolerated.  You may have sexual intercourse when it is comfortable.  Refrain from any heavy lifting or straining until approved by your doctor. ° °a.You may drive when you are no longer taking prescription pain medication, you can comfortably wear a seatbelt, and you can safely maneuver your car and apply brakes. °b.RETURN TO WORK:   °_____________________________________________ ° °9.You should see your doctor in the office for a follow-up appointment approximately 2-3 weeks after your surgery.  Make sure that you call for this appointment within a day or two after you arrive home to insure a convenient appointment time. °10.OTHER INSTRUCTIONS: _________________________ °   _____________________________________ ° °WHEN TO CALL YOUR DOCTOR: °1. Fever over 101.0 °2. Inability to urinate °3. Nausea and/or vomiting °4. Extreme swelling or bruising °5. Continued bleeding from incision. °6. Increased pain, redness, or drainage from the incision ° °The clinic staff is available to answer your questions during regular business hours.  Please don’t hesitate to call and ask to speak to one of the nurses for clinical concerns.  If you have a medical emergency, go to the nearest emergency room or call 911.  A surgeon from [facility] is always on call at the hospital ° ° °  [address], [address], [place], [place]  [address] ? ° [address] [address], [place], [place]   [address] °[phone] ? [phone] ? FAX [phone] °Web site: [email or web address] °

## 2018-01-27 NOTE — Progress Notes (Signed)
Discharge and medication instructions reviewed with patient. Questions answered and patient denies further questions. One prescription given to patient. Family member is driving patient home. Lina SarBeth Cray Monnin, RN

## 2018-01-27 NOTE — Progress Notes (Deleted)
Patient ID: Elizabeth Burke, female   DOB: 1986/06/16, 32 y.o.   MRN: [id number]   Doing well Abdomen soft  Plan: Discharge home

## 2018-01-29 DIAGNOSIS — T8131XA Disruption of external operation (surgical) wound, not elsewhere classified, initial encounter: Secondary | ICD-10-CM | POA: Diagnosis not present

## 2018-01-29 DIAGNOSIS — Z945 Skin transplant status: Secondary | ICD-10-CM | POA: Diagnosis not present

## 2018-01-29 DIAGNOSIS — L732 Hidradenitis suppurativa: Secondary | ICD-10-CM | POA: Diagnosis not present

## 2018-01-30 DIAGNOSIS — T8189XA Other complications of procedures, not elsewhere classified, initial encounter: Secondary | ICD-10-CM | POA: Diagnosis not present

## 2018-01-31 DIAGNOSIS — T8131XA Disruption of external operation (surgical) wound, not elsewhere classified, initial encounter: Secondary | ICD-10-CM | POA: Diagnosis not present

## 2018-01-31 DIAGNOSIS — L732 Hidradenitis suppurativa: Secondary | ICD-10-CM | POA: Diagnosis not present

## 2018-01-31 DIAGNOSIS — Z945 Skin transplant status: Secondary | ICD-10-CM | POA: Diagnosis not present

## 2018-02-02 DIAGNOSIS — T8131XA Disruption of external operation (surgical) wound, not elsewhere classified, initial encounter: Secondary | ICD-10-CM | POA: Diagnosis not present

## 2018-02-02 DIAGNOSIS — Z945 Skin transplant status: Secondary | ICD-10-CM | POA: Diagnosis not present

## 2018-02-02 DIAGNOSIS — L732 Hidradenitis suppurativa: Secondary | ICD-10-CM | POA: Diagnosis not present

## 2018-02-06 DIAGNOSIS — Z945 Skin transplant status: Secondary | ICD-10-CM | POA: Diagnosis not present

## 2018-02-06 DIAGNOSIS — L732 Hidradenitis suppurativa: Secondary | ICD-10-CM | POA: Diagnosis not present

## 2018-02-06 DIAGNOSIS — T8131XA Disruption of external operation (surgical) wound, not elsewhere classified, initial encounter: Secondary | ICD-10-CM | POA: Diagnosis not present

## 2018-02-10 DIAGNOSIS — Z945 Skin transplant status: Secondary | ICD-10-CM | POA: Diagnosis not present

## 2018-02-10 DIAGNOSIS — M064 Inflammatory polyarthropathy: Secondary | ICD-10-CM | POA: Diagnosis not present

## 2018-02-10 DIAGNOSIS — T8131XA Disruption of external operation (surgical) wound, not elsewhere classified, initial encounter: Secondary | ICD-10-CM | POA: Diagnosis not present

## 2018-02-10 DIAGNOSIS — L732 Hidradenitis suppurativa: Secondary | ICD-10-CM | POA: Diagnosis not present

## 2018-02-10 DIAGNOSIS — M459 Ankylosing spondylitis of unspecified sites in spine: Secondary | ICD-10-CM | POA: Diagnosis not present

## 2018-02-13 DIAGNOSIS — L732 Hidradenitis suppurativa: Secondary | ICD-10-CM | POA: Diagnosis not present

## 2018-02-13 DIAGNOSIS — Z945 Skin transplant status: Secondary | ICD-10-CM | POA: Diagnosis not present

## 2018-02-13 DIAGNOSIS — T8131XA Disruption of external operation (surgical) wound, not elsewhere classified, initial encounter: Secondary | ICD-10-CM | POA: Diagnosis not present

## 2018-02-17 DIAGNOSIS — Z945 Skin transplant status: Secondary | ICD-10-CM | POA: Diagnosis not present

## 2018-02-17 DIAGNOSIS — L732 Hidradenitis suppurativa: Secondary | ICD-10-CM | POA: Diagnosis not present

## 2018-02-17 DIAGNOSIS — T8131XA Disruption of external operation (surgical) wound, not elsewhere classified, initial encounter: Secondary | ICD-10-CM | POA: Diagnosis not present

## 2018-02-22 DIAGNOSIS — L732 Hidradenitis suppurativa: Secondary | ICD-10-CM | POA: Diagnosis not present

## 2018-02-22 DIAGNOSIS — Z945 Skin transplant status: Secondary | ICD-10-CM | POA: Diagnosis not present

## 2018-02-22 DIAGNOSIS — T8131XA Disruption of external operation (surgical) wound, not elsewhere classified, initial encounter: Secondary | ICD-10-CM | POA: Diagnosis not present

## 2018-02-24 DIAGNOSIS — M064 Inflammatory polyarthropathy: Secondary | ICD-10-CM | POA: Diagnosis not present

## 2018-02-24 DIAGNOSIS — M459 Ankylosing spondylitis of unspecified sites in spine: Secondary | ICD-10-CM | POA: Diagnosis not present

## 2018-02-24 DIAGNOSIS — T8131XA Disruption of external operation (surgical) wound, not elsewhere classified, initial encounter: Secondary | ICD-10-CM | POA: Diagnosis not present

## 2018-02-24 DIAGNOSIS — L732 Hidradenitis suppurativa: Secondary | ICD-10-CM | POA: Diagnosis not present

## 2018-02-24 DIAGNOSIS — Z945 Skin transplant status: Secondary | ICD-10-CM | POA: Diagnosis not present

## 2018-03-29 DIAGNOSIS — M459 Ankylosing spondylitis of unspecified sites in spine: Secondary | ICD-10-CM | POA: Diagnosis not present

## 2018-05-02 DIAGNOSIS — L7682 Other postprocedural complications of skin and subcutaneous tissue: Secondary | ICD-10-CM | POA: Diagnosis not present

## 2018-05-26 DIAGNOSIS — M459 Ankylosing spondylitis of unspecified sites in spine: Secondary | ICD-10-CM | POA: Diagnosis not present

## 2018-06-24 ENCOUNTER — Ambulatory Visit (HOSPITAL_COMMUNITY)
Admission: EM | Admit: 2018-06-24 | Discharge: 2018-06-24 | Disposition: A | Discharge status: Home / Self Care | Payer: 59 | Attending: Family Medicine | Admitting: Family Medicine

## 2018-06-24 ENCOUNTER — Encounter (HOSPITAL_COMMUNITY): Payer: Self-pay

## 2018-06-24 ENCOUNTER — Other Ambulatory Visit: Payer: Self-pay

## 2018-06-24 DIAGNOSIS — M419 Scoliosis, unspecified: Secondary | ICD-10-CM | POA: Diagnosis not present

## 2018-06-24 DIAGNOSIS — Z3202 Encounter for pregnancy test, result negative: Secondary | ICD-10-CM | POA: Diagnosis not present

## 2018-06-24 DIAGNOSIS — K219 Gastro-esophageal reflux disease without esophagitis: Secondary | ICD-10-CM | POA: Diagnosis not present

## 2018-06-24 DIAGNOSIS — N3001 Acute cystitis with hematuria: Secondary | ICD-10-CM | POA: Diagnosis not present

## 2018-06-24 DIAGNOSIS — M1990 Unspecified osteoarthritis, unspecified site: Secondary | ICD-10-CM | POA: Diagnosis not present

## 2018-06-24 DIAGNOSIS — Z8249 Family history of ischemic heart disease and other diseases of the circulatory system: Secondary | ICD-10-CM | POA: Diagnosis not present

## 2018-06-24 DIAGNOSIS — Z87891 Personal history of nicotine dependence: Secondary | ICD-10-CM | POA: Insufficient documentation

## 2018-06-24 LAB — POCT URINALYSIS DIP (DEVICE)
Glucose, UA: NEGATIVE mg/dL
Nitrite: POSITIVE — AB
Protein, ur: >=300 mg/dL — AB
SPECIFIC GRAVITY, URINE: 1.02 (ref 1.005–1.030)
UROBILINOGEN UA: 4 mg/dL — AB (ref 0.0–1.0)
pH: 8.5 — ABNORMAL HIGH (ref 5.0–8.0)

## 2018-06-24 LAB — POCT PREGNANCY, URINE: Preg Test, Ur: NEGATIVE

## 2018-06-24 MED ORDER — NITROFURANTOIN MONOHYD MACRO 100 MG PO CAPS: 100.0000 mg | ORAL_CAPSULE | Freq: Two times a day (BID) | ORAL | 0 refills | Status: DC

## 2018-06-24 MED ORDER — PHENAZOPYRIDINE HCL 200 MG PO TABS: 200.0000 mg | ORAL_TABLET | Freq: Three times a day (TID) | ORAL | 0 refills | Status: DC

## 2018-06-24 NOTE — ED Provider Notes (Signed)
[facility]   CC: UTI symptoms  SUBJECTIVE:  Elizabeth Burke is a 32 y.o. female who complains of urinary frequency, urgency and dysuria for the past 1-2 days.  Patient admits to sexual activity yesterday and holding urine.  Denies abdominal or flank pain.  Has tried cranberry juice and drinking alcohol without relief.  Symptoms are made worse with urination.  Admits to similar symptoms in the past.  Complains of associated hematuria and chills.  Denies fever, nausea, vomiting, abdominal pain, flank pain, abnormal vaginal discharge or bleeding.    LMP: 06/12/18 Had tubal ligation  ROS: As in HPI.  Past Medical History:  Diagnosis Date  . Allergy    Seasonal  . Arthritis    Spondyloarthropathy--followed by Dr Dierdre ForthBeekman  . Death of child    tearful today , reports 753 mo old son succumbed to SIDS on 12-29-17  . GERD (gastroesophageal reflux disease) 2000 or so  . H/O hiatal hernia   . Headache(784.0)   . Neuromuscular disorder (HCC)    Pins and needles in right arm following surgery for hidradenitis.  . Orthostatic hypotension 09/20/2017  . Scoliosis Dx2006  . Syncope 09/20/2017  . Urinary tract infection    Past Surgical History:  Procedure Laterality Date  . APPLICATION OF WOUND VAC Left 01/25/2018   Procedure: APPLICATION OF WOUND VAC;  Surgeon: Abigail MiyamotoBlackman, Douglas, MD;  Location: [facility];  Service: General;  Laterality: Left;  . FRACTURE SURGERY Left 2000   Left wrist--casted only, no surgery  . HYDRADENITIS EXCISION Bilateral 07/31/2013   Procedure: EXCISION HYDRADENITIS RIGHT AXILLA  AND LEFT GROIN ;  Surgeon: Shelly Rubensteinouglas A Blackman, MD;  Location: [facility];  Service: General;  Laterality: Bilateral;  . HYDRADENITIS EXCISION Left 12/05/2017   Procedure: WIDE EXCISION HIDRADENITIS LEFT AXILLA ERAS PATHWAY;  Surgeon: Abigail MiyamotoBlackman, Douglas, MD;  Location: [facility];  Service: General;  Laterality: Left;  . HYDRADENITIS EXCISION Left 01/25/2018   Procedure: LEFT AXILLARY  WOUND EXPLORATION ERAS PATHWAY;  Surgeon: Abigail MiyamotoBlackman, Douglas, MD;  Location: [facility];  Service: General;  Laterality: Left;  . LAPAROSCOPIC TUBAL LIGATION Bilateral 12/08/2017   Procedure: LAPAROSCOPIC TUBAL LIGATION with Filshie clips;  Surgeon: Richarda OverlieHolland, Richard, MD;  Location: [facility];  Service: Gynecology;  Laterality: Bilateral;  . NOSE SURGERY N/A 2012   removal of fish hook    No Known Allergies No current facility-administered medications on file prior to encounter.    No current outpatient medications on file prior to encounter.   Social History   Socioeconomic History  . Marital status: Single    Spouse name: Not on file  . Number of children: 1  . Years of education: 3512  . Highest education level: Not on file  Occupational History  . 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  Social Needs  . Financial resource strain: Not on file  . Food insecurity:    Worry: Not on file    Inability: Not on file  . Transportation needs:    Medical: Not on file    Non-medical: Not on file  Tobacco Use  . Smoking status: Former Smoker    Packs/day: 0.00    Years: 0.00    Pack years: 0.00    Start date: 07/05/2004  . Smokeless tobacco: Never Used  Substance and Sexual Activity  . Alcohol use: No    Alcohol/week: 0.0 standard drinks  . Drug use: Not Currently    Types: Marijuana  . Sexual activity: Yes  Birth control/protection: None  Lifestyle  . Physical activity:    Days per week: Not on file    Minutes per session: Not on file  . Stress: Not on file  Relationships  . Social connections:    Talks on phone: Not on file    Gets together: Not on file    Attends religious service: Not on file    Active member of club or organization: Not on file    Attends meetings of clubs or organizations: Not on file    Relationship status: Not on file  . Intimate partner violence:    Fear of current or ex partner: Not on file    Emotionally abused: Not on  file    Physically abused: Not on file    Forced sexual activity: Not on file  Other Topics Concern  . Not on file  Social History Narrative   [place]   Lives at home with son.   Family History  Problem Relation Age of Onset  . Cancer Maternal Grandmother        bladder and liver  . Diabetes Maternal Grandfather   . Cancer Maternal Grandfather        prostate  . Diabetes Paternal Grandmother   . Hypertension Mother   . Sleep apnea Father   . Ovarian cysts Sister        polycystic ovarian disease  . Arthritis Paternal Grandfather        unknown joint problem    OBJECTIVE:  Vitals:   06/24/18 1837 06/24/18 1838 06/24/18 1842  BP: 136/74 128/71   Pulse: 88    Resp: 18    Temp: 98 F (36.7 C)    TempSrc: Oral Oral   SpO2: 100%    Weight:   206 lb (93.4 kg)   General appearance: AOx3 in no acute distress HEENT: NCAT.  Oropharynx clear.  Lungs: clear to auscultation bilaterally without adventitious breath sounds Heart: regular rate and rhythm.  Radial pulses 2+ symmetrical bilaterally Abdomen: soft; non-distended; no tenderness; bowel sounds present; no guarding Back: no CVA tenderness Extremities: no edema; symmetrical with no gross deformities Skin: warm and dry Neurologic: Ambulates from chair to exam table without difficulty Psychological: alert and cooperative; normal mood and affect  Labs Reviewed  POCT URINALYSIS DIP (DEVICE) - Abnormal; Notable for the following components:      Result Value   Bilirubin Urine SMALL (*)    Ketones, ur TRACE (*)    Hgb urine dipstick LARGE (*)    pH 8.5 (*)    Protein, ur >=300 (*)    Urobilinogen, UA 4.0 (*)    Nitrite POSITIVE (*)    Leukocytes, UA LARGE (*)    All other components within normal limits  URINE CULTURE  POC URINE PREG, ED  POCT PREGNANCY, URINE    ASSESSMENT & PLAN:  1. Acute cystitis with hematuria     Meds ordered this encounter  Medications  . nitrofurantoin,  macrocrystal-monohydrate, (MACROBID) 100 MG capsule    Sig: Take 1 capsule (100 mg total) by mouth 2 (two) times daily.    Dispense:  10 capsule    Refill:  0    Order Specific Question:   Supervising Provider    Answer:   Eustace MooreNELSON, YVONNE SUE [[id number]]  . phenazopyridine (PYRIDIUM) 200 MG tablet    Sig: Take 1 tablet (200 mg total) by mouth 3 (three) times daily.    Dispense:  6 tablet    Refill:  0    Order Specific Question:   Supervising Provider    Answer:   Eustace Moore [[id number]]   Urine did show signs of infection Urine pregnancy was negative.   Urine culture sent.  We will call you with the results.   Push fluids and get plenty of rest.   Take antibiotic as directed and to completion Take pyridium as prescribed and as needed for symptomatic relief Follow up with PCP if symptoms persists Instructed pt to follow up with PCP in 1-2 weeks to ensure hematuria has resolved.   Return here or go to ER if you have any new or worsening symptoms such as fever, worsening abdominal pain, nausea/vomiting, flank pain, etc...  Outlined signs and symptoms indicating need for more acute intervention. Patient verbalized understanding. After Visit Summary given.     Rennis Harding, PA-C 06/24/18 1902

## 2018-06-24 NOTE — ED Triage Notes (Addendum)
Pt cc UTI x 3 days or more. Pt also has blood in her urine.

## 2018-06-24 NOTE — Discharge Instructions (Signed)
Urine did show signs of infection Urine pregnancy was negative.   Urine culture sent.  We will call you with the results.   Push fluids and get plenty of rest.   Take antibiotic as directed and to completion Take pyridium as prescribed and as needed for symptomatic relief Follow up with PCP if symptoms persists Return here or go to ER if you have any new or worsening symptoms such as fever, worsening abdominal pain, nausea/vomiting, flank pain, etc...Marland Kitchen

## 2018-06-26 ENCOUNTER — Telehealth (HOSPITAL_COMMUNITY): Payer: Self-pay | Admitting: Emergency Medicine

## 2018-06-26 LAB — URINE CULTURE

## 2018-06-26 NOTE — Telephone Encounter (Signed)
Urine culture was positive for e coli and was given  nitrofurantion at urgent care visit. Attempted to reach patient. No answer at this time.

## 2018-07-19 DIAGNOSIS — Z0000 Encounter for general adult medical examination without abnormal findings: Secondary | ICD-10-CM | POA: Diagnosis not present

## 2018-07-19 DIAGNOSIS — Z1389 Encounter for screening for other disorder: Secondary | ICD-10-CM | POA: Diagnosis not present

## 2018-07-19 DIAGNOSIS — M029 Reactive arthropathy, unspecified: Secondary | ICD-10-CM | POA: Diagnosis not present

## 2018-07-19 DIAGNOSIS — L732 Hidradenitis suppurativa: Secondary | ICD-10-CM | POA: Diagnosis not present

## 2018-07-25 DIAGNOSIS — M459 Ankylosing spondylitis of unspecified sites in spine: Secondary | ICD-10-CM | POA: Diagnosis not present

## 2018-08-04 DIAGNOSIS — M4690 Unspecified inflammatory spondylopathy, site unspecified: Secondary | ICD-10-CM | POA: Diagnosis not present

## 2018-08-04 DIAGNOSIS — M064 Inflammatory polyarthropathy: Secondary | ICD-10-CM | POA: Diagnosis not present

## 2018-08-04 DIAGNOSIS — M461 Sacroiliitis, not elsewhere classified: Secondary | ICD-10-CM | POA: Diagnosis not present

## 2018-09-06 DIAGNOSIS — Z23 Encounter for immunization: Secondary | ICD-10-CM | POA: Diagnosis not present

## 2018-09-27 DIAGNOSIS — M459 Ankylosing spondylitis of unspecified sites in spine: Secondary | ICD-10-CM | POA: Diagnosis not present

## 2018-11-22 DIAGNOSIS — M459 Ankylosing spondylitis of unspecified sites in spine: Secondary | ICD-10-CM | POA: Diagnosis not present

## 2018-11-22 DIAGNOSIS — M064 Inflammatory polyarthropathy: Secondary | ICD-10-CM | POA: Diagnosis not present

## 2019-07-07 ENCOUNTER — Other Ambulatory Visit: Payer: Self-pay

## 2019-07-07 ENCOUNTER — Ambulatory Visit (HOSPITAL_COMMUNITY)
Admission: EM | Admit: 2019-07-07 | Discharge: 2019-07-07 | Disposition: A | Discharge status: Home / Self Care | Payer: 59 | Attending: Physician Assistant | Admitting: Physician Assistant

## 2019-07-07 ENCOUNTER — Encounter (HOSPITAL_COMMUNITY): Payer: Self-pay

## 2019-07-07 DIAGNOSIS — R0789 Other chest pain: Secondary | ICD-10-CM

## 2019-07-07 MED ORDER — IBUPROFEN 800 MG PO TABS: 800.0000 mg | ORAL_TABLET | Freq: Three times a day (TID) | ORAL | 0 refills | Status: DC

## 2019-07-07 MED ORDER — PREDNISONE 10 MG PO TABS: 40.0000 mg | ORAL_TABLET | Freq: Every day | ORAL | 0 refills | Status: AC

## 2019-07-07 NOTE — ED Provider Notes (Signed)
[facility]    CSN: [id number] Arrival date & time: 07/07/19  1138      History   Chief Complaint Chief Complaint  Patient presents with  . Chest Pain    HPI Elizabeth Burke is a 34 y.o. female.   Patient reports to urgent care today for 5 days of left sided chest pain. She reports a 1 year history of similar pain. But this episode has been more persistent.  She reports a constant sharp chest pain just lateral to her sternum. She reports deep breathing makes this worse, movement makes this worse and laying flat makes this worse. She reports at its worst it is a 10/10. She has taken ibuprofen for this in the past with good effect and currently taking it, which brings the pain to a 5/10. She endorses warmth in the area at times. She also reports pain with touch at times. She also reports some pain in her back at the same level at times.   She states at times she feels short of breath with the pain and states the pain prevents her from taking deep breaths. She denies cough but state she has "hacked up some blood in her mucous" a few times. She states she is a smoker.  She denies nausea or sweating with the pain. It does not radiate to her left arm or jaw. She does report a history of syncope but that has not occurred with this pain and she does no associate the two. She has a history of arthritis for which she is treated with remicade infusions every 8 weeks.      Past Medical History:  Diagnosis Date  . Allergy    Seasonal  . Arthritis    Spondyloarthropathy--followed by Dr Dierdre Forth  . Death of child    tearful today , reports 56 mo old son succumbed to SIDS on 12-29-17  . GERD (gastroesophageal reflux disease) 2000 or so  . H/O hiatal hernia   . Headache(784.0)   . Neuromuscular disorder (HCC)    Pins and needles in right arm following surgery for hidradenitis.  . Orthostatic hypotension 09/20/2017  . Scoliosis Dx2006  . Syncope 09/20/2017  . Urinary tract infection      Patient Active Problem List   Diagnosis Date Noted  . Hidradenitis axillaris 01/25/2018  . Preterm premature rupture of membranes (PPROM) with onset of labor within 24 hours of rupture in third trimester, antepartum 10/09/2017  . Syncope 09/20/2017  . Orthostatic hypotension 09/20/2017  . Hidradenitis 07/31/2013  . Post-operative hemorrhage 07/31/2013  . Hidradenitis suppurativa 07/06/2013    Past Surgical History:  Procedure Laterality Date  . APPLICATION OF WOUND VAC Left 01/25/2018   Procedure: APPLICATION OF WOUND VAC;  Surgeon: Abigail Miyamoto, MD;  Location: [facility];  Service: General;  Laterality: Left;  . FRACTURE SURGERY Left 2000   Left wrist--casted only, no surgery  . HYDRADENITIS EXCISION Bilateral 07/31/2013   Procedure: EXCISION HYDRADENITIS RIGHT AXILLA  AND LEFT GROIN ;  Surgeon: Shelly Rubenstein, MD;  Location[facility];  Service: General;  Laterality: Bilateral;  . HYDRADENITIS EXCISION Left 12/05/2017   Procedure: WIDE EXCISION HIDRADENITIS LEFT AXILLA ERAS PATHWAY;  Surgeon: Abigail Miyamoto, MD;  Location: [facility];  Service: General;  Laterality: Left;  . HYDRADENITIS EXCISION Left 01/25/2018   Procedure: LEFT AXILLARY WOUND EXPLORATION ERAS PATHWAY;  Surgeon: Abigail Miyamoto, MD;  Location: [facility];  Service: General;  Laterality: Left;  . LAPAROSCOPIC TUBAL LIGATION Bilateral  12/08/2017   Procedure: LAPAROSCOPIC TUBAL LIGATION with Filshie clips;  Surgeon: Richarda Overlie, MD;  Location: [facility];  Service: Gynecology;  Laterality: Bilateral;  . NOSE SURGERY N/A 2012   removal of fish hook     OB History    Gravida  3   Para  2   Term  1   Preterm  1   AB  1   Living  2     SAB  1   TAB      Ectopic      Multiple  0   Live Births  2            Home Medications    Prior to Admission medications   Medication Sig Start Date End Date Taking? Authorizing Provider  ibuprofen (ADVIL) 800 MG tablet Take 1 tablet (800 mg  total) by mouth 3 (three) times daily. 07/07/19   Earlie Arciga, Veryl Speak, PA-C  nitrofurantoin, macrocrystal-monohydrate, (MACROBID) 100 MG capsule Take 1 capsule (100 mg total) by mouth 2 (two) times daily. 06/24/18   Wurst, [place], PA-C  phenazopyridine (PYRIDIUM) 200 MG tablet Take 1 tablet (200 mg total) by mouth 3 (three) times daily. 06/24/18   Wurst, [place], PA-C  predniSONE (DELTASONE) 10 MG tablet Take 4 tablets (40 mg total) by mouth daily for 5 days. 07/07/19 07/12/19  Denys Salinger, Veryl Speak, PA-C    Family History Family History  Problem Relation Age of Onset  . Cancer Maternal Grandmother        bladder and liver  . Diabetes Maternal Grandfather   . Cancer Maternal Grandfather        prostate  . Diabetes Paternal Grandmother   . Hypertension Mother   . Sleep apnea Father   . Ovarian cysts Sister        polycystic ovarian disease  . Arthritis Paternal Grandfather        unknown joint problem    Social History Social History   Tobacco Use  . Smoking status: Former Smoker    Packs/day: 0.00    Years: 0.00    Pack years: 0.00    Start date: 07/05/2004  . Smokeless tobacco: Never Used  Substance Use Topics  . Alcohol use: No    Alcohol/week: 0.0 standard drinks  . Drug use: Not Currently    Types: Marijuana     Allergies   Patient has no known allergies.   Review of Systems Review of Systems  Constitutional: Negative for chills and fever.  HENT: Negative for sore throat.   Eyes: Negative for pain and visual disturbance.  Respiratory: Positive for chest tightness and shortness of breath. Negative for cough.   Cardiovascular: Positive for chest pain. Negative for palpitations.  Gastrointestinal: Negative for abdominal pain, constipation, nausea and vomiting.  Genitourinary: Negative for dysuria and hematuria.  Musculoskeletal: Positive for back pain. Negative for arthralgias, myalgias, neck pain and neck stiffness.  Skin: Negative for color change and rash.  Neurological:  Negative for seizures, syncope and headaches.  Hematological: Negative for adenopathy.  All other systems reviewed and are negative.    Physical Exam Triage Vital Signs ED Triage Vitals [07/07/19 1226]  Enc Vitals Group     BP 136/83     Pulse Rate 80     Resp 16     Temp 98.1 F (36.7 C)     Temp Source Oral     SpO2 100 %     Weight      Height  Head Circumference      Peak Flow      Pain Score 8     Pain Loc      Pain Edu?      Excl. in GC?    No data found.  Updated Vital Signs BP 136/83 (BP Location: Right Arm)   Pulse 80   Temp 98.1 F (36.7 C) (Oral)   Resp 16   LMP 07/07/2019   SpO2 100%   Visual Acuity Right Eye Distance:   Left Eye Distance:   Bilateral Distance:    Right Eye Near:   Left Eye Near:    Bilateral Near:     Physical Exam Vitals and nursing note reviewed.  Constitutional:      General: She is not in acute distress.    Appearance: She is well-developed. She is not ill-appearing.  HENT:     Head: Normocephalic and atraumatic.  Eyes:     Conjunctiva/sclera: Conjunctivae normal.     Pupils: Pupils are equal, round, and reactive to light.  Cardiovascular:     Rate and Rhythm: Normal rate and regular rhythm.     Heart sounds: Normal heart sounds. Heart sounds not distant. No murmur. No systolic murmur. No diastolic murmur. No friction rub. No S3 or S4 sounds.   Pulmonary:     Effort: Pulmonary effort is normal. No respiratory distress.     Breath sounds: Normal breath sounds.  Chest:     Chest wall: Tenderness (over left midchest, with some TTP along sternocostal junction) present. No mass, deformity, crepitus or edema.  Abdominal:     Palpations: Abdomen is soft.     Tenderness: There is no abdominal tenderness.  Musculoskeletal:        General: Normal range of motion.     Cervical back: Neck supple.     Right lower leg: No edema.     Left lower leg: No edema.  Skin:    General: Skin is warm and dry.  Neurological:      General: No focal deficit present.     Mental Status: She is alert and oriented to person, place, and time.  Psychiatric:        Mood and Affect: Mood normal.        Behavior: Behavior normal.      UC Treatments / Results  Labs (all labs ordered are listed, but only abnormal results are displayed) Labs Reviewed - No data to display  EKG NSR, no ST elevation  Radiology No results found.  Procedures Procedures (including critical care time)  Medications Ordered in UC Medications - No data to display  Initial Impression / Assessment and Plan / UC Course  I have reviewed the triage vital signs and the nursing notes.  Pertinent labs & imaging results that were available during my care of the patient were reviewed by me and considered in my medical decision making (see chart for details).     #chest wall pain - Believe this to be more related to joint or muscular skeletal as opposed to PE or pericarditis. Discussed options for treating with NSAIDs intitially and switching to prednisone if she is not having improvement. Instructed to follow up with PCP if not having improvement. Strict ED precautions discussed.    Final Clinical Impressions(s) / UC Diagnoses   Final diagnoses:  Chest wall pain     Discharge Instructions     I have sent in prescription for ibuprofen 800mg  tablets and prednisone  I would  like you to try scheduled 800mg  ibuprofen 3 times a day for 2 days. If this is not improving your symptoms, you can begin taking the prednisone. This is a 5 day course.  If this does not resolve the pain, I want you to follow up with your primary care provider.  If your chest pain and shortness of breath worsen or you continue to cough up blood, go to the Emergency Department to be further evaluated.      ED Prescriptions    Medication Sig Dispense Auth. Provider   ibuprofen (ADVIL) 800 MG tablet Take 1 tablet (800 mg total) by mouth 3 (three) times daily. 21 tablet  Cylan Borum, Marguerita Beards, PA-C   predniSONE (DELTASONE) 10 MG tablet Take 4 tablets (40 mg total) by mouth daily for 5 days. 20 tablet Annahi Short, Marguerita Beards, PA-C     PDMP not reviewed this encounter.   Purnell Shoemaker, PA-C 07/07/19 1332

## 2019-07-07 NOTE — Discharge Instructions (Addendum)
I have sent in prescription for ibuprofen 800mg  tablets and prednisone  I would like you to try scheduled 800mg  ibuprofen 3 times a day for 2 days. If this is not improving your symptoms, you can begin taking the prednisone. This is a 5 day course.  If this does not resolve the pain, I want you to follow up with your primary care provider.  If your chest pain and shortness of breath worsen or you continue to cough up blood, go to the Emergency Department to be further evaluated.

## 2019-07-07 NOTE — ED Triage Notes (Addendum)
Pt C/O chest pain, symptoms started about 5 days ago. She states the any type of movement makes her chest hurt even more.

## 2019-07-10 ENCOUNTER — Telehealth: Payer: Self-pay | Admitting: Cardiovascular Disease

## 2019-07-10 NOTE — Telephone Encounter (Signed)
New Message  Pt c/o of Chest Pain: STAT if CP now or developed within 24 hours  1. Are you having CP right now? Yes  2. Are you experiencing any other symptoms (ex. SOB, nausea, vomiting, sweating)? SOB, chest tightness,   3. How long have you been experiencing CP? 1 week  4. Is your CP continuous or coming and going? Continuous  5. Have you taken Nitroglycerin? no ?   Scheduled patient with Joni Reining on 07/12/19 at 9:30 am.

## 2019-07-10 NOTE — Telephone Encounter (Signed)
Advised patient, verbalized understanding  

## 2019-07-10 NOTE — Telephone Encounter (Signed)
Spoke with pt and has noted chest pain off and on for years but with this episode pt feels the pain is constant Pt went to urgent care on 07/06/19 and was evaluated and treated for muscular and joint pain Pt was given script for 800 mg tid of Ibuprofen and Prednisone 40 mg daily for 5 days Per instructions was to take Ibuprofen for a couple of days and if no improvement the to start Prednisone Pt has not started Prednisone and will start this today and keep appt for day after tomorrow.Will forward to Dr Duke Salvia for review .Zack Seal

## 2019-07-10 NOTE — Telephone Encounter (Signed)
Keep follow up with [facility] as scheduled.  Unlikely to be coronary related.

## 2019-07-12 ENCOUNTER — Encounter: Payer: Self-pay | Admitting: Physician Assistant

## 2019-07-12 ENCOUNTER — Other Ambulatory Visit: Payer: Self-pay

## 2019-07-12 ENCOUNTER — Ambulatory Visit: Payer: 59 | Admitting: Adult Health

## 2019-07-12 ENCOUNTER — Ambulatory Visit: Payer: 59 | Admitting: Physician Assistant

## 2019-07-12 VITALS — BP 118/86 | HR 71 | Temp 97.0°F | Ht 67.75 in | Wt 210.0 lb

## 2019-07-12 DIAGNOSIS — R55 Syncope and collapse: Secondary | ICD-10-CM

## 2019-07-12 DIAGNOSIS — R0789 Other chest pain: Secondary | ICD-10-CM | POA: Diagnosis not present

## 2019-07-12 NOTE — Progress Notes (Signed)
Cardiology Office Note:    Date:  07/14/2019   ID:  Elizabeth Burke, DOB [birth year], MRN [id number]  PCP:  Gaspar Garbe, MD  Cardiologist:  Chilton Si, MD  Electrophysiologist:  None   Referring MD: Gaspar Garbe, MD   Chief Complaint  Patient presents with  . Follow-up    seen for Dr. Duke Salvia    History of Present Illness:    Elizabeth Burke is a 34 y.o. female with a hx of reactive arthritis with spondyloarthropathy, prior tobacco abuse, GERD, orthostatic hypotension and syncope.  Previous syncope occurred during pregnancy and that there was a possible component of POTS disorder as well. This was treated with increased salt and fluid intake. Symptom was felt to be related to her pregnancy.  Echocardiogram obtained on 11/01/2017 showed EF 50 to 55%, no regional wall motion abnormality.  She was last seen by Theodore Demark, PA-C on 12/22/2017 at which time she was doing well, has not had any further syncope.   More recently, patient presented to the ED on 07/07/2019 with 5-day onset of left-sided chest pain.  Patient was found to have musculoskeletal pain.  She was prescribed ibuprofen 800 mg and prednisone.  Patient presents today for cardiology office visit.  Since the beginning of the sharp left-sided chest pain until today, this has persisted without stop for the past 7 days.  EKG showed elevated J-point in V4 through V6 likely due to early repolarization.  There is no ST depression in anterolateral leads to suggest ACS.  Her symptom is also inconsistent with ACS given the persistent chest pain for 7 days.  She noticed the chest pain more when she palpates the area and when she rotated the body, this is more consistent with musculoskeletal pain.  I recommended finish 2 weeks course of ibuprofen 600 mg 3 times daily and a 5-day course of prednisone.  I will bring her back in 2 to 3 weeks for reassessment.  At this time, I plan to hold off on further cardiac  work-up.    Past Medical History:  Diagnosis Date  . Allergy    Seasonal  . Arthritis    Spondyloarthropathy--followed by Dr Dierdre Forth  . Death of child    tearful today , reports 29 mo old son succumbed to SIDS on 12-29-17  . GERD (gastroesophageal reflux disease) 2000 or so  . H/O hiatal hernia   . Headache(784.0)   . Neuromuscular disorder (HCC)    Pins and needles in right arm following surgery for hidradenitis.  . Orthostatic hypotension 09/20/2017  . Scoliosis Dx2006  . Syncope 09/20/2017  . Urinary tract infection     Past Surgical History:  Procedure Laterality Date  . APPLICATION OF WOUND VAC Left 01/25/2018   Procedure: APPLICATION OF WOUND VAC;  Surgeon: Abigail Miyamoto, MD;  Location: [facility];  Service: General;  Laterality: Left;  . FRACTURE SURGERY Left 2000   Left wrist--casted only, no surgery  . HYDRADENITIS EXCISION Bilateral 07/31/2013   Procedure: EXCISION HYDRADENITIS RIGHT AXILLA  AND LEFT GROIN ;  Surgeon: Shelly Rubenstein, MD;  Location: [facility];  Service: General;  Laterality: Bilateral;  . HYDRADENITIS EXCISION Left 12/05/2017   Procedure: WIDE EXCISION HIDRADENITIS LEFT AXILLA ERAS PATHWAY;  Surgeon: Abigail Miyamoto, MD;  Location: [facility];  Service: General;  Laterality: Left;  . HYDRADENITIS EXCISION Left 01/25/2018   Procedure: LEFT AXILLARY WOUND EXPLORATION ERAS PATHWAY;  Surgeon: Abigail Miyamoto, MD;  Location: [facility];  Service:  General;  Laterality: Left;  . LAPAROSCOPIC TUBAL LIGATION Bilateral 12/08/2017   Procedure: LAPAROSCOPIC TUBAL LIGATION with [place] clips;  Surgeon: Molli Posey, MD;  Location: [facility];  Service: Gynecology;  Laterality: Bilateral;  . NOSE SURGERY N/A 2012   removal of fish hook     Current Medications: Current Meds  Medication Sig  . ibuprofen (ADVIL) 800 MG tablet Take 1 tablet (800 mg total) by mouth 3 (three) times daily.  Marland Kitchen inFLIXimab (REMICADE) 100 MG injection Inject 100 mg into the vein  every 8 (eight) weeks.  . [EXPIRED] predniSONE (DELTASONE) 10 MG tablet Take 4 tablets (40 mg total) by mouth daily for 5 days.     Allergies:   Patient has no known allergies.   Social History   Socioeconomic History  . Marital status: Single    Spouse name: Not on file  . Number of children: 1  . Years of education: 8  . Highest education level: Not on file  Occupational History  . 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  Tobacco Use  . Smoking status: Former Smoker    Packs/day: 0.00    Years: 0.00    Pack years: 0.00    Start date: 07/05/2004  . Smokeless tobacco: Never Used  Substance and Sexual Activity  . Alcohol use: No    Alcohol/week: 0.0 standard drinks  . Drug use: Not Currently    Types: Marijuana  . Sexual activity: Yes    Birth control/protection: None  Other Topics Concern  . Not on file  Social History Narrative   Originally from [place] at home with son.   Social Determinants of Health   Financial Resource Strain:   . Difficulty of Paying Living Expenses: Not on file  Food Insecurity:   . Worried About Charity fundraiser in the Last Year: Not on file  . Ran Out of Food in the Last Year: Not on file  Transportation Needs:   . Lack of Transportation (Medical): Not on file  . Lack of Transportation (Non-Medical): Not on file  Physical Activity:   . Days of Exercise per Week: Not on file  . Minutes of Exercise per Session: Not on file  Stress:   . Feeling of Stress : Not on file  Social Connections:   . Frequency of Communication with Friends and Family: Not on file  . Frequency of Social Gatherings with Friends and Family: Not on file  . Attends Religious Services: Not on file  . Active Member of Clubs or Organizations: Not on file  . Attends Archivist Meetings: Not on file  . Marital Status: Not on file     Family History: The patient's family history includes Arthritis in her  paternal grandfather; Cancer in her maternal grandfather and maternal grandmother; Diabetes in her maternal grandfather and paternal grandmother; Hypertension in her mother; Ovarian cysts in her sister; Sleep apnea in her father.  ROS:   Please see the history of present illness.     All other systems reviewed and are negative.  EKGs/Labs/Other Studies Reviewed:    The following studies were reviewed today:  Echo 10/05/2017 LV EF: 50% -   55% Study Conclusions  - Left ventricle: Mid and basal inferior wall hypokinesis. The   cavity size was mildly dilated. Systolic function was normal. The   estimated ejection fraction was in the range of 50% to 55%. Wall   motion was normal; there were no regional wall  motion   abnormalities. Left ventricular diastolic function parameters   were normal. - Atrial septum: No defect or patent foramen ovale was identified.    EKG:  EKG is ordered today.  The ekg ordered today demonstrates normal sinus rhythm, elevated J-point without contralateral ST depression.  This is likely early repolarization.  Recent Labs: No results found for requested labs within last 8760 hours.  Recent Lipid Panel No results found for: CHOL, TRIG, HDL, CHOLHDL, VLDL, LDLCALC, LDLDIRECT  Physical Exam:    VS:  BP 118/86   Pulse 71   Temp (!) 97 F (36.1 C) (Temporal)   Ht 5' 7.75" (1.721 m)   Wt 210 lb (95.3 kg)   LMP 07/07/2019   BMI 32.17 kg/m     Wt Readings from Last 3 Encounters:  07/12/19 210 lb (95.3 kg)  06/24/18 206 lb (93.4 kg)  01/25/18 196 lb (88.9 kg)     GEN:  Well nourished, well developed in no acute distress HEENT: Normal NECK: No JVD; No carotid bruits LYMPHATICS: No lymphadenopathy CARDIAC: RRR, no murmurs, rubs, gallops RESPIRATORY:  Clear to auscultation without rales, wheezing or rhonchi  ABDOMEN: Soft, non-tender, non-distended MUSCULOSKELETAL:  No edema; No deformity  SKIN: Warm and dry NEUROLOGIC:  Alert and oriented x  3 PSYCHIATRIC:  Normal affect   ASSESSMENT:    1. Atypical chest pain   2. Syncope and collapse    PLAN:    In order of problems listed above:  1. Atypical chest pain: Patient has been having persistent chest pain without stop for 7 days.  Symptom is more consistent with musculoskeletal pain.  There is no EKG changes to suggest pericarditis.  She does not appear ill enough for this to be myocarditis.  I recommended 2 weeks of ibuprofen and finish the current course of prednisone.  2. History of syncope: No recurrence   Medication Adjustments/Labs and Tests Ordered: Current medicines are reviewed at length with the patient today.  Concerns regarding medicines are outlined above.  Orders Placed This Encounter  Procedures  . EKG 12-Lead   No orders of the defined types were placed in this encounter.   Patient Instructions  Medication Instructions:  You can take Ibuprofen 600mg  three times a day for 2 weeks  *If you need a refill on your cardiac medications before your next appointment, please call your pharmacy*  Lab Work: None  If you have labs (blood work) drawn today and your tests are completely normal, you will receive your results only by: MyChart Message (if you have MyChart) OR . A paper copy in the mail If you have any lab test that is abnormal or we need to change your treatment, we will call you to review the results.  Testing/Procedures: None   Follow-Up: At [facility], you and your health needs are our priority.  As part of our continuing mission to provide you with exceptional heart care, we have created designated Provider Care Teams.  These Care Teams include your primary Cardiologist (physician) and Advanced Practice Providers (APPs -  Physician Assistants and Nurse Practitioners) who all work together to provide you with the care you need, when you need it.  Your next appointment:   2 week(s)  The format for your next appointment:   Virtual Visit    Provider:   [facility], PA-C  Other Instructions     Signed, Azalee Course, Azalee Course  07/14/2019 10:32 PM    [facility]

## 2019-07-12 NOTE — Patient Instructions (Signed)
Medication Instructions:  You can take Ibuprofen 600mg  three times a day for 2 weeks  *If you need a refill on your cardiac medications before your next appointment, please call your pharmacy*  Lab Work: None  If you have labs (blood work) drawn today and your tests are completely normal, you will receive your results only by: MyChart Message (if you have MyChart) OR . A paper copy in the mail If you have any lab test that is abnormal or we need to change your treatment, we will call you to review the results.  Testing/Procedures: None   Follow-Up: At [facility], you and your health needs are our priority.  As part of our continuing mission to provide you with exceptional heart care, we have created designated Provider Care Teams.  These Care Teams include your primary Cardiologist (physician) and Advanced Practice Providers (APPs -  Physician Assistants and Nurse Practitioners) who all work together to provide you with the care you need, when you need it.  Your next appointment:   2 week(s)  The format for your next appointment:   Virtual Visit   Provider:   [facility], PA-C  Other Instructions

## 2019-07-14 ENCOUNTER — Encounter: Payer: Self-pay | Admitting: Physician Assistant

## 2019-07-30 ENCOUNTER — Telehealth (INDEPENDENT_AMBULATORY_CARE_PROVIDER_SITE_OTHER): Payer: 59 | Admitting: Physician Assistant

## 2019-07-30 ENCOUNTER — Encounter: Payer: Self-pay | Admitting: Physician Assistant

## 2019-07-30 ENCOUNTER — Telehealth: Payer: Self-pay

## 2019-07-30 VITALS — Ht 67.0 in | Wt 210.0 lb

## 2019-07-30 DIAGNOSIS — R42 Dizziness and giddiness: Secondary | ICD-10-CM

## 2019-07-30 DIAGNOSIS — R55 Syncope and collapse: Secondary | ICD-10-CM

## 2019-07-30 DIAGNOSIS — R0789 Other chest pain: Secondary | ICD-10-CM

## 2019-07-30 NOTE — Patient Instructions (Addendum)
Medication Instructions:  Your physician recommends that you continue on your current medications as directed. Please refer to the Current Medication list given to you today.  *If you need a refill on your cardiac medications before your next appointment, please call your pharmacy*  Lab Work: NONE ordered at this time of appointment   If you have labs (blood work) drawn today and your tests are completely normal, you will receive your results only by: Marland Kitchen MyChart Message (if you have MyChart) OR . A paper copy in the mail If you have any lab test that is abnormal or we need to change your treatment, we will call you to review the results.  Testing/Procedures: NONE  Follow-Up: At [facility], you and your health needs are our priority.  As part of our continuing mission to provide you with exceptional heart care, we have created designated Provider Care Teams.  These Care Teams include your primary Cardiologist (physician) and Advanced Practice Providers (APPs -  Physician Assistants and Nurse Practitioners) who all work together to provide you with the care you need, when you need it.  Your next appointment:   9-12 month(s)  The format for your next appointment:   Either In Person or Virtual  Provider:   Chilton Si, MD  Other Instructions  Increase fluid and salt intake  Monitor your blood pressure daily, if your Systolic (Top) number of your blood pressure if less than 100, drink more fluids, eat a saltine cracker before getting up

## 2019-07-30 NOTE — Telephone Encounter (Signed)
Virtual Visit Pre-Appointment Phone Call  "(Name), I am calling you today to discuss your upcoming appointment. We are currently trying to limit exposure to the virus that causes COVID-19 by seeing patients at home rather than in the office."  1. "What is the BEST phone number to call the day of the visit?" - include this in appointment notes  2. "Do you have or have access to (through a family member/friend) a smartphone with video capability that we can use for your visit?" a. If yes - list this number in appt notes as "cell" (if different from BEST phone #) and list the appointment type as a VIDEO visit in appointment notes b. If no - list the appointment type as a PHONE visit in appointment notes  3. Confirm consent - "In the setting of the current Covid19 crisis, you are scheduled for a (phone or video) visit with your provider on (date) at (time).  Just as we do with many in-office visits, in order for you to participate in this visit, we must obtain consent.  If you'd like, I can send this to your mychart (if signed up) or email for you to review.  Otherwise, I can obtain your verbal consent now.  All virtual visits are billed to your insurance company just like a normal visit would be.  By agreeing to a virtual visit, we'd like you to understand that the technology does not allow for your provider to perform an examination, and thus may limit your provider's ability to fully assess your condition. If your provider identifies any concerns that need to be evaluated in person, we will make arrangements to do so.  Finally, though the technology is pretty good, we cannot assure that it will always work on either your or our end, and in the setting of a video visit, we may have to convert it to a phone-only visit.  In either situation, we cannot ensure that we have a secure connection.  Are you willing to proceed?" STAFF: Did the patient verbally acknowledge consent to telehealth visit? Document  YES/NO here: YES  4. Advise patient to be prepared - "Two hours prior to your appointment, go ahead and check your blood pressure, pulse, oxygen saturation, and your weight (if you have the equipment to check those) and write them all down. When your visit starts, your provider will ask you for this information. If you have an Apple Watch or Kardia device, please plan to have heart rate information ready on the day of your appointment. Please have a pen and paper handy nearby the day of the visit as well."  5. Give patient instructions for MyChart download to smartphone OR Doximity/Doxy.me as below if video visit (depending on what platform provider is using)  6. Inform patient they will receive a phone call 15 minutes prior to their appointment time (may be from unknown caller ID) so they should be prepared to answer    TELEPHONE CALL NOTE  Elizabeth Burke has been deemed a candidate for a follow-up tele-health visit to limit community exposure during the Covid-19 pandemic. I spoke with the patient via phone to ensure availability of phone/video source, confirm preferred email & phone number, and discuss instructions and expectations.  I reminded Elizabeth Burke to be prepared with any vital sign and/or heart rhythm information that could potentially be obtained via home monitoring, at the time of her visit. I reminded Elizabeth Burke to expect a phone call prior to  her visit.  [name], CMA 07/30/2019 8:33 AM   INSTRUCTIONS FOR DOWNLOADING THE MYCHART APP TO SMARTPHONE  - The patient must first make sure to have activated MyChart and know their login information - If Apple, go to [employer] and type in MyChart in the search bar and download the app. If Android, ask patient to go to [employer] and type in [place] in the search bar and download the app. The app is free but as with any other app downloads, their phone may require them to verify saved payment information or  Apple/Android password.  - The patient will need to then log into the app with their MyChart username and password, and select [facility] as their healthcare provider to link the account. When it is time for your visit, go to the MyChart app, find appointments, and click Begin Video Visit. Be sure to Select Allow for your device to access the Microphone and Camera for your visit. You will then be connected, and your provider will be with you shortly.  **If they have any issues connecting, or need assistance please contact MyChart service desk [phone])**  **If using a computer, in order to ensure the best quality for their visit they will need to use either of the following Internet Browsers: [employer] or Google Chrome**  IF USING DOXIMITY or DOXY.ME - The patient will receive a link just prior to their visit by text.     FULL LENGTH CONSENT FOR TELE-HEALTH VISIT   I hereby voluntarily request, consent and authorize [facility] and its employed or contracted physicians, physician assistants, nurse practitioners or other licensed health care professionals (the Practitioner), to provide me with telemedicine health care services (the "Services") as deemed necessary by the treating Practitioner. I acknowledge and consent to receive the Services by the Practitioner via telemedicine. I understand that the telemedicine visit will involve communicating with the Practitioner through live audiovisual communication technology and the disclosure of certain medical information by electronic transmission. I acknowledge that I have been given the opportunity to request an in-person assessment or other available alternative prior to the telemedicine visit and am voluntarily participating in the telemedicine visit.  I understand that I have the right to withhold or withdraw my consent to the use of telemedicine in the course of my care at any time, without affecting my right to future care  or treatment, and that the Practitioner or I may terminate the telemedicine visit at any time. I understand that I have the right to inspect all information obtained and/or recorded in the course of the telemedicine visit and may receive copies of available information for a reasonable fee.  I understand that some of the potential risks of receiving the Services via telemedicine include:  Marland Kitchen Delay or interruption in medical evaluation due to technological equipment failure or disruption; . Information transmitted may not be sufficient (e.g. poor resolution of images) to allow for appropriate medical decision making by the Practitioner; and/or  . In rare instances, security protocols could fail, causing a breach of personal health information.  Furthermore, I acknowledge that it is my responsibility to provide information about my medical history, conditions and care that is complete and accurate to the best of my ability. I acknowledge that Practitioner's advice, recommendations, and/or decision may be based on factors not within their control, such as incomplete or inaccurate data provided by me or distortions of diagnostic images or specimens that may result from electronic transmissions. I  understand that the practice of medicine is not an exact science and that Practitioner makes no warranties or guarantees regarding treatment outcomes. I acknowledge that I will receive a copy of this consent concurrently upon execution via email to the email address I last provided but may also request a printed copy by calling the office of [facility].    I understand that my insurance will be billed for this visit.   I have read or had this consent read to me. . I understand the contents of this consent, which adequately explains the benefits and risks of the Services being provided via telemedicine.  . I have been provided ample opportunity to ask questions regarding this consent and the Services and have had  my questions answered to my satisfaction. . I give my informed consent for the services to be provided through the use of telemedicine in my medical care  By participating in this telemedicine visit I agree to the above.

## 2019-07-30 NOTE — Progress Notes (Signed)
Virtual Visit via Telephone Note   This visit type was conducted due to national recommendations for restrictions regarding the COVID-19 Pandemic (e.g. social distancing) in an effort to limit this patient's exposure and mitigate transmission in our community.  Due to her co-morbid illnesses, this patient is at least at moderate risk for complications without adequate follow up.  This format is felt to be most appropriate for this patient at this time.  The patient did not have access to video technology/had technical difficulties with video requiring transitioning to audio format only (telephone).  All issues noted in this document were discussed and addressed.  No physical exam could be performed with this format.  Please refer to the patient's chart for her  consent to telehealth for [facility].   Date:  08/01/2019   ID:  MASEY SCHEIBER, DOB 05/14/1986, MRN [id number]  Patient Location: Home Provider Location: Office  PCP:  Gaspar Garbe, MD  Cardiologist:  Chilton Si, MD  Electrophysiologist:  None   Evaluation Performed:  Follow-Up Visit  Chief Complaint:  followup  History of Present Illness:    Elizabeth Burke is a 34 y.o. female with hx of reactive arthritis with spondyloarthropathy, prior tobacco abuse, GERD, orthostatic hypotension and syncope.  Previous syncope occurred during pregnancy and there was a possible component of POTS disorder as well. This was treated with increased salt and fluid intake. Symptom was felt to be related to her pregnancy. Echocardiogram obtained on 11/01/2017 showed EF 50 to 55%, no regional wall motion abnormality. She has not had any more syncope after delivery of her baby.  Unfortunately, her newborn baby passed away the same year.  She currently has a 68 year old child as well.  She was last seen in the ED on 07/07/2019 with 5-day onset of left-sided chest pain which was felt to be musculoskeletal pain.  I last saw the patient on  07/12/2019, I recommended finish 2 weeks course of ibuprofen 600 mg 3 times daily dosing and 5-day course of prednisone.    Patient presents today for virtual visit via D[employer] app.  She denies any further chest pain.  She says as soon as she finished a 5-day course of prednisone prior to the last visit, her chest pain was gone.  It has not recurred again.  The only issue she is still experience is occasional lightheadedness when she change body positions.  I suspect this is still related to her history of POTS disorder.  I recommended increase salt intake and fluid intake in this case.  I also urged her to obtain blood pressure frequently and if her systolic blood pressures less than 100 mmHg, she has to be very careful getting up.  Otherwise she denies any shortness of breath.  She is very proud of her 32 year old or is currently trying to learn ninth grade math even though he is in seventh grade.   The patient does not have symptoms concerning for COVID-19 infection (fever, chills, cough, or new shortness of breath).    Past Medical History:  Diagnosis Date  . Allergy    Seasonal  . Arthritis    Spondyloarthropathy--followed by Dr Dierdre Forth  . Death of child    tearful today , reports 6 mo old son succumbed to SIDS on 12-29-17  . GERD (gastroesophageal reflux disease) 2000 or so  . H/O hiatal hernia   . Headache(784.0)   . Neuromuscular disorder (HCC)    Pins and needles in right arm following surgery for  hidradenitis.  . Orthostatic hypotension 09/20/2017  . Scoliosis Dx2006  . Syncope 09/20/2017  . Urinary tract infection    Past Surgical History:  Procedure Laterality Date  . APPLICATION OF WOUND VAC Left 01/25/2018   Procedure: APPLICATION OF WOUND VAC;  Surgeon: Coralie Keens, MD;  Location: [facility];  Service: General;  Laterality: Left;  . FRACTURE SURGERY Left 2000   Left wrist--casted only, no surgery  . HYDRADENITIS EXCISION Bilateral 07/31/2013   Procedure: EXCISION  HYDRADENITIS RIGHT AXILLA  AND LEFT GROIN ;  Surgeon: Harl Bowie, MD;  Location: [place];  Service: General;  Laterality: Bilateral;  . HYDRADENITIS EXCISION Left 12/05/2017   Procedure: WIDE EXCISION HIDRADENITIS LEFT AXILLA ERAS PATHWAY;  Surgeon: Coralie Keens, MD;  Location: [place];  Service: General;  Laterality: Left;  . HYDRADENITIS EXCISION Left 01/25/2018   Procedure: LEFT AXILLARY WOUND EXPLORATION ERAS PATHWAY;  Surgeon: Coralie Keens, MD;  Location: [facility];  Service: General;  Laterality: Left;  . LAPAROSCOPIC TUBAL LIGATION Bilateral 12/08/2017   Procedure: LAPAROSCOPIC TUBAL LIGATION with Filshie clips;  Surgeon: Molli Posey, MD;  Location: [facility];  Service: Gynecology;  Laterality: Bilateral;  . NOSE SURGERY N/A 2012   removal of fish hook      Current Meds  Medication Sig  . hydrOXYzine (ATARAX/VISTARIL) 10 MG tablet   . ibuprofen (ADVIL) 800 MG tablet Take 1 tablet (800 mg total) by mouth 3 (three) times daily.  Marland Kitchen inFLIXimab (REMICADE) 100 MG injection Inject 100 mg into the vein every 8 (eight) weeks.     Allergies:   Patient has no known allergies.   Social History   Tobacco Use  . Smoking status: Former Smoker    Packs/day: 0.00    Years: 0.00    Pack years: 0.00    Start date: 07/05/2004  . Smokeless tobacco: Never Used  Substance Use Topics  . Alcohol use: No    Alcohol/week: 0.0 standard drinks  . Drug use: Not Currently    Types: Marijuana     Family Hx: The patient's family history includes Arthritis in her paternal grandfather; Cancer in her maternal grandfather and maternal grandmother; Diabetes in her maternal grandfather and paternal grandmother; Hypertension in her mother; Ovarian cysts in her sister; Sleep apnea in her father.  ROS:   Please see the history of present illness.     All other systems reviewed and are negative.   Prior CV studies:   The following studies were reviewed today:  Echo 10/05/2017 LV  EF: 50% -   55%  ------------------------------------------------------------------- Indications:      (R55).  ------------------------------------------------------------------- History:   PMH:  Acquired from the patient and from the patient&'s chart.  Syncope.  Risk factors:  Former tobacco use.  ------------------------------------------------------------------- Study Conclusions  - Left ventricle: Mid and basal inferior wall hypokinesis. The   cavity size was mildly dilated. Systolic function was normal. The   estimated ejection fraction was in the range of 50% to 55%. Wall   motion was normal; there were no regional wall motion   abnormalities. Left ventricular diastolic function parameters   were normal. - Atrial septum: No defect or patent foramen ovale was identified.   Labs/Other Tests and Data Reviewed:    EKG:  An ECG dated 07/12/2019 was personally reviewed today and demonstrated:  Normal sinus rhythm without significant ST-T wave changes.  Recent Labs: No results found for requested labs within last 8760 hours.   Recent Lipid Panel No results  found for: CHOL, TRIG, HDL, CHOLHDL, LDLCALC, LDLDIRECT  Wt Readings from Last 3 Encounters:  07/30/19 210 lb (95.3 kg)  07/12/19 210 lb (95.3 kg)  06/24/18 206 lb (93.4 kg)     Objective:    Vital Signs:  Ht 5\' 7"  (1.702 m)   Wt 210 lb (95.3 kg)   LMP 07/07/2019   BMI 32.89 kg/m    VITAL SIGNS:  reviewed  ASSESSMENT & PLAN:    1. History of syncope: She has not had another passing out spell in the last 2 years after delayed delivery of her baby.  Unfortunately her baby passed away in 2017-10-21.  She currently lives with her 48 year old child.  2. Dizziness: Her dizziness seems to be orthostatic in nature.  She only noticed symptom when she gets up too quickly.  She has a history of POTS like disorder, however has never been truly diagnosed with POTS before.  I recommend a more conservative management with increased  salt intake and fluid intake.  3. Atypical chest pain: Symptom completely resolved.  Felt to be more musculoskeletal pain.  No ischemic work-up needed at this time.  COVID-19 Education: The signs and symptoms of COVID-19 were discussed with the patient and how to seek care for testing (follow up with PCP or arrange E-visit).  The importance of social distancing was discussed today.  Time:   Today, I have spent 8 minutes with the patient with telehealth technology discussing the above problems.     Medication Adjustments/Labs and Tests Ordered: Current medicines are reviewed at length with the patient today.  Concerns regarding medicines are outlined above.   Tests Ordered: No orders of the defined types were placed in this encounter.   Medication Changes: No orders of the defined types were placed in this encounter.   Follow Up:  Either In Person or Virtual in 6 month(s)  Signed, 14, Azalee Course  08/01/2019 8:29 AM    [facility]

## 2019-09-05 ENCOUNTER — Encounter (HOSPITAL_BASED_OUTPATIENT_CLINIC_OR_DEPARTMENT_OTHER): Payer: Self-pay

## 2019-09-05 ENCOUNTER — Emergency Department (HOSPITAL_BASED_OUTPATIENT_CLINIC_OR_DEPARTMENT_OTHER): Payer: 59

## 2019-09-05 ENCOUNTER — Emergency Department (HOSPITAL_BASED_OUTPATIENT_CLINIC_OR_DEPARTMENT_OTHER)
Admission: EM | Admit: 2019-09-05 | Discharge: 2019-09-05 | Disposition: A | Discharge status: Home / Self Care | Payer: 59 | Attending: Emergency Medicine | Admitting: Emergency Medicine

## 2019-09-05 ENCOUNTER — Other Ambulatory Visit: Payer: Self-pay

## 2019-09-05 DIAGNOSIS — R0602 Shortness of breath: Secondary | ICD-10-CM | POA: Diagnosis not present

## 2019-09-05 DIAGNOSIS — Z87891 Personal history of nicotine dependence: Secondary | ICD-10-CM | POA: Insufficient documentation

## 2019-09-05 DIAGNOSIS — R0789 Other chest pain: Secondary | ICD-10-CM | POA: Diagnosis present

## 2019-09-05 DIAGNOSIS — Z79899 Other long term (current) drug therapy: Secondary | ICD-10-CM | POA: Insufficient documentation

## 2019-09-05 DIAGNOSIS — R42 Dizziness and giddiness: Secondary | ICD-10-CM | POA: Diagnosis not present

## 2019-09-05 LAB — CBC WITH DIFFERENTIAL/PLATELET
Abs Immature Granulocytes: 0.02 10*3/uL (ref 0.00–0.07)
Basophils Absolute: 0 10*3/uL (ref 0.0–0.1)
Basophils Relative: 1 %
Eosinophils Absolute: 0.2 10*3/uL (ref 0.0–0.5)
Eosinophils Relative: 2 %
HCT: 40.4 % (ref 36.0–46.0)
Hemoglobin: 12.2 g/dL (ref 12.0–15.0)
Immature Granulocytes: 0 %
Lymphocytes Relative: 15 %
Lymphs Abs: 1.3 10*3/uL (ref 0.7–4.0)
MCH: 24.8 pg — ABNORMAL LOW (ref 26.0–34.0)
MCHC: 30.2 g/dL (ref 30.0–36.0)
MCV: 82.1 fL (ref 80.0–100.0)
Monocytes Absolute: 0.6 10*3/uL (ref 0.1–1.0)
Monocytes Relative: 7 %
Neutro Abs: 6.5 10*3/uL (ref 1.7–7.7)
Neutrophils Relative %: 75 %
Platelets: 448 10*3/uL — ABNORMAL HIGH (ref 150–400)
RBC: 4.92 MIL/uL (ref 3.87–5.11)
RDW: 13.8 % (ref 11.5–15.5)
WBC: 8.5 10*3/uL (ref 4.0–10.5)
nRBC: 0 % (ref 0.0–0.2)

## 2019-09-05 LAB — BASIC METABOLIC PANEL
Anion gap: 10 (ref 5–15)
BUN: 7 mg/dL (ref 6–20)
CO2: 27 mmol/L (ref 22–32)
Calcium: 9.2 mg/dL (ref 8.9–10.3)
Chloride: 102 mmol/L (ref 98–111)
Creatinine, Ser: 0.66 mg/dL (ref 0.44–1.00)
GFR calc Af Amer: >60 mL/min (ref >60)
GFR calc non Af Amer: >60 mL/min (ref >60)
Glucose, Bld: 97 mg/dL (ref 70–99)
Potassium: 3.9 mmol/L (ref 3.5–5.1)
Sodium: 139 mmol/L (ref 135–145)

## 2019-09-05 LAB — PREGNANCY, URINE: Preg Test, Ur: NEGATIVE

## 2019-09-05 LAB — TROPONIN I (HIGH SENSITIVITY): Troponin I (High Sensitivity): <2 ng/L (ref <18)

## 2019-09-05 MED ORDER — PREDNISONE 10 MG PO TABS: 20.0000 mg | ORAL_TABLET | Freq: Two times a day (BID) | ORAL | 0 refills | Status: AC

## 2019-09-05 MED ORDER — DEXAMETHASONE SODIUM PHOSPHATE 10 MG/ML IJ SOLN
10.0000 mg | Freq: Once | INTRAMUSCULAR | Status: AC
  Administered 2019-09-05: 10 mg via INTRAVENOUS
  Filled 2019-09-05: qty 1

## 2019-09-05 MED ORDER — LIDOCAINE 4 % EX CREA: 1.0000 "application " | TOPICAL_CREAM | Freq: Three times a day (TID) | CUTANEOUS | 0 refills | Status: DC | PRN

## 2019-09-05 MED ORDER — SODIUM CHLORIDE 0.9 % IV BOLUS
1000.0000 mL | Freq: Once | INTRAVENOUS | Status: AC
  Administered 2019-09-05: 10:00:00 1000 mL via INTRAVENOUS

## 2019-09-05 MED FILL — predniSONE 10 MG TABS: 10 | 4 days supply | Qty: 16 | Fill #0

## 2019-09-05 NOTE — ED Provider Notes (Signed)
[facility] EMERGENCY DEPARTMENT Provider Note   CSN: [id number] Arrival date & time: 09/05/19  4098     History Chief Complaint  Patient presents with  . Chest Pain     Elizabeth Burke is a 34 y.o. female with history of orthostatic hypotension, neuromuscular disorder, spondyloarthropathy, hidradenitis suppurativa presents for evaluation of acute onset, constant left-sided chest pains for 4 days.  Reports that the pain is sharp, worsens with laying flat and leaning forward.  She had improvement taking ibuprofen on the first day of her symptoms but since then reports it has been less effective.  Today noticed some shortness of breath when she awoke.  She denies fever, abdominal pain, nausea, vomiting, diaphoresis, or syncope.  She has had some mild lightheadedness but states this is not unusual for her.  She denies recent travel or surgeries, hemoptysis, prior history of DVT or PE, leg swelling, or hormone ablation therapy.  She is a former smoker, quit a few months ago.  Denies recreational drug use.  No significant family history of heart disease at a young age.  She has had similar pains previously, reports they typically occur a couple of days before she is due for her Remicade infusion which she gets every 8 weeks.  She is scheduled for her next Remicade infusion next week.  She is followed by a cardiologist, reports that she was recently seen virtually last month for similar symptoms and had improvement with a course of prednisone.    The history is provided by the patient.       Past Medical History:  Diagnosis Date  . Allergy    Seasonal  . Arthritis    Spondyloarthropathy--followed by Dr Dierdre Forth  . Death of child    tearful today , reports 89 mo old son succumbed to SIDS on 12-29-17  . GERD (gastroesophageal reflux disease) 2000 or so  . H/O hiatal hernia   . Headache(784.0)   . Neuromuscular disorder (HCC)    Pins and needles in right arm following surgery for  hidradenitis.  . Orthostatic hypotension 09/20/2017  . Scoliosis Dx2006  . Syncope 09/20/2017  . Urinary tract infection     Patient Active Problem List   Diagnosis Date Noted  . Hidradenitis axillaris 01/25/2018  . Preterm premature rupture of membranes (PPROM) with onset of labor within 24 hours of rupture in third trimester, antepartum 10/09/2017  . Syncope 09/20/2017  . Orthostatic hypotension 09/20/2017  . Hidradenitis 07/31/2013  . Post-operative hemorrhage 07/31/2013  . Hidradenitis suppurativa 07/06/2013    Past Surgical History:  Procedure Laterality Date  . APPLICATION OF WOUND VAC Left 01/25/2018   Procedure: APPLICATION OF WOUND VAC;  Surgeon: Abigail Miyamoto, MD;  Location: [facility];  Service: General;  Laterality: Left;  . FRACTURE SURGERY Left 2000   Left wrist--casted only, no surgery  . HYDRADENITIS EXCISION Bilateral 07/31/2013   Procedure: EXCISION HYDRADENITIS RIGHT AXILLA  AND LEFT GROIN ;  Surgeon: Shelly Rubenstein, MD;  Location: [facility];  Service: General;  Laterality: Bilateral;  . HYDRADENITIS EXCISION Left 12/05/2017   Procedure: WIDE EXCISION HIDRADENITIS LEFT AXILLA ERAS PATHWAY;  Surgeon: Abigail Miyamoto, MD;  [facility];  Service: General;  Laterality: Left;  . HYDRADENITIS EXCISION Left 01/25/2018   Procedure: LEFT AXILLARY WOUND EXPLORATION ERAS PATHWAY;  Surgeon: Abigail Miyamoto, MD;  Location: [facility];  Service: General;  Laterality: Left;  . LAPAROSCOPIC TUBAL LIGATION Bilateral 12/08/2017   Procedure: LAPAROSCOPIC TUBAL LIGATION with Filshie clips;  Surgeon: Richarda Overlie, MD;  Location: [facility];  Service: Gynecology;  Laterality: Bilateral;  . NOSE SURGERY N/A 2012   removal of fish hook      OB History    Gravida  3   Para  2   Term  1   Preterm  1   AB  1   Living  2     SAB  1   TAB      Ectopic      Multiple  0   Live Births  2           Family History  Problem Relation Age of Onset  .  Cancer Maternal Grandmother        bladder and liver  . Diabetes Maternal Grandfather   . Cancer Maternal Grandfather        prostate  . Diabetes Paternal Grandmother   . Hypertension Mother   . Sleep apnea Father   . Ovarian cysts Sister        polycystic ovarian disease  . Arthritis Paternal Grandfather        unknown joint problem    Social History   Tobacco Use  . Smoking status: Former Smoker    Packs/day: 0.00    Years: 0.00    Pack years: 0.00    Start date: 07/05/2004  . Smokeless tobacco: Never Used  Substance Use Topics  . Alcohol use: No    Alcohol/week: 0.0 standard drinks  . Drug use: Not Currently    Types: Marijuana    Home Medications Prior to Admission medications   Medication Sig Start Date End Date Taking? Authorizing Provider  hydrOXYzine (ATARAX/VISTARIL) 10 MG tablet  07/25/19   [provider]  ibuprofen (ADVIL) 800 MG tablet Take 1 tablet (800 mg total) by mouth 3 (three) times daily. 07/07/19   Darr, Veryl Speak, PA-C  inFLIXimab (REMICADE) 100 MG injection Inject 100 mg into the vein every 8 (eight) weeks.    [provider]  lidocaine (LMX) 4 % cream Apply 1 application topically 3 (three) times daily as needed. 09/05/19   Aison Malveaux A, PA-C  predniSONE (DELTASONE) 10 MG tablet Take 2 tablets (20 mg total) by mouth 2 (two) times daily with a meal for 4 days. 09/05/19 09/09/19  Michela Pitcher A, PA-C    Allergies    Patient has no known allergies.  Review of Systems   Review of Systems  Constitutional: Negative for chills, diaphoresis and fever.  Respiratory: Positive for shortness of breath. Negative for cough.   Cardiovascular: Positive for chest pain. Negative for palpitations and leg swelling.  Gastrointestinal: Negative for abdominal pain, nausea and vomiting.  Neurological: Positive for light-headedness. Negative for syncope.  All other systems reviewed and are negative.   Physical Exam Updated Vital Signs BP (!) 140/95 (BP  Location: Right Arm)   Pulse 94   Temp 98.4 F (36.9 C) (Oral)   Resp 20   Ht 5\' 7"  (1.702 m)   Wt 94.3 kg   LMP 09/05/2019   SpO2 100%   BMI 32.58 kg/m   Physical Exam Vitals and nursing note reviewed.  Constitutional:      General: She is not in acute distress.    Appearance: She is well-developed.  HENT:     Head: Normocephalic and atraumatic.  Eyes:     General:        Right eye: No discharge.        Left eye: No discharge.  Conjunctiva/sclera: Conjunctivae normal.  Neck:     Vascular: No JVD.     Trachea: No tracheal deviation.  Cardiovascular:     Rate and Rhythm: Normal rate and regular rhythm.     Pulses:          Radial pulses are 2+ on the right side and 2+ on the left side.       Dorsalis pedis pulses are 2+ on the right side and 2+ on the left side.       Posterior tibial pulses are 2+ on the right side and 2+ on the left side.     Comments: Homans sign absent bilaterally, no lower extremity edema, no palpable cords, compartments are soft  Pulmonary:     Effort: Pulmonary effort is normal.  Chest:     Chest wall: Tenderness present.       Comments: Focal chest wall tenderness to palpation along the left anterior parasternal region.  No deformity, crepitus, ecchymosis, or flail segment. Abdominal:     General: There is no distension.     Palpations: Abdomen is soft.     Tenderness: There is no abdominal tenderness.  Musculoskeletal:     Right lower leg: No edema.     Left lower leg: No tenderness. No edema.  Skin:    General: Skin is warm and dry.     Findings: No erythema.  Neurological:     Mental Status: She is alert.  Psychiatric:        Behavior: Behavior normal.     ED Results / Procedures / Treatments   Labs (all labs ordered are listed, but only abnormal results are displayed) Labs Reviewed  CBC WITH DIFFERENTIAL/PLATELET - Abnormal; Notable for the following components:      Result Value   MCH 24.8 (*)    Platelets 448 (*)     All other components within normal limits  BASIC METABOLIC PANEL  PREGNANCY, URINE  TROPONIN I (HIGH SENSITIVITY)    EKG EKG Interpretation  Date/Time:  Wednesday September 05 2019 08:33:15 EST Ventricular Rate:  97 PR Interval:    QRS Duration: 84 QT Interval:  344 QTC Calculation: 437 R Axis:   76 Text Interpretation: Sinus rhythm No significant change since last tracing Confirmed by Gwyneth Sprout ([id number]) on 09/05/2019 9:08:38 AM   Radiology DG Chest 2 View  Result Date: 09/05/2019 CLINICAL DATA:  Chest pain for 4 days. EXAM: CHEST - 2 VIEW COMPARISON:  06/22/2015. FINDINGS: Trachea is midline. Heart size normal. Lungs are clear. No pleural fluid. IMPRESSION: No acute findings. Electronically Signed   By: Leanna Battles M.D.   On: 09/05/2019 09:53    Procedures Procedures (including critical care time)  Medications Ordered in ED Medications  dexamethasone (DECADRON) injection 10 mg (10 mg Intravenous Given 09/05/19 0951)  sodium chloride 0.9 % bolus 1,000 mL (0 mLs Intravenous Stopped 09/05/19 1130)    ED Course  I have reviewed the triage vital signs and the nursing notes.  Pertinent labs & imaging results that were available during my care of the patient were reviewed by me and considered in my medical decision making (see chart for details).    MDM Rules/Calculators/A&P                      Patient presenting for evaluation of 4-day history of left-sided chest pains which have been constant.  The pain is positional and reproducible on palpation.  It is not pleuritic or exertional.  She is afebrile, initially hypertensive and mildly tachycardic with improvement on subsequent reevaluation's.  She overall appears well and is resting comfortably.  She has had similar symptoms previously and was diagnosed with musculoskeletal chest pain.  Her EKG in the ED shows normal sinus rhythm, no acute ischemic abnormalities.  She is PERC negative and I doubt PE.  Her chest x-ray shows no  acute cardiopulmonary abnormalities including cardiomegaly, pleural effusion, or pneumonia.  No evidence of pneumothorax.  Lab work reviewed and interpreted by me shows no leukocytosis, no anemia, no metabolic derangements, no renal insufficiency.  Her troponin is negative and since her symptoms have been constant and are atypical I do not feel that she requires serial troponins.  Doubt dissection, cardiac tamponade, esophageal rupture.  Abdomen soft and nontender.  She was given a dose of Decadron in the ED and on reevaluation reports that she is feeling much better.  She will call her cardiologist to schedule follow-up appointment.  Will discharge with course of prednisone.  She will keep her appointment for her Remicade infusion and will contact her rheumatologist for further recommendations.  Suspect today her pain is musculoskeletal in etiology.  Discussed strict ED return precautions. Patient verbalized understanding of and agreement with plan and is safe for discharge home at this time.    Final Clinical Impression(s) / ED Diagnoses Final diagnoses:  Acute chest wall pain    Rx / DC Orders ED Discharge Orders         Ordered    predniSONE (DELTASONE) 10 MG tablet  2 times daily with meals     09/05/19 1132    lidocaine (LMX) 4 % cream  3 times daily PRN     09/05/19 1141           Renita Papa, PA-C 09/05/19 1333    Blanchie Dessert, MD 09/05/19 1511

## 2019-09-05 NOTE — Discharge Instructions (Addendum)
Start taking prednisone beginning tomorrow. Do not take ibuprofen, advil, aleve, or motrin while taking prednisone. You can take 500-1000mg  every 6 hours as needed for pain. Can also apply lidocaine patches or cream to the chest wall. Can also apply ice or heat for 20 minutes at a time as needed.   Call cardiologist and rheumatologist for re-evaluation. Return to the ED if any concerning signs or symptoms develop.

## 2019-09-05 NOTE — ED Notes (Signed)
Pt states has RA and hurts pretty much all the time but some times are better, states having chest pain aching , it is time for her infusions and she aches more

## 2019-09-05 NOTE — ED Triage Notes (Signed)
Pt arrives ambulatory to ED With reports of CP X 4 days. Pt states that she does have a cardiologist who she sees. Pt states "I usually have pain before my infusions". Pt reports she gets infusions for a "RA type of condition".

## 2019-10-07 ENCOUNTER — Encounter (HOSPITAL_COMMUNITY): Payer: Self-pay

## 2019-10-07 ENCOUNTER — Other Ambulatory Visit: Payer: Self-pay

## 2019-10-07 ENCOUNTER — Ambulatory Visit (HOSPITAL_COMMUNITY)
Admission: EM | Admit: 2019-10-07 | Discharge: 2019-10-07 | Disposition: A | Discharge status: Home / Self Care | Payer: 59 | Attending: Family Medicine | Admitting: Family Medicine

## 2019-10-07 DIAGNOSIS — L02219 Cutaneous abscess of trunk, unspecified: Secondary | ICD-10-CM

## 2019-10-07 MED ORDER — FLUCONAZOLE 150 MG PO TABS: ORAL_TABLET | ORAL | 0 refills | Status: DC

## 2019-10-07 MED ORDER — DOXYCYCLINE HYCLATE 100 MG PO CAPS: 100.0000 mg | ORAL_CAPSULE | Freq: Two times a day (BID) | ORAL | 0 refills | Status: DC

## 2019-10-07 NOTE — Discharge Instructions (Addendum)
You are experiencing an abscess.  I have sent in doxycyline 100mg  for you to take twice daily for 7 days.  I have also sent in diflucan for you to take in the case that you develop a yeast infection from the antibiotics.   Follow up of you are not improving by Tuesday with our office or with primary care.

## 2019-10-07 NOTE — ED Provider Notes (Signed)
[facility]    CSN: [id number] Arrival date & time: 10/07/19  1005      History   Chief Complaint Chief Complaint  Patient presents with  . Recurrent Skin Infections    HPI Elizabeth Burke is a 34 y.o. female.   Patient reports that she gets boils on her skin very often.  Around 3-5 within a month.She currently c/o boil to chest at mid sternum and a second boil to R breast. They appear to be rubbing one another. She reports that she has been using antibacterial soap with no improvement. She states that the areas usually just disappear on their own. Denies ever having to take medications for boils. Reports that the boil on her chest has been there for almost 3 mos, but the one under her breast has been there about a week. C/o pain with the boil under her breast. Denies HA, ST, SOB, body aches, chills, n/v/d, fever, rash, other symptoms.  ROS per HPI  The history is provided by the patient.    Past Medical History:  Diagnosis Date  . Allergy    Seasonal  . Arthritis    Spondyloarthropathy--followed by Dr Dierdre Forth  . Death of child    tearful today , reports 27 mo old son succumbed to SIDS on 12-29-17  . GERD (gastroesophageal reflux disease) 2000 or so  . H/O hiatal hernia   . Headache(784.0)   . Neuromuscular disorder (HCC)    Pins and needles in right arm following surgery for hidradenitis.  . Orthostatic hypotension 09/20/2017  . Scoliosis Dx2006  . Syncope 09/20/2017  . Urinary tract infection     Patient Active Problem List   Diagnosis Date Noted  . Hidradenitis axillaris 01/25/2018  . Preterm premature rupture of membranes (PPROM) with onset of labor within 24 hours of rupture in third trimester, antepartum 10/09/2017  . Syncope 09/20/2017  . Orthostatic hypotension 09/20/2017  . Hidradenitis 07/31/2013  . Post-operative hemorrhage 07/31/2013  . Hidradenitis suppurativa 07/06/2013    Past Surgical History:  Procedure Laterality Date  . APPLICATION  OF WOUND VAC Left 01/25/2018   Procedure: APPLICATION OF WOUND VAC;  Surgeon: Abigail Miyamoto, MD;  Location: [facility];  Service: General;  Laterality: Left;  . FRACTURE SURGERY Left 2000   Left wrist--casted only, no surgery  . HYDRADENITIS EXCISION Bilateral 07/31/2013   Procedure: EXCISION HYDRADENITIS RIGHT AXILLA  AND LEFT GROIN ;  Surgeon: Shelly Rubenstein, MD;  Location: [facility];  Service: General;  Laterality: Bilateral;  . HYDRADENITIS EXCISION Left 12/05/2017   Procedure: WIDE EXCISION HIDRADENITIS LEFT AXILLA ERAS PATHWAY;  Surgeon: Abigail Miyamoto, MD;  Location: [facility];  Service: General;  Laterality: Left;  . HYDRADENITIS EXCISION Left 01/25/2018   Procedure: LEFT AXILLARY WOUND EXPLORATION ERAS PATHWAY;  Surgeon: Abigail Miyamoto, MD;  Location: [facility];  Service: General;  Laterality: Left;  . LAPAROSCOPIC TUBAL LIGATION Bilateral 12/08/2017   Procedure: LAPAROSCOPIC TUBAL LIGATION with Filshie clips;  Surgeon: Richarda Overlie, MD;  Location: [facility];  Service: Gynecology;  Laterality: Bilateral;  . NOSE SURGERY N/A 2012   removal of fish hook     OB History    Gravida  3   Para  2   Term  1   Preterm  1   AB  1   Living  2     SAB  1   TAB      Ectopic      Multiple  0  Live Births  2            Home Medications    Prior to Admission medications   Medication Sig Start Date End Date Taking? Authorizing Provider  doxycycline (VIBRAMYCIN) 100 MG capsule Take 1 capsule (100 mg total) by mouth 2 (two) times daily. 10/07/19   Faustino Congress, NP  fluconazole (DIFLUCAN) 150 MG tablet Take one tablet at the onset of symptoms, if still having symptoms in 3 days, take the second tablet. 10/07/19   Faustino Congress, NP  hydrOXYzine (ATARAX/VISTARIL) 10 MG tablet  07/25/19   [provider]  ibuprofen (ADVIL) 800 MG tablet Take 1 tablet (800 mg total) by mouth 3 (three) times daily. 07/07/19   Darr, Marguerita Beards, PA-C  inFLIXimab  (REMICADE) 100 MG injection Inject 100 mg into the vein every 8 (eight) weeks.    [provider]  lidocaine (LMX) 4 % cream Apply 1 application topically 3 (three) times daily as needed. 09/05/19   Renita Papa, PA-C    Family History Family History  Problem Relation Age of Onset  . Cancer Maternal Grandmother        bladder and liver  . Diabetes Maternal Grandfather   . Cancer Maternal Grandfather        prostate  . Diabetes Paternal Grandmother   . Hypertension Mother   . Sleep apnea Father   . Ovarian cysts Sister        polycystic ovarian disease  . Arthritis Paternal Grandfather        unknown joint problem    Social History Social History   Tobacco Use  . Smoking status: Former Smoker    Packs/day: 0.00    Years: 0.00    Pack years: 0.00    Start date: 07/05/2004  . Smokeless tobacco: Never Used  Substance Use Topics  . Alcohol use: No    Alcohol/week: 0.0 standard drinks  . Drug use: Not Currently    Types: Marijuana     Allergies   Patient has no known allergies.   Review of Systems Review of Systems   Physical Exam Triage Vital Signs ED Triage Vitals  Enc Vitals Group     BP 10/07/19 1023 (!) 134/101     Pulse Rate 10/07/19 1023 (!) 103     Resp 10/07/19 1023 16     Temp 10/07/19 1023 98.4 F (36.9 C)     Temp Source 10/07/19 1023 Oral     SpO2 10/07/19 1023 100 %     Weight --      Height --      Head Circumference --      Peak Flow --      Pain Score 10/07/19 1022 7     Pain Loc --      Pain Edu? --      Excl. in [place]? --    No data found.  Updated Vital Signs BP (!) 134/101 (BP Location: Right Arm)   Pulse (!) 103   Temp 98.4 F (36.9 C) (Oral)   Resp 16   LMP 10/02/2019 (Exact Date)   SpO2 100%   Visual Acuity Right Eye Distance:   Left Eye Distance:   Bilateral Distance:    Right Eye Near:   Left Eye Near:    Bilateral Near:     Physical Exam Vitals and nursing note reviewed.  Constitutional:      General:  She is not in acute distress.    Appearance: Normal  appearance. She is well-developed and normal weight. She is not ill-appearing, toxic-appearing or diaphoretic.  HENT:     Head: Normocephalic and atraumatic.     Right Ear: Tympanic membrane normal.     Left Ear: Tympanic membrane normal.     Nose: Nose normal.     Mouth/Throat:     Mouth: Mucous membranes are moist.     Pharynx: Oropharynx is clear.  Eyes:     Extraocular Movements: Extraocular movements intact.     Conjunctiva/sclera: Conjunctivae normal.     Pupils: Pupils are equal, round, and reactive to light.  Neck:     Vascular: No carotid bruit.  Cardiovascular:     Rate and Rhythm: Normal rate and regular rhythm.     Heart sounds: Normal heart sounds. No murmur.  Pulmonary:     Effort: Pulmonary effort is normal. No respiratory distress.     Breath sounds: Normal breath sounds.  Abdominal:     General: Bowel sounds are normal. There is no distension.     Palpations: Abdomen is soft. There is no mass.     Tenderness: There is no abdominal tenderness. There is no guarding or rebound.     Hernia: No hernia is present.  Musculoskeletal:        General: Normal range of motion.     Cervical back: Normal range of motion and neck supple. No rigidity or tenderness.  Lymphadenopathy:     Cervical: No cervical adenopathy.  Skin:    General: Skin is warm and dry.     Capillary Refill: Capillary refill takes less than 2 seconds.     Findings: Abscess present. No lesion.          Comments: Areas of boils. On sternum, 1 x 1 cm raised area, TTP. Under right breast, 2 x 2 cm area that is warm, erythematous, indurated, TTP  Neurological:     General: No focal deficit present.     Mental Status: She is alert and oriented to person, place, and time.  Psychiatric:        Mood and Affect: Mood normal.        Behavior: Behavior normal.        Thought Content: Thought content normal.      UC Treatments / Results  Labs (all labs  ordered are listed, but only abnormal results are displayed) Labs Reviewed - No data to display  EKG   Radiology No results found.  Procedures Procedures (including critical care time)  Medications Ordered in UC Medications - No data to display  Initial Impression / Assessment and Plan / UC Course  I have reviewed the triage vital signs and the nursing notes.  Pertinent labs & imaging results that were available during my care of the patient were reviewed by me and considered in my medical decision making (see chart for details).     Cutaneous abscess: To sternum and under R breast, medially. Abscess under R breast is erythematous, warm, indurated, very TTP. Abscess to sternum is raised, warm, mildly TTP. Prescribed doxycycline 100mg  BID x 7 days. Also sent in Diflucan 150 mg, instructed pt to take one tablet at the onset of symptoms and if she is still experiencing symptoms 3 days later, to take the second tablet.  Instructed that if she is not seeing any improvement over the next 4 2 to 3 days, to follow-up with primary care with our office.  Patient instructed to follow-up with the ER for trouble swallowing,  trouble breathing, other concerning symptoms. Final Clinical Impressions(s) / UC Diagnoses   Final diagnoses:  Cutaneous abscess of trunk, unspecified site of trunk     Discharge Instructions     You are experiencing an abscess.  I have sent in doxycyline 100mg  for you to take twice daily for 7 days.  I have also sent in diflucan for you to take in the case that you develop a yeast infection from the antibiotics.   Follow up of you are not improving by Tuesday with our office or with primary care.    ED Prescriptions    Medication Sig Dispense Auth. Provider   doxycycline (VIBRAMYCIN) 100 MG capsule Take 1 capsule (100 mg total) by mouth 2 (two) times daily. 14 capsule Sunday, NP   fluconazole (DIFLUCAN) 150 MG tablet Take one tablet at the onset of  symptoms, if still having symptoms in 3 days, take the second tablet. 2 tablet Moshe Cipro, NP     PDMP not reviewed this encounter.   Moshe Cipro, NP 10/08/19 1913

## 2019-10-07 NOTE — ED Triage Notes (Signed)
C/o boil and "knot" under right breast.

## 2020-02-29 ENCOUNTER — Other Ambulatory Visit: Payer: Self-pay | Admitting: Surgery

## 2020-06-05 ENCOUNTER — Telehealth: Payer: Self-pay | Admitting: Cardiovascular Disease

## 2020-06-05 ENCOUNTER — Ambulatory Visit
Admission: EM | Admit: 2020-06-05 | Discharge: 2020-06-05 | Disposition: A | Discharge status: Home / Self Care | Payer: 59 | Attending: Emergency Medicine | Admitting: Emergency Medicine

## 2020-06-05 ENCOUNTER — Other Ambulatory Visit: Payer: Self-pay

## 2020-06-05 DIAGNOSIS — R0789 Other chest pain: Secondary | ICD-10-CM

## 2020-06-05 MED ORDER — IBUPROFEN 800 MG PO TABS: 800.0000 mg | ORAL_TABLET | Freq: Three times a day (TID) | ORAL | 0 refills | Status: DC

## 2020-06-05 NOTE — ED Triage Notes (Signed)
Pt c/o lt side sharp stabbing chest pain for months that has worsen in the past week. States has connective tissue disease and states has had 4 EKGs this year. States her cardiologist ssid its muscular pain. States sometimes its hard to breath from the pain.

## 2020-06-05 NOTE — Telephone Encounter (Signed)
Spoke with pt she states that she has been having intermittent chest pain for "months now" and tired of going to the ER and getting nowhere. She states that this started yesterday and has been constant now for 2 days.  She has connective tissue disease and states that this happen when the time is coming up to have the Remicade infusion. She states that it is almost time for the infusion. Informed pt that she should at lease go to the Urgent care and they can do EKG and testing. Verbalizes understanding. Will go now.

## 2020-06-05 NOTE — Discharge Instructions (Signed)
Use anti-inflammatories for pain/swelling. You may take up to 800 mg Ibuprofen every 8 hours with food. You may supplement Ibuprofen with Tylenol 500-1000 mg every 8 hours.  Gentle stretching Alternate ice and heat Follow up if not improving or worsening 

## 2020-06-05 NOTE — ED Provider Notes (Signed)
[facility]    CSN: [id number] Arrival date & time: 06/05/20  1414      History   Chief Complaint Chief Complaint  Patient presents with  . Chest Pain    HPI Elizabeth Burke is a 34 y.o. female presenting today for evaluation of chest pain.  Patient reports that she has had chest pain on the left side of her chest for months.  Symptoms worsened in the past week.  Has previously been evaluated for similar in the past with negative work-up.  Attributed to MSK etiology.  Does report typical improvement with ibuprofen, but is not taking it recently.  Current episode has been going on for approximately 1 week.  Typically will be intermittent throughout the day, today has been constant.  Symptoms worsen with lying flat.  Denies correlation to eating or associated nausea vomiting or abdominal pain.  Does report underlying GERD.  Denies any recent URI symptoms or cold symptoms.       HPI  Past Medical History:  Diagnosis Date  . Allergy    Seasonal  . Arthritis    Spondyloarthropathy--followed by Dr Dierdre Forth  . Death of child    tearful today , reports 89 mo old son succumbed to SIDS on 12-29-17  . GERD (gastroesophageal reflux disease) 2000 or so  . H/O hiatal hernia   . Headache(784.0)   . Neuromuscular disorder (HCC)    Pins and needles in right arm following surgery for hidradenitis.  . Orthostatic hypotension 09/20/2017  . Scoliosis Dx2006  . Syncope 09/20/2017  . Urinary tract infection     Patient Active Problem List   Diagnosis Date Noted  . Hidradenitis axillaris 01/25/2018  . Preterm premature rupture of membranes (PPROM) with onset of labor within 24 hours of rupture in third trimester, antepartum 10/09/2017  . Syncope 09/20/2017  . Orthostatic hypotension 09/20/2017  . Post-operative hemorrhage 07/31/2013  . Hidradenitis suppurativa 07/06/2013    Past Surgical History:  Procedure Laterality Date  . APPLICATION OF WOUND VAC Left 01/25/2018    Procedure: APPLICATION OF WOUND VAC;  Surgeon: Abigail Miyamoto, MD;  Location: [facility];  Service: General;  Laterality: Left;  . FRACTURE SURGERY Left 2000   Left wrist--casted only, no surgery  . HYDRADENITIS EXCISION Bilateral 07/31/2013   Procedure: EXCISION HYDRADENITIS RIGHT AXILLA  AND LEFT GROIN ;  Surgeon: Shelly Rubenstein, MD;  Location: [facility];  Service: General;  Laterality: Bilateral;  . HYDRADENITIS EXCISION Left 12/05/2017   Procedure: WIDE EXCISION HIDRADENITIS LEFT AXILLA ERAS PATHWAY;  Surgeon: Abigail Miyamoto, MD;  [facility];  Service: General;  Laterality: Left;  . HYDRADENITIS EXCISION Left 01/25/2018   Procedure: LEFT AXILLARY WOUND EXPLORATION ERAS PATHWAY;  Surgeon: Abigail Miyamoto, MD;  Location: [facility];  Service: General;  Laterality: Left;  . LAPAROSCOPIC TUBAL LIGATION Bilateral 12/08/2017   Procedure: LAPAROSCOPIC TUBAL LIGATION with Filshie clips;  Surgeon: Richarda Overlie, MD;  Location: [facility];  Service: Gynecology;  Laterality: Bilateral;  . NOSE SURGERY N/A 2012   removal of fish hook     OB History    Gravida  3   Para  2   Term  1   Preterm  1   AB  1   Living  2     SAB  1   TAB      Ectopic      Multiple  0   Live Births  2  Home Medications    Prior to Admission medications   Medication Sig Start Date End Date Taking? Authorizing Provider  ibuprofen (ADVIL) 800 MG tablet Take 1 tablet (800 mg total) by mouth 3 (three) times daily. 06/05/20   Guage Efferson C, PA-C  inFLIXimab (REMICADE) 100 MG injection Inject 100 mg into the vein every 8 (eight) weeks.    [provider]    Family History Family History  Problem Relation Age of Onset  . Cancer Maternal Grandmother        bladder and liver  . Diabetes Maternal Grandfather   . Cancer Maternal Grandfather        prostate  . Diabetes Paternal Grandmother   . Hypertension Mother   . Sleep apnea Father   . Ovarian cysts  Sister        polycystic ovarian disease  . Arthritis Paternal Grandfather        unknown joint problem    Social History Social History   Tobacco Use  . Smoking status: Former Smoker    Packs/day: 0.00    Years: 0.00    Pack years: 0.00    Start date: 07/05/2004  . Smokeless tobacco: Never Used  Substance Use Topics  . Alcohol use: No    Alcohol/week: 0.0 standard drinks  . Drug use: Not Currently    Types: Marijuana     Allergies   Patient has no known allergies.   Review of Systems Review of Systems  Constitutional: Negative for activity change, appetite change, chills, fatigue and fever.  HENT: Negative for congestion, ear pain, rhinorrhea, sinus pressure, sore throat and trouble swallowing.   Eyes: Negative for discharge and redness.  Respiratory: Negative for cough, chest tightness and shortness of breath.   Cardiovascular: Positive for chest pain. Negative for leg swelling.  Gastrointestinal: Negative for abdominal pain, diarrhea, nausea and vomiting.  Musculoskeletal: Negative for myalgias.  Skin: Negative for rash.  Neurological: Negative for dizziness, light-headedness and headaches.     Physical Exam Triage Vital Signs ED Triage Vitals [06/05/20 1435]  Enc Vitals Group     BP (!) 139/93     Pulse Rate (!) 111     Resp 18     Temp 98.7 F (37.1 C)     Temp Source Oral     SpO2 96 %     Weight      Height      Head Circumference      Peak Flow      Pain Score 9     Pain Loc      Pain Edu?      Excl. in [place]?    No data found.  Updated Vital Signs BP (!) 139/93 (BP Location: Left Arm)   Pulse (!) 111   Temp 98.7 F (37.1 C) (Oral)   Resp 18   LMP 05/06/2020   SpO2 96%   Breastfeeding No   Visual Acuity Right Eye Distance:   Left Eye Distance:   Bilateral Distance:    Right Eye Near:   Left Eye Near:    Bilateral Near:     Physical Exam Vitals and nursing note reviewed.  Constitutional:      Appearance: She is well-developed.      Comments: No acute distress  HENT:     Head: Normocephalic and atraumatic.     Ears:     Comments: Bilateral ears without tenderness to palpation of external auricle, tragus and mastoid, EAC's without erythema or  swelling, TM's with good bony landmarks and cone of light. Non erythematous.     Nose: Nose normal.     Mouth/Throat:     Comments: Oral mucosa pink and moist, no tonsillar enlargement or exudate. Posterior pharynx patent and nonerythematous, no uvula deviation or swelling. Normal phonation. Eyes:     Conjunctiva/sclera: Conjunctivae normal.  Cardiovascular:     Rate and Rhythm: Normal rate.  Pulmonary:     Effort: Pulmonary effort is normal. No respiratory distress.     Comments: Breathing comfortably at rest, CTABL, no wheezing, rales or other adventitious sounds auscultated  Left anterior chest with reproducible tenderness to touch Abdominal:     General: There is no distension.     Comments: Nontender to palpation  Musculoskeletal:        General: Normal range of motion.     Cervical back: Neck supple.     Comments: Bilateral lower legs symmetric without calf tenderness  Skin:    General: Skin is warm and dry.  Neurological:     Mental Status: She is alert and oriented to person, place, and time.      UC Treatments / Results  Labs (all labs ordered are listed, but only abnormal results are displayed) Labs Reviewed - No data to display  EKG   Radiology No results found.  Procedures Procedures (including critical care time)  Medications Ordered in UC Medications - No data to display  Initial Impression / Assessment and Plan / UC Course  I have reviewed the triage vital signs and the nursing notes.  Pertinent labs & imaging results that were available during my care of the patient were reviewed by me and considered in my medical decision making (see chart for details).     EKG normal sinus rhythm, no acute signs of ischemia or infarction, has had  prior chest x-rays which are normal, lungs clear today, symptoms not suggestive of GERD at this time.  Reproducible to touch, suspect most likely MSK etiology and recommending continued use of NSAIDs.  Discussed strict return precautions. Patient verbalized understanding and is agreeable with plan.  Final Clinical Impressions(s) / UC Diagnoses   Final diagnoses:  Chest wall pain     Discharge Instructions     Use anti-inflammatories for pain/swelling. You may take up to 800 mg Ibuprofen every 8 hours with food. You may supplement Ibuprofen with Tylenol [phone] mg every 8 hours.  Gentle stretching Alternate ice and heat Follow up if not improving or worsening    ED Prescriptions    Medication Sig Dispense Auth. Provider   ibuprofen (ADVIL) 800 MG tablet Take 1 tablet (800 mg total) by mouth 3 (three) times daily. 21 tablet Norrin Shreffler, [place] C, PA-C     PDMP not reviewed this encounter.   Lew Dawes, PA-C 06/05/20 1606

## 2020-06-07 NOTE — Telephone Encounter (Signed)
Her chest pain is not ischemic.

## 2020-09-01 ENCOUNTER — Other Ambulatory Visit: Payer: Self-pay

## 2020-09-01 ENCOUNTER — Ambulatory Visit
Admission: EM | Admit: 2020-09-01 | Discharge: 2020-09-01 | Disposition: A | Discharge status: Home / Self Care | Payer: 59 | Attending: Emergency Medicine | Admitting: Emergency Medicine

## 2020-09-01 DIAGNOSIS — T148XXA Other injury of unspecified body region, initial encounter: Secondary | ICD-10-CM

## 2020-09-01 DIAGNOSIS — L089 Local infection of the skin and subcutaneous tissue, unspecified: Secondary | ICD-10-CM | POA: Diagnosis not present

## 2020-09-01 MED ORDER — HYDROXYZINE HCL 25 MG PO TABS: 25.0000 mg | ORAL_TABLET | Freq: Four times a day (QID) | ORAL | 0 refills | Status: AC | PRN

## 2020-09-01 MED ORDER — DOXYCYCLINE HYCLATE 100 MG PO CAPS: 100.0000 mg | ORAL_CAPSULE | Freq: Two times a day (BID) | ORAL | 0 refills | Status: AC

## 2020-09-01 NOTE — ED Triage Notes (Signed)
PT  reports she has an autoimmune disorder and she frequently has skin outbreaks. Now pt reports she has been scratching skin. There may be infection to skin.

## 2020-09-01 NOTE — Discharge Instructions (Signed)
Begin doxycycline twice daily for 10 days Elevate leg Keep wounds clean and dry May try hydroxyzine as needed for itching Follow-up if not improving or worsening

## 2020-09-01 NOTE — ED Provider Notes (Signed)
[facility]    CSN: [id number] Arrival date & time: 09/01/20  1439      History   Chief Complaint Chief Complaint  Patient presents with  . Recurrent Skin Infections    HPI Elizabeth Burke is a 35 y.o. female history of unknown autoimmune disorder, hidradenitis suppurativa presenting today for evaluation of possible wound infection to right lower leg.  Reports that she has been seeing dermatology for dry skin to her right lower leg and some swelling with itching, the recently has developed wounds that are draining.  Reports increased pain and sensitivity around these areas and is concerned about infection.  Reports history of MRSA.  HPI  Past Medical History:  Diagnosis Date  . Allergy    Seasonal  . Arthritis    Spondyloarthropathy--followed by Dr Dierdre Forth  . Death of child    tearful today , reports 44 mo old son succumbed to SIDS on 12-29-17  . GERD (gastroesophageal reflux disease) 2000 or so  . H/O hiatal hernia   . Headache(784.0)   . Neuromuscular disorder (HCC)    Pins and needles in right arm following surgery for hidradenitis.  . Orthostatic hypotension 09/20/2017  . Scoliosis Dx2006  . Syncope 09/20/2017  . Urinary tract infection     Patient Active Problem List   Diagnosis Date Noted  . Hidradenitis axillaris 01/25/2018  . Preterm premature rupture of membranes (PPROM) with onset of labor within 24 hours of rupture in third trimester, antepartum 10/09/2017  . Syncope 09/20/2017  . Orthostatic hypotension 09/20/2017  . Post-operative hemorrhage 07/31/2013  . Hidradenitis suppurativa 07/06/2013    Past Surgical History:  Procedure Laterality Date  . APPLICATION OF WOUND VAC Left 01/25/2018   Procedure: APPLICATION OF WOUND VAC;  Surgeon: Abigail Miyamoto, MD;  Location: [facility];  Service: General;  Laterality: Left;  . FRACTURE SURGERY Left 2000   Left wrist--casted only, no surgery  . HYDRADENITIS EXCISION Bilateral 07/31/2013   Procedure:  EXCISION HYDRADENITIS RIGHT AXILLA  AND LEFT GROIN ;  Surgeon: Shelly Rubenstein, MD;  Location: [facility];  Service: General;  Laterality: Bilateral;  . HYDRADENITIS EXCISION Left 12/05/2017   Procedure: WIDE EXCISION HIDRADENITIS LEFT AXILLA ERAS PATHWAY;  Surgeon: Abigail Miyamoto, MD;  Location: [facility];  Service: General;  Laterality: Left;  . HYDRADENITIS EXCISION Left 01/25/2018   Procedure: LEFT AXILLARY WOUND EXPLORATION ERAS PATHWAY;  Surgeon: Abigail Miyamoto, MD;  Location: [facility];  Service: General;  Laterality: Left;  . LAPAROSCOPIC TUBAL LIGATION Bilateral 12/08/2017   Procedure: LAPAROSCOPIC TUBAL LIGATION with Filshie clips;  Surgeon: Richarda Overlie, MD;  Location: [facility];  Service: Gynecology;  Laterality: Bilateral;  . NOSE SURGERY N/A 2012   removal of fish hook     OB History    Gravida  3   Para  2   Term  1   Preterm  1   AB  1   Living  2     SAB  1   IAB      Ectopic      Multiple  0   Live Births  2            Home Medications    Prior to Admission medications   Medication Sig Start Date End Date Taking? Authorizing Provider  doxycycline (VIBRAMYCIN) 100 MG capsule Take 1 capsule (100 mg total) by mouth 2 (two) times daily for 10 days. 09/01/20 09/11/20 Yes Sharlet Notaro C, PA-C  hydrOXYzine (ATARAX/VISTARIL) 25  MG tablet Take 1 tablet (25 mg total) by mouth every 6 (six) hours as needed for itching. 09/01/20  Yes Kutter Schnepf C, PA-C  ibuprofen (ADVIL) 800 MG tablet Take 1 tablet (800 mg total) by mouth 3 (three) times daily. 06/05/20   Leathia Farnell C, PA-C  inFLIXimab (REMICADE) 100 MG injection Inject 100 mg into the vein every 8 (eight) weeks.    [provider]    Family History Family History  Problem Relation Age of Onset  . Cancer Maternal Grandmother        bladder and liver  . Diabetes Maternal Grandfather   . Cancer Maternal Grandfather        prostate  . Diabetes Paternal Grandmother   .  Hypertension Mother   . Sleep apnea Father   . Ovarian cysts Sister        polycystic ovarian disease  . Arthritis Paternal Grandfather        unknown joint problem    Social History Social History   Tobacco Use  . Smoking status: Former Smoker    Packs/day: 0.00    Years: 0.00    Pack years: 0.00    Start date: 07/05/2004  . Smokeless tobacco: Never Used  Substance Use Topics  . Alcohol use: No    Alcohol/week: 0.0 standard drinks  . Drug use: Not Currently    Types: Marijuana     Allergies   Patient has no known allergies.   Review of Systems Review of Systems  Constitutional: Negative for fatigue and fever.  HENT: Negative for mouth sores.   Eyes: Negative for visual disturbance.  Respiratory: Negative for shortness of breath.   Cardiovascular: Negative for chest pain.  Gastrointestinal: Negative for abdominal pain, nausea and vomiting.  Genitourinary: Negative for genital sores.  Musculoskeletal: Negative for arthralgias and joint swelling.  Skin: Positive for color change and wound. Negative for rash.  Neurological: Negative for dizziness, weakness, light-headedness and headaches.     Physical Exam Triage Vital Signs ED Triage Vitals  Enc Vitals Group     BP 09/01/20 1540 (!) 141/86     Pulse Rate 09/01/20 1540 79     Resp 09/01/20 1540 18     Temp 09/01/20 1540 97.9 F (36.6 C)     Temp Source 09/01/20 1540 Oral     SpO2 09/01/20 1540 98 %     Weight --      Height --      Head Circumference --      Peak Flow --      Pain Score 09/01/20 1547 10     Pain Loc --      Pain Edu? --      Excl. in GC? --    No data found.  Updated Vital Signs BP (!) 141/86 (BP Location: Right Arm)   Pulse 79   Temp 97.9 F (36.6 C) (Oral)   Resp 18   LMP 08/14/2020   SpO2 98%   Visual Acuity Right Eye Distance:   Left Eye Distance:   Bilateral Distance:    Right Eye Near:   Left Eye Near:    Bilateral Near:     Physical Exam Vitals and nursing note  reviewed.  Constitutional:      Appearance: She is well-developed and well-nourished.     Comments: No acute distress  HENT:     Head: Normocephalic and atraumatic.     Nose: Nose normal.  Eyes:     Conjunctiva/sclera:  Conjunctivae normal.  Cardiovascular:     Rate and Rhythm: Normal rate.  Pulmonary:     Effort: Pulmonary effort is normal. No respiratory distress.  Abdominal:     General: There is no distension.  Musculoskeletal:        General: Normal range of motion.     Cervical back: Neck supple.  Skin:    General: Skin is warm and dry.     Comments: Right medial lower leg with dry skin with 2 open wounds with surrounding hyperpigmentation tenderness and mild swelling, draining serosanguineous fluid  Neurological:     Mental Status: She is alert and oriented to person, place, and time.  Psychiatric:        Mood and Affect: Mood and affect normal.        UC Treatments / Results  Labs (all labs ordered are listed, but only abnormal results are displayed) Labs Reviewed - No data to display  EKG   Radiology No results found.  Procedures Procedures (including critical care time)  Medications Ordered in UC Medications - No data to display  Initial Impression / Assessment and Plan / UC Course  I have reviewed the triage vital signs and the nursing notes.  Pertinent labs & imaging results that were available during my care of the patient were reviewed by me and considered in my medical decision making (see chart for details).     Covering for wound infections with doxycycline twice daily for 10 days, discussed elevating legs and continue to follow-up with dermatology as planned.  Wounds may have developed secondary to fluid/swelling.  Has previously tried steroid creams and moisturization efforts without improvement of symptoms.  Discussed strict return precautions. Patient verbalized understanding and is agreeable with plan.  Final Clinical Impressions(s) / UC  Diagnoses   Final diagnoses:  Wound infection     Discharge Instructions     Begin doxycycline twice daily for 10 days Elevate leg Keep wounds clean and dry May try hydroxyzine as needed for itching Follow-up if not improving or worsening    ED Prescriptions    Medication Sig Dispense Auth. Provider   doxycycline (VIBRAMYCIN) 100 MG capsule Take 1 capsule (100 mg total) by mouth 2 (two) times daily for 10 days. 20 capsule Traniyah Hallett C, PA-C   hydrOXYzine (ATARAX/VISTARIL) 25 MG tablet Take 1 tablet (25 mg total) by mouth every 6 (six) hours as needed for itching. 12 tablet Charolett Yarrow, [place] C, PA-C     PDMP not reviewed this encounter.   Lew Dawes, [place] 09/01/20 1649

## 2021-01-06 ENCOUNTER — Telehealth: Payer: Self-pay | Admitting: *Deleted

## 2021-01-06 NOTE — Telephone Encounter (Signed)
Agree, with history of H Pylori and now with severe abdominal pain I would recommend she go to ED.

## 2021-01-06 NOTE — Telephone Encounter (Signed)
Patient called in requesting appt to establish care and was transferred to Triage. States she was treated with amoxicillin for H. Pylori by her PCP, wasn't doing well on it (N/V), but completed course. She is now having so much abdominal pain that it is going to her back, and keeping her up at night for the last 3 nights. Advised that she be evaluated immediately in ED, but that we could see her afterward as HFU and to establish care. She states that she would have to pay $200 at ED but only $50 to see a specialist. Explained our new patient policy. She thanked me and ended call.

## 2021-02-27 ENCOUNTER — Other Ambulatory Visit (HOSPITAL_COMMUNITY): Payer: Self-pay | Admitting: Gastroenterology

## 2021-02-27 DIAGNOSIS — R110 Nausea: Secondary | ICD-10-CM

## 2021-03-06 ENCOUNTER — Other Ambulatory Visit (HOSPITAL_COMMUNITY): Payer: Self-pay | Admitting: Gastroenterology

## 2021-03-06 ENCOUNTER — Other Ambulatory Visit: Payer: Self-pay

## 2021-03-06 ENCOUNTER — Ambulatory Visit (HOSPITAL_COMMUNITY)
Admission: RE | Admit: 2021-03-06 | Discharge: 2021-03-06 | Disposition: A | Discharge status: Home / Self Care | Payer: 59 | Source: Ambulatory Visit | Attending: Gastroenterology | Admitting: Gastroenterology

## 2021-03-06 DIAGNOSIS — R110 Nausea: Secondary | ICD-10-CM | POA: Diagnosis present

## 2021-03-06 MED ORDER — TECHNETIUM TC 99M MEBROFENIN IV KIT
5.3000 | PACK | Freq: Once | INTRAVENOUS | Status: AC | PRN
  Administered 2021-03-06: 5.3 via INTRAVENOUS

## 2021-03-11 ENCOUNTER — Other Ambulatory Visit: Payer: Self-pay | Admitting: Gastroenterology

## 2021-03-11 DIAGNOSIS — R110 Nausea: Secondary | ICD-10-CM

## 2021-03-19 ENCOUNTER — Ambulatory Visit
Admission: RE | Admit: 2021-03-19 | Discharge: 2021-03-19 | Disposition: A | Discharge status: Home / Self Care | Payer: 59 | Source: Ambulatory Visit | Attending: Gastroenterology | Admitting: Gastroenterology

## 2021-03-19 DIAGNOSIS — R110 Nausea: Secondary | ICD-10-CM

## 2021-04-10 NOTE — Patient Instructions (Signed)
DUE TO COVID-19 ONLY ONE VISITOR IS ALLOWED TO COME WITH YOU AND STAY IN THE WAITING ROOM ONLY DURING PRE OP AND PROCEDURE.   **NO VISITORS ARE ALLOWED IN THE SHORT STAY AREA OR RECOVERY ROOM!!**  IF YOU WILL BE ADMITTED INTO THE HOSPITAL YOU ARE ALLOWED ONLY TWO SUPPORT PEOPLE DURING VISITATION HOURS ONLY (10AM -8PM)   The support person(s) may change daily. The support person(s) must pass our screening, gel in and out, and wear a mask at all times, including in the patient's room. Patients must also wear a mask when staff or their support person are in the room.  No visitors under the age of 47. Any visitor under the age of 31 must be accompanied by an adult.     Your procedure is scheduled on: 04/14/21   Report to [facility] Main Entrance    Report to admitting at: 11:45 AM   Call this number if you have problems the morning of surgery [phone]   Do not eat food :After Midnight.   May have liquids until : 11:00 AM   day of surgery  CLEAR LIQUID DIET  Foods Allowed                                                                     Foods Excluded  Water, Black Coffee and tea, regular and decaf                             liquids that you cannot  Plain Jell-O in any flavor  (No red)                                           see through such as: Fruit ices (not with fruit pulp)                                     milk, soups, orange juice              Iced Popsicles (No red)                                    All solid food                                   Apple juices Sports drinks like Gatorade (No red) Lightly seasoned clear broth or consume(fat free) Sugar,   Sample Menu Breakfast                                Lunch                                     Supper Cranberry juice  Beef broth                            Chicken broth Jell-O                                     Grape juice                           Apple juice Coffee or tea                         Jell-O                                      Popsicle                                                Coffee or tea                        Coffee or tea     Oral Hygiene is also important to reduce your risk of infection.                                    Remember - BRUSH YOUR TEETH THE MORNING OF SURGERY WITH YOUR REGULAR TOOTHPASTE   Do NOT smoke after Midnight   Take these medicines the morning of surgery with A SIP OF WATER: plaquenil.  DO NOT TAKE ANY ORAL DIABETIC MEDICATIONS DAY OF YOUR SURGERY                              You may not have any metal on your body including hair pins, jewelry, and body piercing             Do not wear make-up, lotions, powders, perfumes/cologne, or deodorant  Do not wear nail polish including gel and S&S, artificial/acrylic nails, or any other type of covering on natural nails including finger and toenails. If you have artificial nails, gel coating, etc. that needs to be removed by a nail salon please have this removed prior to surgery or surgery may need to be canceled/ delayed if the surgeon/ anesthesia feels like they are unable to be safely monitored.   Do not shave  48 hours prior to surgery.    Do not bring valuables to the hospital. [place] IS NOT             RESPONSIBLE   FOR VALUABLES.   Contacts, dentures or bridgework may not be worn into surgery.   Bring small overnight bag day of surgery.    Patients discharged on the day of surgery will not be allowed to drive home.   Special Instructions: Bring a copy of your healthcare power of attorney and living will documents         the day of surgery if you haven't scanned them before.  Please read over the following fact sheets you were given: IF YOU HAVE QUESTIONS ABOUT YOUR PRE-OP INSTRUCTIONS PLEASE CALL [phone]   [facility] - Preparing for Surgery Before surgery, you can play an important role.  Because skin is not sterile, your skin needs to be as  free of germs as possible.  You can reduce the number of germs on your skin by washing with CHG (chlorahexidine gluconate) soap before surgery.  CHG is an antiseptic cleaner which kills germs and bonds with the skin to continue killing germs even after washing. Please DO NOT use if you have an allergy to CHG or antibacterial soaps.  If your skin becomes reddened/irritated stop using the CHG and inform your nurse when you arrive at Short Stay. Do not shave (including legs and underarms) for at least 48 hours prior to the first CHG shower.  You may shave your face/neck. Please follow these instructions carefully:  1.  Shower with CHG Soap the night before surgery and the  morning of Surgery.  2.  If you choose to wash your hair, wash your hair first as usual with your  normal  shampoo.  3.  After you shampoo, rinse your hair and body thoroughly to remove the  shampoo.                           4.  Use CHG as you would any other liquid soap.  You can apply chg directly  to the skin and wash                       Gently with a scrungie or clean washcloth.  5.  Apply the CHG Soap to your body ONLY FROM THE NECK DOWN.   Do not use on face/ open                           Wound or open sores. Avoid contact with eyes, ears mouth and genitals (private parts).                       Wash face,  Genitals (private parts) with your normal soap.             6.  Wash thoroughly, paying special attention to the area where your surgery  will be performed.  7.  Thoroughly rinse your body with warm water from the neck down.  8.  DO NOT shower/wash with your normal soap after using and rinsing off  the CHG Soap.                9.  Pat yourself dry with a clean towel.            10.  Wear clean pajamas.            11.  Place clean sheets on your bed the night of your first shower and do not  sleep with pets. Day of Surgery : Do not apply any lotions/deodorants the morning of surgery.  Please wear clean clothes to the  hospital/surgery center.  FAILURE TO FOLLOW THESE INSTRUCTIONS MAY RESULT IN THE CANCELLATION OF YOUR SURGERY PATIENT SIGNATURE_________________________________  NURSE SIGNATURE__________________________________  ________________________________________________________________________

## 2021-04-10 NOTE — Progress Notes (Signed)
Pt. Needs orders for upcoming surgery.PAT and labs 04/13/21.

## 2021-04-12 NOTE — H&P (Signed)
REFERRING PHYSICIAN: Kathi Der, MD  PROVIDER: Katha Cabal, MD  MRN: [id number] DOB: Jan 06, 1986 DATE OF ENCOUNTER: 04/02/2021  Subjective   Chief Complaint: New Consultation (Gallbladder)   History of Present Illness: Elizabeth Burke is a 35 y.o. female who is seen today as an office consultation at the request of Dr. Levora Angel for evaluation of New Consultation (Gallbladder) .   He has a systemic inflammatory condition for which she was taking a half of Avsola injections every 6 weeks. She has had history of hidradenitis suppurativa of the left groin left arm right arm in 2015 and 2019. She also had a tubal ligation in 19 2019. She has had children. She has been having several months of abdominal pain and on a hepatobiliary imaging study had delayed filling of the gallbladder. She had an which showed multiple nonmobile bile stones with a positive Murphy sign. Her primary care physician is Shirley Muscat and her doctor the referred her was Dr. Laymond Purser.  She is eager to go ahead and move forward with cholecystectomy. I reviewed cholecystectomy with her both on the robot and laparoscopically in some detail mentioning the risk including common bile duct injury and bile leaks and need for open Coley. I gave her a booklet on this as well as I explained that from the chart. She seems to understand and wants to proceed  Review of Systems: See HPI as well for other ROS.  ROS   Medical History: Past Medical History:  Diagnosis Date   Anemia   Arthritis   GERD (gastroesophageal reflux disease)   Patient Active Problem List  Diagnosis   Hidradenitis axillaris   Orthostatic hypotension   Syncope   Abnormal cervical Papanicolaou smear   Past Surgical History:  Procedure Laterality Date   AXILLARY HIDRADENITIS EXCISION Bilateral   LAPAROSCOPIC TUBAL LIGATION    No Known Allergies  Current Outpatient Medications on File Prior to Visit  Medication Sig  Dispense Refill   inFLIXimab (REMICADE) 100 mg injection Inject into the vein   No current facility-administered medications on file prior to visit.   Family History  Problem Relation Age of Onset   High blood pressure (Hypertension) Mother    Social History   Tobacco Use  Smoking Status Former Smoker  Smokeless Tobacco Never Used    Social History   Socioeconomic History   Marital status: Single  Tobacco Use   Smoking status: Former Smoker   Smokeless tobacco: Never Used  Substance and Sexual Activity   Alcohol use: Never   Drug use: Never   Objective:   Vitals:  04/02/21 1019  BP: 120/80  Temp: 36.7 C (98 F)  Weight: 98.4 kg (217 lb)  Height: 170.2 cm (5\' 7" )   Body mass index is 33.99 kg/m.  Physical Exam General: Well-developed pleasant African-American female HEENT : Unremarkable Chest: Clear to auscultation Heart: Sinus rhythm without murmurs or gallops Breast: Not examined Abdomen: Some tenderness on the right side to deep palpation. GU not examined Rectal not examined Extremities full range of motion Neuro alert and oriented x3. Motor and sensory function is grossly intact  Labs, Imaging and Diagnostic Testing: Ultrasound and NM scan reports  Assessment and Plan:  Diagnoses and all orders for this visit:  Calculus of gallbladder with acute on chronic cholecystitis without obstruction    I think she is a good candidate for cholecystectomy either laparoscopic or robotic. She is eager to have this done so we will do a whichever platform is the first  available.  Seen in preop and questions answered.  To OR for lap chole.    Kirandeep Fariss Charna Busman, MD

## 2021-04-13 ENCOUNTER — Encounter (HOSPITAL_COMMUNITY): Payer: Self-pay

## 2021-04-13 ENCOUNTER — Encounter (HOSPITAL_COMMUNITY)
Admission: RE | Admit: 2021-04-13 | Discharge: 2021-04-13 | Disposition: A | Discharge status: Home / Self Care | Payer: 59 | Source: Ambulatory Visit | Attending: Surgery | Admitting: Surgery

## 2021-04-13 ENCOUNTER — Other Ambulatory Visit: Payer: Self-pay

## 2021-04-13 DIAGNOSIS — I951 Orthostatic hypotension: Secondary | ICD-10-CM | POA: Diagnosis not present

## 2021-04-13 DIAGNOSIS — Z87891 Personal history of nicotine dependence: Secondary | ICD-10-CM | POA: Insufficient documentation

## 2021-04-13 DIAGNOSIS — Z79899 Other long term (current) drug therapy: Secondary | ICD-10-CM | POA: Diagnosis not present

## 2021-04-13 DIAGNOSIS — M459 Ankylosing spondylitis of unspecified sites in spine: Secondary | ICD-10-CM | POA: Insufficient documentation

## 2021-04-13 DIAGNOSIS — K819 Cholecystitis, unspecified: Secondary | ICD-10-CM | POA: Insufficient documentation

## 2021-04-13 DIAGNOSIS — Z01812 Encounter for preprocedural laboratory examination: Secondary | ICD-10-CM | POA: Diagnosis present

## 2021-04-13 DIAGNOSIS — K219 Gastro-esophageal reflux disease without esophagitis: Secondary | ICD-10-CM | POA: Insufficient documentation

## 2021-04-13 LAB — BASIC METABOLIC PANEL
Anion gap: 4 — ABNORMAL LOW (ref 5–15)
BUN: 7 mg/dL (ref 6–20)
CO2: 29 mmol/L (ref 22–32)
Calcium: 9 mg/dL (ref 8.9–10.3)
Chloride: 105 mmol/L (ref 98–111)
Creatinine, Ser: 0.69 mg/dL (ref 0.44–1.00)
GFR, Estimated: >60 mL/min (ref >60)
Glucose, Bld: 94 mg/dL (ref 70–99)
Potassium: 5.1 mmol/L (ref 3.5–5.1)
Sodium: 138 mmol/L (ref 135–145)

## 2021-04-13 LAB — CBC
HCT: 37.5 % (ref 36.0–46.0)
Hemoglobin: 11.2 g/dL — ABNORMAL LOW (ref 12.0–15.0)
MCH: 24.3 pg — ABNORMAL LOW (ref 26.0–34.0)
MCHC: 29.9 g/dL — ABNORMAL LOW (ref 30.0–36.0)
MCV: 81.3 fL (ref 80.0–100.0)
Platelets: 538 10*3/uL — ABNORMAL HIGH (ref 150–400)
RBC: 4.61 MIL/uL (ref 3.87–5.11)
RDW: 13.3 % (ref 11.5–15.5)
WBC: 9.1 10*3/uL (ref 4.0–10.5)
nRBC: 0 % (ref 0.0–0.2)

## 2021-04-13 NOTE — Progress Notes (Signed)
COVID Vaccine Completed: Yes Date COVID Vaccine completed: 02/2020 x 2 COVID vaccine manufacturer: Pfizer    COVID Test: N/A  PCP - Dr. Guerry Bruin Cardiologist - Dr. Chilton Si. LOV: 07/12/19  Chest x-ray -  EKG - 06/09/20. EPIC Stress Test -  ECHO - 11/01/17 Cardiac Cath -  Pacemaker/ICD device last checked:  Sleep Study -  CPAP -   Fasting Blood Sugar -  Checks Blood Sugar _____ times a day  Blood Thinner Instructions: Aspirin Instructions: Last Dose:  Anesthesia review: Hx: Syncope,orthostatic hypotension.  Patient denies shortness of breath, fever, cough and chest pain at PAT appointment   Patient verbalized understanding of instructions that were given to them at the PAT appointment. Patient was also instructed that they will need to review over the PAT instructions again at home before surgery.

## 2021-04-13 NOTE — Progress Notes (Signed)
Anesthesia Chart Review   Case: [id number] Date/Time: 04/14/21 1345   Procedure: LAPAROSCOPIC CHOLECYSTECTOMY   Anesthesia type: General   Pre-op diagnosis: cholecystitis   Location: [facility] ROOM 01 / [facility]   Surgeons: Luretha Murphy, MD       DISCUSSION:35 y.o. former smoker with h/o GERD, reactive arthritis with spondyloarthropathy, orthostatic HTN, cholecystitis scheduled for above procedure 04/14/21 with Dr. Luretha Murphy.   Anticipate pt can proceed with planned procedure barring acute status change.   VS: BP 134/79   Pulse 89   Temp 36.9 C (Oral)   Ht 5\' 7"  (1.702 m)   Wt 98.9 kg   LMP 04/03/2021   SpO2 97%   BMI 34.14 kg/m   PROVIDERS: 04/05/2021, PA is PCP   Delma Officer, MD is Cardiologist  LABS: Labs reviewed: Acceptable for surgery. (all labs ordered are listed, but only abnormal results are displayed)  Labs Reviewed  CBC - Abnormal; Notable for the following components:      Result Value   Hemoglobin 11.2 (*)    MCH 24.3 (*)    MCHC 29.9 (*)    Platelets 538 (*)    All other components within normal limits  BASIC METABOLIC PANEL - Abnormal; Notable for the following components:   Anion gap 4 (*)    All other components within normal limits     IMAGES:   EKG: 06/09/2020 Rate 71 bpm  NSR with sinus arrhythmia  CV: Echo 10/05/2017 Study Conclusions   - Left ventricle: Mid and basal inferior wall hypokinesis. The    cavity size was mildly dilated. Systolic function was normal. The    estimated ejection fraction was in the range of 50% to 55%. Wall    motion was normal; there were no regional wall motion    abnormalities. Left ventricular diastolic function parameters    were normal.  - Atrial septum: No defect or patent foramen ovale was identified.  Past Medical History:  Diagnosis Date   Allergy    Seasonal   Arthritis    Spondyloarthropathy--followed by Dr 12/05/2017   Death of child    tearful today , reports 58 mo old son  succumbed to SIDS on 12-29-17   GERD (gastroesophageal reflux disease) 2000 or so   H/O hiatal hernia    Headache(784.0)    Neuromuscular disorder (HCC)    Pins and needles in right arm following surgery for hidradenitis.   Orthostatic hypotension 09/20/2017   Scoliosis Dx2006   Syncope 09/20/2017   Urinary tract infection     Past Surgical History:  Procedure Laterality Date   APPLICATION OF WOUND VAC Left 01/25/2018   Procedure: APPLICATION OF WOUND VAC;  Surgeon: 01/27/2018, MD;  Location: [facility];  Service: General;  Laterality: Left;   FRACTURE SURGERY Left 2000   Left wrist--casted only, no surgery   HYDRADENITIS EXCISION Bilateral 07/31/2013   Procedure: EXCISION HYDRADENITIS RIGHT AXILLA  AND LEFT GROIN ;  Surgeon: 08/02/2013, MD;  Location: [facility];  Service: General;  Laterality: Bilateral;   HYDRADENITIS EXCISION Left 12/05/2017   Procedure: WIDE EXCISION HIDRADENITIS LEFT AXILLA ERAS PATHWAY;  Surgeon: 02/04/2018, MD;  Location: [facility];  Service: General;  Laterality: Left;   HYDRADENITIS EXCISION Left 01/25/2018   Procedure: LEFT AXILLARY WOUND EXPLORATION ERAS PATHWAY;  Surgeon: 01/27/2018, MD;  Location: [facility];  Service: General;  Laterality: Left;   LAPAROSCOPIC TUBAL LIGATION Bilateral 12/08/2017   Procedure: LAPAROSCOPIC TUBAL LIGATION with  Filshie clips;  Surgeon: Richarda Overlie, MD;  Location: [facility];  Service: Gynecology;  Laterality: Bilateral;   NOSE SURGERY N/A 2012   removal of fish hook     MEDICATIONS:  hydroxychloroquine (PLAQUENIL) 200 MG tablet   hydrOXYzine (ATARAX/VISTARIL) 25 MG tablet   ibuprofen (ADVIL) 800 MG tablet   inFLIXimab (REMICADE) 100 MG injection   Norethindrone-Ethinyl Estradiol-Fe Biphas (LO LOESTRIN FE) 1 MG-10 MCG / 10 MCG tablet   No current facility-administered medications for this encounter.     Jodell Cipro Ward, PA-C [facility] [phone]

## 2021-04-14 ENCOUNTER — Ambulatory Visit (HOSPITAL_COMMUNITY): Payer: 59 | Admitting: Physician Assistant

## 2021-04-14 ENCOUNTER — Ambulatory Visit (HOSPITAL_COMMUNITY): Payer: 59 | Admitting: Anesthesiology

## 2021-04-14 ENCOUNTER — Ambulatory Visit (HOSPITAL_COMMUNITY)
Admission: RE | Admit: 2021-04-14 | Discharge: 2021-04-14 | Disposition: A | Discharge status: Home / Self Care | Payer: 59 | Source: Ambulatory Visit | Attending: Surgery | Admitting: Surgery

## 2021-04-14 ENCOUNTER — Encounter (HOSPITAL_COMMUNITY): Payer: Self-pay | Admitting: Surgery

## 2021-04-14 ENCOUNTER — Ambulatory Visit (HOSPITAL_COMMUNITY): Payer: 59

## 2021-04-14 ENCOUNTER — Encounter (HOSPITAL_COMMUNITY)
Admission: RE | Disposition: A | Discharge status: Home / Self Care | Payer: Self-pay | Source: Ambulatory Visit | Attending: Surgery

## 2021-04-14 DIAGNOSIS — Z87891 Personal history of nicotine dependence: Secondary | ICD-10-CM | POA: Insufficient documentation

## 2021-04-14 DIAGNOSIS — K8012 Calculus of gallbladder with acute and chronic cholecystitis without obstruction: Secondary | ICD-10-CM | POA: Insufficient documentation

## 2021-04-14 DIAGNOSIS — K819 Cholecystitis, unspecified: Secondary | ICD-10-CM

## 2021-04-14 DIAGNOSIS — K811 Chronic cholecystitis: Secondary | ICD-10-CM | POA: Diagnosis present

## 2021-04-14 HISTORY — PX: CHOLECYSTECTOMY: SHX55

## 2021-04-14 LAB — PREGNANCY, URINE: Preg Test, Ur: NEGATIVE

## 2021-04-14 SURGERY — LAPAROSCOPIC CHOLECYSTECTOMY
Anesthesia: General | Site: Abdomen

## 2021-04-14 MED ORDER — LACTATED RINGERS IR SOLN
Status: DC | PRN
  Administered 2021-04-14: 1000 mL

## 2021-04-14 MED ORDER — CHLORHEXIDINE GLUCONATE CLOTH 2 % EX PADS: 6.0000 | MEDICATED_PAD | Freq: Once | CUTANEOUS | Status: DC

## 2021-04-14 MED ORDER — MIDAZOLAM HCL 2 MG/2ML IJ SOLN
INTRAMUSCULAR | Status: AC
  Filled 2021-04-14: qty 2

## 2021-04-14 MED ORDER — CHLORHEXIDINE GLUCONATE 0.12 % MT SOLN
15.0000 mL | Freq: Once | OROMUCOSAL | Status: AC
  Administered 2021-04-14: 15 mL via OROMUCOSAL

## 2021-04-14 MED ORDER — ONDANSETRON HCL 4 MG/2ML IJ SOLN
INTRAMUSCULAR | Status: DC | PRN
  Administered 2021-04-14: 4 mg via INTRAVENOUS

## 2021-04-14 MED ORDER — INDOCYANINE GREEN 25 MG IV SOLR
7.5000 mg | Freq: Once | INTRAVENOUS | Status: DC
  Filled 2021-04-14: qty 10

## 2021-04-14 MED ORDER — SCOPOLAMINE 1 MG/3DAYS TD PT72
1.0000 | MEDICATED_PATCH | TRANSDERMAL | Status: DC
  Administered 2021-04-14: 1.5 mg via TRANSDERMAL
  Filled 2021-04-14: qty 1

## 2021-04-14 MED ORDER — SODIUM CHLORIDE (PF) 0.9 % IJ SOLN
INTRAMUSCULAR | Status: DC | PRN
  Administered 2021-04-14: 8 mL

## 2021-04-14 MED ORDER — HYDROCODONE-ACETAMINOPHEN 5-325 MG PO TABS: 1.0000 | ORAL_TABLET | Freq: Four times a day (QID) | ORAL | 0 refills | Status: AC | PRN

## 2021-04-14 MED ORDER — LIDOCAINE 2% (20 MG/ML) 5 ML SYRINGE
INTRAMUSCULAR | Status: DC | PRN
Start: 2021-04-14 — End: 2021-04-14
  Administered 2021-04-14: 60 mg via INTRAVENOUS

## 2021-04-14 MED ORDER — ACETAMINOPHEN 500 MG PO TABS
1000.0000 mg | ORAL_TABLET | ORAL | Status: AC
  Administered 2021-04-14: 1000 mg via ORAL
  Filled 2021-04-14: qty 2

## 2021-04-14 MED ORDER — KETOROLAC TROMETHAMINE 30 MG/ML IJ SOLN
INTRAMUSCULAR | Status: AC
  Filled 2021-04-14: qty 1

## 2021-04-14 MED ORDER — KETOROLAC TROMETHAMINE 30 MG/ML IJ SOLN
30.0000 mg | Freq: Once | INTRAMUSCULAR | Status: AC | PRN
  Administered 2021-04-14: 30 mg via INTRAVENOUS

## 2021-04-14 MED ORDER — OXYCODONE HCL 5 MG/5ML PO SOLN: 5.0000 mg | Freq: Once | ORAL | Status: DC | PRN

## 2021-04-14 MED ORDER — LIDOCAINE HCL (PF) 2 % IJ SOLN
INTRAMUSCULAR | Status: AC
  Filled 2021-04-14: qty 5

## 2021-04-14 MED ORDER — LACTATED RINGERS IV SOLN: INTRAVENOUS | Status: DC

## 2021-04-14 MED ORDER — 0.9 % SODIUM CHLORIDE (POUR BTL) OPTIME
TOPICAL | Status: DC | PRN
  Administered 2021-04-14: 1000 mL

## 2021-04-14 MED ORDER — FENTANYL CITRATE (PF) 250 MCG/5ML IJ SOLN
INTRAMUSCULAR | Status: AC
  Filled 2021-04-14: qty 5

## 2021-04-14 MED ORDER — DEXAMETHASONE SODIUM PHOSPHATE 10 MG/ML IJ SOLN
INTRAMUSCULAR | Status: DC | PRN
  Administered 2021-04-14: 5 mg via INTRAVENOUS

## 2021-04-14 MED ORDER — CEFAZOLIN SODIUM-DEXTROSE 2-4 GM/100ML-% IV SOLN
2.0000 g | INTRAVENOUS | Status: AC
  Administered 2021-04-14: 2 g via INTRAVENOUS
  Filled 2021-04-14: qty 100

## 2021-04-14 MED ORDER — ONDANSETRON HCL 4 MG/2ML IJ SOLN
INTRAMUSCULAR | Status: AC
  Filled 2021-04-14: qty 2

## 2021-04-14 MED ORDER — ROCURONIUM BROMIDE 10 MG/ML (PF) SYRINGE
PREFILLED_SYRINGE | INTRAVENOUS | Status: DC | PRN
  Administered 2021-04-14: 50 mg via INTRAVENOUS
  Administered 2021-04-14: 20 mg via INTRAVENOUS

## 2021-04-14 MED ORDER — PROPOFOL 10 MG/ML IV BOLUS
INTRAVENOUS | Status: DC | PRN
  Administered 2021-04-14: 200 mg via INTRAVENOUS

## 2021-04-14 MED ORDER — BUPIVACAINE LIPOSOME 1.3 % IJ SUSP
INTRAMUSCULAR | Status: AC
  Filled 2021-04-14: qty 20

## 2021-04-14 MED ORDER — SUGAMMADEX SODIUM 500 MG/5ML IV SOLN
INTRAVENOUS | Status: AC
  Filled 2021-04-14: qty 5

## 2021-04-14 MED ORDER — FENTANYL CITRATE (PF) 250 MCG/5ML IJ SOLN
INTRAMUSCULAR | Status: DC | PRN
  Administered 2021-04-14: 100 ug via INTRAVENOUS
  Administered 2021-04-14: 50 ug via INTRAVENOUS
  Administered 2021-04-14: 100 ug via INTRAVENOUS

## 2021-04-14 MED ORDER — PROMETHAZINE HCL 25 MG/ML IJ SOLN: 6.2500 mg | INTRAMUSCULAR | Status: DC | PRN

## 2021-04-14 MED ORDER — FENTANYL CITRATE PF 50 MCG/ML IJ SOSY
25.0000 ug | PREFILLED_SYRINGE | INTRAMUSCULAR | Status: DC | PRN
  Administered 2021-04-14 (×2): 50 ug via INTRAVENOUS

## 2021-04-14 MED ORDER — BUPIVACAINE LIPOSOME 1.3 % IJ SUSP
INTRAMUSCULAR | Status: DC | PRN
  Administered 2021-04-14: 20 mL

## 2021-04-14 MED ORDER — FENTANYL CITRATE PF 50 MCG/ML IJ SOSY
PREFILLED_SYRINGE | INTRAMUSCULAR | Status: AC
  Filled 2021-04-14: qty 1

## 2021-04-14 MED ORDER — MIDAZOLAM HCL 2 MG/2ML IJ SOLN
INTRAMUSCULAR | Status: DC | PRN
  Administered 2021-04-14: 2 mg via INTRAVENOUS

## 2021-04-14 MED ORDER — AMISULPRIDE (ANTIEMETIC) 5 MG/2ML IV SOLN: 10.0000 mg | Freq: Once | INTRAVENOUS | Status: DC | PRN

## 2021-04-14 MED ORDER — ROCURONIUM BROMIDE 10 MG/ML (PF) SYRINGE
PREFILLED_SYRINGE | INTRAVENOUS | Status: AC
  Filled 2021-04-14: qty 10

## 2021-04-14 MED ORDER — SUGAMMADEX SODIUM 500 MG/5ML IV SOLN
INTRAVENOUS | Status: DC | PRN
  Administered 2021-04-14: 400 mg via INTRAVENOUS

## 2021-04-14 MED ORDER — DEXAMETHASONE SODIUM PHOSPHATE 10 MG/ML IJ SOLN
INTRAMUSCULAR | Status: AC
  Filled 2021-04-14: qty 1

## 2021-04-14 MED ORDER — ORAL CARE MOUTH RINSE: 15.0000 mL | Freq: Once | OROMUCOSAL | Status: AC

## 2021-04-14 MED ORDER — PROPOFOL 10 MG/ML IV BOLUS
INTRAVENOUS | Status: AC
  Filled 2021-04-14: qty 20

## 2021-04-14 MED ORDER — OXYCODONE HCL 5 MG PO TABS: 5.0000 mg | ORAL_TABLET | Freq: Once | ORAL | Status: DC | PRN

## 2021-04-14 MED ORDER — BUPIVACAINE LIPOSOME 1.3 % IJ SUSP: 20.0000 mL | Freq: Once | INTRAMUSCULAR | Status: DC

## 2021-04-14 SURGICAL SUPPLY — 49 items
ADH SKN CLS APL DERMABOND .7 (GAUZE/BANDAGES/DRESSINGS) ×1
APL SKNCLS STERI-STRIP NONHPOA (GAUZE/BANDAGES/DRESSINGS)
APL SWBSTK 6 STRL LF DISP (MISCELLANEOUS)
APPLICATOR COTTON TIP 6 STRL (MISCELLANEOUS) ×2 IMPLANT
APPLICATOR COTTON TIP 6IN STRL (MISCELLANEOUS)
APPLIER CLIP 5 13 M/L LIGAMAX5 (MISCELLANEOUS)
APPLIER CLIP ROT 10 11.4 M/L (STAPLE)
APR CLP MED LRG 11.4X10 (STAPLE)
APR CLP MED LRG 5 ANG JAW (MISCELLANEOUS)
BAG COUNTER SPONGE SURGICOUNT (BAG) IMPLANT
BAG SPEC RTRVL 10 TROC 200 (ENDOMECHANICALS) ×1
BAG SPNG CNTER NS LX DISP (BAG)
BENZOIN TINCTURE PRP APPL 2/3 (GAUZE/BANDAGES/DRESSINGS) IMPLANT
CABLE HIGH FREQUENCY MONO STRZ (ELECTRODE) IMPLANT
CATH REDDICK CHOLANGI 4FR 50CM (CATHETERS) ×2 IMPLANT
CLIP APPLIE 5 13 M/L LIGAMAX5 (MISCELLANEOUS) IMPLANT
CLIP APPLIE ROT 10 11.4 M/L (STAPLE) IMPLANT
COVER MAYO STAND STRL (DRAPES) ×1 IMPLANT
COVER SURGICAL LIGHT HANDLE (MISCELLANEOUS) ×2 IMPLANT
DECANTER SPIKE VIAL GLASS SM (MISCELLANEOUS) ×1 IMPLANT
DERMABOND ADVANCED (GAUZE/BANDAGES/DRESSINGS) ×1
DERMABOND ADVANCED .7 DNX12 (GAUZE/BANDAGES/DRESSINGS) ×1 IMPLANT
DRAPE C-ARM 42X120 X-RAY (DRAPES) ×2 IMPLANT
ELECT L-HOOK LAP 45CM DISP (ELECTROSURGICAL) ×2
ELECT PENCIL ROCKER SW 15FT (MISCELLANEOUS) ×1 IMPLANT
ELECT REM PT RETURN 15FT ADLT (MISCELLANEOUS) ×2 IMPLANT
ELECTRODE L-HOOK LAP 45CM DISP (ELECTROSURGICAL) IMPLANT
GLOVE SURG ENC TEXT LTX SZ8 (GLOVE) ×2 IMPLANT
GOWN STRL REUS W/TWL XL LVL3 (GOWN DISPOSABLE) ×2 IMPLANT
GRASPER SUT TROCAR 14GX15 (MISCELLANEOUS) ×1 IMPLANT
HEMOSTAT SURGICEL 4X8 (HEMOSTASIS) IMPLANT
IRRIG SUCT STRYKERFLOW 2 WTIP (MISCELLANEOUS) ×2
IRRIGATION SUCT STRKRFLW 2 WTP (MISCELLANEOUS) ×1 IMPLANT
IV CATH 14GX2 1/4 (CATHETERS) ×2 IMPLANT
KIT BASIN OR (CUSTOM PROCEDURE TRAY) ×2 IMPLANT
KIT TURNOVER KIT A (KITS) ×2 IMPLANT
POUCH RETRIEVAL ECOSAC 10 (ENDOMECHANICALS) IMPLANT
POUCH RETRIEVAL ECOSAC 10MM (ENDOMECHANICALS) ×2
SCISSORS LAP 5X45 EPIX DISP (ENDOMECHANICALS) ×2 IMPLANT
SET TUBE SMOKE EVAC HIGH FLOW (TUBING) ×2 IMPLANT
SLEEVE XCEL OPT CAN 5 100 (ENDOMECHANICALS) ×2 IMPLANT
STRIP CLOSURE SKIN 1/2X4 (GAUZE/BANDAGES/DRESSINGS) IMPLANT
SUT MNCRL AB 4-0 PS2 18 (SUTURE) ×4 IMPLANT
SYR 20ML LL LF (SYRINGE) ×2 IMPLANT
TOWEL OR 17X26 10 PK STRL BLUE (TOWEL DISPOSABLE) ×2 IMPLANT
TRAY LAPAROSCOPIC (CUSTOM PROCEDURE TRAY) ×2 IMPLANT
TROCAR BLADELESS OPT 5 100 (ENDOMECHANICALS) ×2 IMPLANT
TROCAR XCEL BLUNT TIP 100MML (ENDOMECHANICALS) IMPLANT
TROCAR XCEL NON-BLD 11X100MML (ENDOMECHANICALS) ×1 IMPLANT

## 2021-04-14 NOTE — Anesthesia Postprocedure Evaluation (Signed)
Anesthesia Post Note  Patient: Elizabeth Burke  Procedure(s) Performed: LAPAROSCOPIC CHOLECYSTECTOMY WITH IOC (Abdomen)     Patient location during evaluation: PACU Anesthesia Type: General Level of consciousness: awake Pain management: pain level controlled Vital Signs Assessment: post-procedure vital signs reviewed and stable Respiratory status: spontaneous breathing, nonlabored ventilation, respiratory function stable and patient connected to nasal cannula oxygen Cardiovascular status: blood pressure returned to baseline and stable Postop Assessment: no apparent nausea or vomiting Anesthetic complications: no   No notable events documented.  Last Vitals:  Vitals:   04/14/21 1400 04/14/21 1418  BP: 110/67 134/80  Pulse: (!) 58 81  Resp: 20 20  Temp:  36.8 C  SpO2: 98% 100%    Last Pain:  Vitals:   04/14/21 1418  TempSrc:   PainSc: 5                  Keldan Eplin P Kenda Kloehn

## 2021-04-14 NOTE — Interval H&P Note (Signed)
History and Physical Interval Note:  04/14/2021 11:22 AM  Elizabeth Burke  has presented today for surgery, with the diagnosis of cholecystitis.  The various methods of treatment have been discussed with the patient and family. After consideration of risks, benefits and other options for treatment, the patient has consented to  Procedure(s): LAPAROSCOPIC CHOLECYSTECTOMY (N/A) as a surgical intervention.  The patient's history has been reviewed, patient examined, no change in status, stable for surgery.  I have reviewed the patient's chart and labs.  Questions were answered to the patient's satisfaction.     Valarie Merino

## 2021-04-14 NOTE — Transfer of Care (Signed)
Immediate Anesthesia Transfer of Care Note  Patient: Elizabeth Burke  Procedure(s) Performed: LAPAROSCOPIC CHOLECYSTECTOMY WITH IOC (Abdomen)  Patient Location: PACU  Anesthesia Type:General  Level of Consciousness: awake, drowsy and responds to stimulation  Airway & Oxygen Therapy: Patient Spontanous Breathing and Patient connected to face mask oxygen  Post-op Assessment: Report given to RN and Post -op Vital signs reviewed and stable  Post vital signs: Reviewed and stable  Last Vitals:  Vitals Value Taken Time  BP 129/90 04/14/21 1311  Temp 36.8 C 04/14/21 1311  Pulse 69 04/14/21 1313  Resp 24 04/14/21 1313  SpO2 100 % 04/14/21 1313  Vitals shown include unvalidated device data.  Last Pain:  Vitals:   04/14/21 1113  TempSrc:   PainSc: 0-No pain         Complications: No notable events documented.

## 2021-04-14 NOTE — Op Note (Signed)
Elizabeth Burke  Primary Care Physician:  Delma Officer, [place]    04/14/2021  1:07 PM  Procedure: Laparoscopic Cholecystectomy with intraoperative cholangiogram  Surgeon: Susy Frizzle B. Daphine Deutscher, MD, FACS Asst:  none  Anes:  General  Drains:  None  Findings: Chronic cholecystitis  Description of Procedure: The patient was taken to OR 4 and given general anesthesia.  The patient was prepped with chlorohexidine prep and draped sterilely. A time out was performed including identifying the patient and discussing their procedure.  Access to the abdomen was achieved with a 5 mm optivew through the right upper quadrant.  Port placement included three 5 mm trocars and one 11 mm trocar.    The gallbladder was visualized and appeared thickened and gray.   The fundus of the gallbaldder was grasped and the gallbladder was elevated. Traction on the infundibulum allowed for successful demonstration of the critical view. Inflammatory changes were chronic.  The cystic duct was identified and clipped up on the gallbladder and an incision was made in the cystic duct and the Reddick catheter was inserted after milking the cystic duct of any debris. A dynamic cholangiogram was performed which demonstrated a normal length cystic duct, and intrahepatic filling of small CBD structures and free flow into the duodenum.    The cystic duct was then triple clipped and divided, the cystic artery was double clipped and divided and then the gallbladder was removed from the gallbladder bed. Removal of the gallbladder from the gallbladder bed was performed with slight spillage of bile but no stones.  The gallbladder was then placed in a bag and brought out through one of the trocar sites. The gallbladder bed was inspected and no bleeding or bile leaks were seen.   Laparoscopic visualization was used when closing fascial defects for trocar sites.   Incisions were injected with exparel and closed with 4-0 Monocryl and Dermabond on  the skin.  Sponge and needle count were correct.    The patient was taken to the recovery room in satisfactory condition.

## 2021-04-14 NOTE — Anesthesia Preprocedure Evaluation (Addendum)
Anesthesia Evaluation  Patient identified by MRN, date of birth, ID band Patient awake    Reviewed: Allergy & Precautions, NPO status , Patient's Chart, lab work & pertinent test results  Airway Mallampati: II  TM Distance: >3 FB Neck ROM: Full    Dental no notable dental hx.    Pulmonary former smoker,    Pulmonary exam normal breath sounds clear to auscultation       Cardiovascular negative cardio ROS Normal cardiovascular exam Rhythm:Regular Rate:Normal  ECG: NSR, rate 71   Neuro/Psych  Headaches, negative psych ROS   GI/Hepatic Neg liver ROS, hiatal hernia, GERD  ,  Endo/Other  negative endocrine ROS  Renal/GU negative Renal ROS     Musculoskeletal  (+) Arthritis ,   Abdominal (+) + obese,   Peds  Hematology negative hematology ROS (+)   Anesthesia Other Findings cholecystitis  Reproductive/Obstetrics S/p tubal ligation                             Anesthesia Physical Anesthesia Plan  ASA: 2  Anesthesia Plan: General   Post-op Pain Management:    Induction: Intravenous  PONV Risk Score and Plan: 4 or greater and Ondansetron, Dexamethasone, Midazolam, Scopolamine patch - Pre-op and Treatment may vary due to age or medical condition  Airway Management Planned: Oral ETT  Additional Equipment:   Intra-op Plan:   Post-operative Plan: Extubation in OR  Informed Consent: I have reviewed the patients History and Physical, chart, labs and discussed the procedure including the risks, benefits and alternatives for the proposed anesthesia with the patient or authorized representative who has indicated his/her understanding and acceptance.     Dental advisory given  Plan Discussed with: CRNA  Anesthesia Plan Comments:        Anesthesia Quick Evaluation

## 2021-04-14 NOTE — Anesthesia Procedure Notes (Signed)
Procedure Name: Intubation Date/Time: 04/14/2021 11:44 AM Performed by: Elyn Peers, CRNA Pre-anesthesia Checklist: Patient identified, Emergency Drugs available, Suction available, Patient being monitored and Timeout performed Patient Re-evaluated:Patient Re-evaluated prior to induction Oxygen Delivery Method: Circle system utilized Preoxygenation: Pre-oxygenation with 100% oxygen Induction Type: IV induction Ventilation: Mask ventilation without difficulty Laryngoscope Size: Miller and 3 Grade View: Grade II Tube type: Oral Tube size: 7.5 mm Number of attempts: 1 Airway Equipment and Method: Stylet Placement Confirmation: ETT inserted through vocal cords under direct vision, positive ETCO2 and breath sounds checked- equal and bilateral Secured at: 23 cm Tube secured with: Tape Dental Injury: Teeth and Oropharynx as per pre-operative assessment

## 2021-04-15 ENCOUNTER — Encounter (HOSPITAL_COMMUNITY): Payer: Self-pay | Admitting: Surgery

## 2021-04-16 LAB — SURGICAL PATHOLOGY

## 2021-08-03 ENCOUNTER — Emergency Department (HOSPITAL_COMMUNITY): Payer: 59

## 2021-08-03 ENCOUNTER — Encounter (HOSPITAL_COMMUNITY): Payer: Self-pay

## 2021-08-03 ENCOUNTER — Other Ambulatory Visit: Payer: Self-pay

## 2021-08-03 ENCOUNTER — Emergency Department (HOSPITAL_COMMUNITY)
Admission: EM | Admit: 2021-08-03 | Discharge: 2021-08-03 | Disposition: A | Discharge status: Home / Self Care | Payer: 59 | Attending: Emergency Medicine | Admitting: Emergency Medicine

## 2021-08-03 DIAGNOSIS — N9489 Other specified conditions associated with female genital organs and menstrual cycle: Secondary | ICD-10-CM | POA: Insufficient documentation

## 2021-08-03 DIAGNOSIS — R0789 Other chest pain: Secondary | ICD-10-CM | POA: Insufficient documentation

## 2021-08-03 DIAGNOSIS — R0602 Shortness of breath: Secondary | ICD-10-CM | POA: Insufficient documentation

## 2021-08-03 DIAGNOSIS — R110 Nausea: Secondary | ICD-10-CM | POA: Insufficient documentation

## 2021-08-03 DIAGNOSIS — R079 Chest pain, unspecified: Secondary | ICD-10-CM | POA: Diagnosis present

## 2021-08-03 DIAGNOSIS — R61 Generalized hyperhidrosis: Secondary | ICD-10-CM | POA: Insufficient documentation

## 2021-08-03 LAB — BASIC METABOLIC PANEL
Anion gap: 10 (ref 5–15)
BUN: 10 mg/dL (ref 6–20)
CO2: 24 mmol/L (ref 22–32)
Calcium: 8.8 mg/dL — ABNORMAL LOW (ref 8.9–10.3)
Chloride: 105 mmol/L (ref 98–111)
Creatinine, Ser: 0.83 mg/dL (ref 0.44–1.00)
GFR, Estimated: >60 mL/min (ref >60)
Glucose, Bld: 100 mg/dL — ABNORMAL HIGH (ref 70–99)
Potassium: 4.7 mmol/L (ref 3.5–5.1)
Sodium: 139 mmol/L (ref 135–145)

## 2021-08-03 LAB — TROPONIN I (HIGH SENSITIVITY)
Troponin I (High Sensitivity): 2 ng/L (ref <18)
Troponin I (High Sensitivity): 3 ng/L (ref <18)

## 2021-08-03 LAB — CBC
HCT: 37.5 % (ref 36.0–46.0)
Hemoglobin: 11.3 g/dL — ABNORMAL LOW (ref 12.0–15.0)
MCH: 24 pg — ABNORMAL LOW (ref 26.0–34.0)
MCHC: 30.1 g/dL (ref 30.0–36.0)
MCV: 79.6 fL — ABNORMAL LOW (ref 80.0–100.0)
Platelets: 427 10*3/uL — ABNORMAL HIGH (ref 150–400)
RBC: 4.71 MIL/uL (ref 3.87–5.11)
RDW: 14.6 % (ref 11.5–15.5)
WBC: 8.5 10*3/uL (ref 4.0–10.5)
nRBC: 0 % (ref 0.0–0.2)

## 2021-08-03 LAB — I-STAT BETA HCG BLOOD, ED (MC, WL, AP ONLY): I-stat hCG, quantitative: <5 m[IU]/mL (ref <5)

## 2021-08-03 MED ORDER — KETOROLAC TROMETHAMINE 15 MG/ML IJ SOLN
15.0000 mg | Freq: Once | INTRAMUSCULAR | Status: AC
  Administered 2021-08-03: 15 mg via INTRAVENOUS
  Filled 2021-08-03: qty 1

## 2021-08-03 MED ORDER — IOHEXOL 350 MG/ML SOLN
100.0000 mL | Freq: Once | INTRAVENOUS | Status: AC
  Administered 2021-08-03: 65 mL via INTRAVENOUS

## 2021-08-03 NOTE — ED Provider Notes (Signed)
Care transferred from [facility], PA-C at time of sign out. See their note for full assessment.   HPI: "Patient is a 36 year old female with a very of hidradenitis suppurativa as well as ankylosing spondylitis who presents to the emergency department due to chest pain.  Patient states that she has been experiencing intermittent chest pain for years.  She states that when it occurs it will typically last for days at a time and will only improve with anti-inflammatories.  She states that began once again about 4 days ago and has been waxing and waning.  She has been taking ibuprofen as needed with mild short-term relief.  She woke up around 2 AM with 10/10 central, stabbing, chest pain.  She states that she had associated shortness of breath, nausea, as well as diaphoresis.  She does note that she experiences chronic nausea and does not feel like it has acutely worsened.  Due to the severity of her pain she came to the emergency department.  She states that since coming to the emergency department her symptoms have improved mildly and her current pain is 5/10.  Denies any recent travel, leg swelling, immobilization, surgery, cancer treatment, hemoptysis, history of blood clots."  Physical Exam  BP 130/85    Pulse (!) 59    Temp 98.1 F (36.7 C) (Oral)    Resp 16    Ht 5\' 7"  (1.702 m)    Wt 92.1 kg    LMP 07/24/2021    SpO2 100%    BMI 31.79 kg/m   Physical Exam Vitals and nursing note reviewed.  Constitutional:      General: She is not in acute distress.    Appearance: She is not diaphoretic.  HENT:     Head: Normocephalic and atraumatic.     Mouth/Throat:     Pharynx: No oropharyngeal exudate.  Eyes:     General: No scleral icterus.    Conjunctiva/sclera: Conjunctivae normal.  Cardiovascular:     Rate and Rhythm: Normal rate and regular rhythm.     Pulses: Normal pulses.     Heart sounds: Normal heart sounds.  Pulmonary:     Effort: Pulmonary effort is normal. No respiratory distress.      Breath sounds: Normal breath sounds. No wheezing.  Chest:     Chest wall: Tenderness present. No crepitus.     Comments: Left chest wall tenderness to palpation.  No overlying skin changes.  No crepitus or deformities noted. Abdominal:     General: Bowel sounds are normal.     Palpations: Abdomen is soft. There is no mass.     Tenderness: There is no abdominal tenderness. There is no guarding or rebound.  Musculoskeletal:        General: Normal range of motion.     Cervical back: Normal range of motion and neck supple.  Skin:    General: Skin is warm and dry.  Neurological:     Mental Status: She is alert.  Psychiatric:        Behavior: Behavior normal.    Procedures  Procedures  ED Course / MDM   Results for orders placed or performed during the hospital encounter of 08/03/21  Basic metabolic panel  Result Value Ref Range   Sodium 139 135 - 145 mmol/L   Potassium 4.7 3.5 - 5.1 mmol/L   Chloride 105 98 - 111 mmol/L   CO2 24 22 - 32 mmol/L   Glucose, Bld 100 (H) 70 - 99 mg/dL  BUN 10 6 - 20 mg/dL   Creatinine, Ser 6.210.83 0.44 - 1.00 mg/dL   Calcium 8.8 (L) 8.9 - 10.3 mg/dL   GFR, Estimated >30>60 >86>60 mL/min   Anion gap 10 5 - 15  CBC  Result Value Ref Range   WBC 8.5 4.0 - 10.5 K/uL   RBC 4.71 3.87 - 5.11 MIL/uL   Hemoglobin 11.3 (L) 12.0 - 15.0 g/dL   HCT 57.837.5 46.936.0 - 62.946.0 %   MCV 79.6 (L) 80.0 - 100.0 fL   MCH 24.0 (L) 26.0 - 34.0 pg   MCHC 30.1 30.0 - 36.0 g/dL   RDW 52.814.6 41.311.5 - 24.415.5 %   Platelets 427 (H) 150 - 400 K/uL   nRBC 0.0 0.0 - 0.2 %  I-Stat beta hCG blood, ED  Result Value Ref Range   I-stat hCG, quantitative <5.0 <5 mIU/mL   Comment 3          Troponin I (High Sensitivity)  Result Value Ref Range   Troponin I (High Sensitivity) 2 <18 ng/L  Troponin I (High Sensitivity)  Result Value Ref Range   Troponin I (High Sensitivity) 3 <18 ng/L   DG Chest 2 View  Result Date: 08/03/2021 CLINICAL DATA:  Chest pain for 4 days. EXAM: CHEST - 2 VIEW  COMPARISON:  PA Lat 09/05/2019. FINDINGS: The heart size and mediastinal contours are within normal limits. Both lungs are clear. The visualized skeletal structures are unremarkable apart from slight chronic lower thoracic dextroscoliosis. IMPRESSION: No active cardiopulmonary disease.  Stable chest. Electronically Signed   By: Almira BarKeith  Chesser M.D.   On: 08/03/2021 05:21   CT Angio Chest PE W and/or Wo Contrast  Result Date: 08/03/2021 CLINICAL DATA:  Pulmonary embolism (PE) suspected, high prob EXAM: CT ANGIOGRAPHY CHEST WITH CONTRAST TECHNIQUE: Multidetector CT imaging of the chest was performed using the standard protocol during bolus administration of intravenous contrast. Multiplanar CT image reconstructions and MIPs were obtained to evaluate the vascular anatomy. RADIATION DOSE REDUCTION: This exam was performed according to the departmental dose-optimization program which includes automated exposure control, adjustment of the mA and/or kV according to patient size and/or use of iterative reconstruction technique. CONTRAST:  65mL OMNIPAQUE IOHEXOL 350 MG/ML SOLN COMPARISON:  Chest XR, concurrent. FINDINGS: Suboptimal evaluation, secondary to poor contrast opacification such that subsegmental emboli could missed. Cardiovascular: Satisfactory opacification of the pulmonary arteries to the central pulmonary arterial level. No segmental or larger pulmonary embolus. Normal heart size. No pericardial effusion. Mediastinum/Nodes: No enlarged mediastinal, hilar, or axillary lymph nodes. Thyroid gland, trachea, and esophagus demonstrate no significant findings. Lungs/Pleura: Trace bibasilar atelectasis. Lungs are otherwise clear. No pleural effusion or pneumothorax. Upper Abdomen: No acute abnormality. Musculoskeletal: No chest wall abnormality. No acute or significant osseous findings. Review of the MIP images confirms the above findings. IMPRESSION: Suboptimal evaluation, within these constraints; 1. No segmental  or larger pulmonary embolus. 2. No acute intrathoracic findings. Electronically Signed   By: Roanna BanningJon  Mugweru M.D.   On: 08/03/2021 07:26    Clinical Course as of 08/03/21 [id number]  Mon Aug 03, 2021  [id number] Patient reevaluated and noted improvement of her symptoms in the ED with treatment regimen.  Notified patient of lab and imaging findings. [SB]  0818 Detailed discussion held with patient at bedside prior to discharge regarding follow-up with rheumatologist for further evaluation of her symptoms and symptom management between infusions.  Patient agreeable to discharge treatment plan.  Patient for safe for discharge at this time. [SB]  Clinical Course User Index [SB] Marieta Markov A, PA-C   Medical Decision Making Amount and/or Complexity of Data Reviewed Labs: ordered. Radiology: ordered.  Risk Prescription drug management.   Plan per previous PA-C at time of sign out: CTA and if clear, then can safely discharge home.   Patient presents to the ED with chest pain onset this morning.  Denies recent injury, trauma, fall, heavy lifting.  Patient has a history of ankylosing spondylitis that is treated with infusions every 8 weeks by her rheumatologist, Dr. Deanne Coffer.  Patient notes that between each infusion she has neck pain, shoulder pain, chest pain, bilateral elbow pain.  Her rheumatologist is aware of this.  Her symptoms are alleviated with NSAIDs at home.  Vital signs stable, patient afebrile, not tachycardic, or hypoxic.  No past medical history of MI, CAD, diabetes, hypertension, cardiac catheterization, stents.  On exam patient with tenderness to palpation to the left chest wall.  No acute cardiovascular, respiratory, abdominal exam findings.  Patient notes that she typically receives her Remicade infusion every 8 weeks with her most recent infusion being 5 weeks ago.  She notes that in between her infusions she typically has neck pain, shoulder pain, chest pain, elbow pain.  She notes that her  rheumatologist is aware of her symptoms and they are working on a better infusion for her. Differential diagnosis includes ACS, PE, pneumonia.   Heart score of 1.   Labs:  I personally interpreted labs that were ordered by previous PA-C prior to signout.  The pertinent results include:  Troponin is negative in the ED. CBC without leukocytosis. BMP without electrolyte abnormality or AKI. I-STAT beta-hCG negative.  Imaging: I independently visualized and interpreted imaging that were ordered by previous PA-C prior to signout.   Imaging studies included chest x-ray, CTA PE.  Chest x-ray showed negative for acute cardiopulmonary findings. CTA PE negative for pulmonary embolism. I agree with the radiologist interpretation  Medications:  Medications ordered by previous PA-C prior to signout included: Toradol for pain management. Reevaluation of the patient after these medicines showed that the patient resolved I have reviewed the patients home medicines and have made adjustments as needed  Reevaluation: After the interventions noted above, I reevaluated the patient and found that they have :resolved  Disposition: Pt presentation suspicious for musculoskeletal in nature. Doubt PE, PNA, ACS at this time. After consideration of the diagnostic results and the patients response to treatment, I feel that the patent would benefit from discharge home with follow-up with rheumatologist.  Supportive care measures and strict return precautions discussed with patient at bedside.  Patient acknowledges and verbalized understanding.  Patient appears safe for discharge.  Follow-up as indicated discharge paperwork.   This chart was dictated using voice recognition software, Dragon. Despite the best efforts of this provider to proofread and correct errors, errors may still occur which can change documentation meaning.    Swathi Dauphin A, PA-C 08/03/21 [id number]    Tilden Fossa, MD 08/04/21 1550

## 2021-08-03 NOTE — ED Notes (Signed)
Transported to CT 

## 2021-08-03 NOTE — ED Triage Notes (Signed)
Pt presents to the ED from home with complaints of left sided chest pain, SOB. Pt woke up from sleep like this. Pt states she feels like someone stabbing her in her chest. Taking ibuprofen without relief. Pain present for the last 4 days at times better than others.

## 2021-08-03 NOTE — ED Provider Notes (Addendum)
[facility] EMERGENCY DEPARTMENT Provider Note   CSN: [id number] Arrival date & time: 08/03/21  0355     History  Chief Complaint  Patient presents with   Chest Pain    Elizabeth Burke is a 36 y.o. female.  HPI Patient is a 36 year old female with a very of hidradenitis suppurativa as well as ankylosing spondylitis who presents to the emergency department due to chest pain.  Patient states that she has been experiencing intermittent chest pain for years.  She states that when it occurs it will typically last for days at a time and will only improve with anti-inflammatories.  She states that began once again about 4 days ago and has been waxing and waning.  She has been taking ibuprofen as needed with mild short-term relief.  She woke up around 2 AM with 10/10 central, stabbing, chest pain.  She states that she had associated shortness of breath, nausea, as well as diaphoresis.  She does note that she experiences chronic nausea and does not feel like it has acutely worsened.  Due to the severity of her pain she came to the emergency department.  She states that since coming to the emergency department her symptoms have improved mildly and her current pain is 5/10.  Denies any recent travel, leg swelling, immobilization, surgery, cancer treatment, hemoptysis, history of blood clots.    Home Medications Prior to Admission medications   Medication Sig Start Date End Date Taking? Authorizing Provider  HYDROcodone-acetaminophen (NORCO/VICODIN) 5-325 MG tablet Take 1 tablet by mouth every 6 (six) hours as needed for moderate pain. 04/14/21   Johnathan Hausen, MD  hydroxychloroquine (PLAQUENIL) 200 MG tablet Take 200 mg by mouth 2 (two) times daily. 04/05/21   [provider]  hydrOXYzine (ATARAX/VISTARIL) 25 MG tablet Take 1 tablet (25 mg total) by mouth every 6 (six) hours as needed for itching. Patient not taking: Reported on 04/08/2021 09/01/20   Wieters, Hallie C,  PA-C  ibuprofen (ADVIL) 800 MG tablet Take 1 tablet (800 mg total) by mouth 3 (three) times daily. 06/05/20   Wieters, Hallie C, PA-C  inFLIXimab (REMICADE) 100 MG injection Inject 100 mg into the vein every 8 (eight) weeks.    [provider]  Norethindrone-Ethinyl Estradiol-Fe Biphas (LO LOESTRIN FE) 1 MG-10 MCG / 10 MCG tablet Take 1 tablet by mouth daily.    [provider]      Allergies    Patient has no known allergies.    Review of Systems   Review of Systems  All other systems reviewed and are negative. Ten systems reviewed and are negative for acute change, except as noted in the HPI.   Physical Exam Updated Vital Signs BP (!) 129/97    Pulse 63    Temp 98.2 F (36.8 C) (Oral)    Resp (!) 22    Ht 5\' 7"  (1.702 m)    Wt 92.1 kg    LMP 07/24/2021    SpO2 100%    BMI 31.79 kg/m  Physical Exam Vitals and nursing note reviewed.  Constitutional:      General: She is not in acute distress.    Appearance: Normal appearance. She is not ill-appearing, toxic-appearing or diaphoretic.  HENT:     Head: Normocephalic and atraumatic.     Right Ear: External ear normal.     Left Ear: External ear normal.     Nose: Nose normal.     Mouth/Throat:     Mouth: Mucous  membranes are moist.     Pharynx: Oropharynx is clear. No oropharyngeal exudate or posterior oropharyngeal erythema.  Eyes:     Extraocular Movements: Extraocular movements intact.  Cardiovascular:     Rate and Rhythm: Normal rate and regular rhythm.     Pulses: Normal pulses.          Radial pulses are 2+ on the right side and 2+ on the left side.       Dorsalis pedis pulses are 2+ on the right side and 2+ on the left side.     Heart sounds: Normal heart sounds. Heart sounds not distant. No murmur heard. No systolic murmur is present.  No diastolic murmur is present.    No friction rub. No gallop. No S3 or S4 sounds.  Pulmonary:     Effort: Pulmonary effort is normal. No tachypnea, accessory muscle usage  or respiratory distress.     Breath sounds: Normal breath sounds. No stridor. No decreased breath sounds, wheezing, rhonchi or rales.  Chest:     Chest wall: Tenderness present.     Comments: Moderate tenderness noted along the anterior chest wall that appears to be worst overlying the sternum. Abdominal:     General: Abdomen is flat.     Palpations: Abdomen is soft.     Tenderness: There is no abdominal tenderness.     Comments: Abdomen is soft and nontender.  Musculoskeletal:        General: Normal range of motion.     Cervical back: Normal range of motion and neck supple. No tenderness.     Right lower leg: No tenderness. No edema.     Left lower leg: No tenderness. No edema.     Comments: No pedal edema.  No calf tenderness.  2+ DP pulses.  Skin:    General: Skin is warm and dry.  Neurological:     General: No focal deficit present.     Mental Status: She is alert and oriented to person, place, and time.  Psychiatric:        Mood and Affect: Mood normal.        Behavior: Behavior normal.   ED Results / Procedures / Treatments   Labs (all labs ordered are listed, but only abnormal results are displayed) Labs Reviewed  BASIC METABOLIC PANEL - Abnormal; Notable for the following components:      Result Value   Glucose, Bld 100 (*)    Calcium 8.8 (*)    All other components within normal limits  CBC - Abnormal; Notable for the following components:   Hemoglobin 11.3 (*)    MCV 79.6 (*)    MCH 24.0 (*)    Platelets 427 (*)    All other components within normal limits  I-STAT BETA HCG BLOOD, ED ([facility], [facility], [facility] ONLY)  TROPONIN I (HIGH SENSITIVITY)  TROPONIN I (HIGH SENSITIVITY)    EKG EKG Interpretation  Date/Time:  Monday August 03 2021 04:09:45 EST Ventricular Rate:  65 PR Interval:  146 QRS Duration: 78 QT Interval:  388 QTC Calculation: 403 R Axis:   69 Text Interpretation: Normal sinus rhythm with sinus arrhythmia Normal ECG Confirmed by Quintella Reichert [phone])  on 08/03/2021 4:13:26 AM  Radiology DG Chest 2 View  Result Date: 08/03/2021 CLINICAL DATA:  Chest pain for 4 days. EXAM: CHEST - 2 VIEW COMPARISON:  PA Lat 09/05/2019. FINDINGS: The heart size and mediastinal contours are within normal limits. Both lungs are clear. The visualized skeletal structures are  unremarkable apart from slight chronic lower thoracic dextroscoliosis. IMPRESSION: No active cardiopulmonary disease.  Stable chest. Electronically Signed   By: Telford Nab M.D.   On: 08/03/2021 05:21    Procedures Procedures   Medications Ordered in ED Medications  ketorolac (TORADOL) 15 MG/ML injection 15 mg (15 mg Intravenous Given 08/03/21 0548)   ED Course/ Medical Decision Making/ A&P                           Medical Decision Making Amount and/or Complexity of Data Reviewed Labs: ordered. Radiology: ordered.  Risk Prescription drug management.  Pt is a 36 y.o. female who presents to the emergency department due to chest pain.  Labs: CBC with a hemoglobin 11.3, MCV of 79.6, MCH of 24, platelets of 427. BMP with a glucose of 100 and a calcium of 8.8. I-STAT beta-hCG less than 5. Troponin of 2.  Imaging: Chest x-ray shows no active cardiopulmonary disease. CTA of the chest is pending.  I, Rayna Sexton, PA-C, personally reviewed and evaluated these images and lab results as part of my medical decision-making.  On my exam patient has reproducible anterior chest wall pain.  She notes that she has had the symptoms intermittently in the past and they began once again about 4 days ago.  States that her symptoms have been waxing and waning and improve briefly when taking 800 to 1000 mg of ibuprofen.  She states she woke around 2 AM with severe 10/10 central chest pain as well as shortness of breath.    Patient states that since arriving to the emergency department her symptoms have improved mildly and her current pain is about 5/10.  Her lab work appears generally  reassuring.  Hemoglobin appears stable at 11.3 and platelets of 427.  Calcium of 8.8 but otherwise no electrolyte derangements.  Normal kidney function.  Troponin of 2.  Reassuring ECG and chest x-ray.  Given the severity of the patient's pain as well as her complex medical history I obtained a CTA of the chest which is pending.  It is the end of my shift and patient care is being transferred to [facility].  Patient pending CTA of the chest.  Disposition pending based on these results.  Note: Portions of this report may have been transcribed using voice recognition software. Every effort was made to ensure accuracy; however, inadvertent computerized transcription errors may be present.   Final Clinical Impression(s) / ED Diagnoses Final diagnoses:  Chest pain, unspecified type   Rx / DC Orders ED Discharge Orders     None         Rayna Sexton, PA-C 08/03/21 0625    Rayna Sexton, PA-C 08/03/21 0630    Quintella Reichert, MD 08/03/21 [phone]

## 2021-08-03 NOTE — Discharge Instructions (Addendum)
It was a pleasure taking care of you today!   Your workup was negative in the ED. You may take over the counter 600 or 800 mg ibuprofen every 6 hours as needed for pain. Follow up with your primary care provider as needed.  Call your rheumatologist to set up appointment regarding today's ED visit.  Return to the ED if you are experiencing increasing/worsening chest pain, trouble breathing, or worsening symptoms.

## 2022-01-08 ENCOUNTER — Ambulatory Visit (INDEPENDENT_AMBULATORY_CARE_PROVIDER_SITE_OTHER): Payer: 59

## 2022-01-08 ENCOUNTER — Ambulatory Visit (HOSPITAL_COMMUNITY)
Admission: EM | Admit: 2022-01-08 | Discharge: 2022-01-08 | Disposition: A | Discharge status: Home / Self Care | Payer: 59 | Attending: Family Medicine | Admitting: Family Medicine

## 2022-01-08 ENCOUNTER — Encounter (HOSPITAL_COMMUNITY): Payer: Self-pay

## 2022-01-08 DIAGNOSIS — L819 Disorder of pigmentation, unspecified: Secondary | ICD-10-CM | POA: Diagnosis not present

## 2022-01-08 DIAGNOSIS — R0602 Shortness of breath: Secondary | ICD-10-CM

## 2022-01-08 NOTE — Discharge Instructions (Addendum)
Your chest x-ray is normal  Please talk to your rheumatology office today about this new symptom, so they can decide what other evaluation you need done

## 2022-01-08 NOTE — ED Triage Notes (Addendum)
Patient noticed discoloration of her finger tips. They are all purple and Patient had labored breathing. Onset an hour ago. Patient has chronic auto-immune issues. Patient states fingers feel normal and has full range of motion in both hands. Some numbness in the hand but can feel all her fingers.   Patient had swelling in the right hand a few days ago but has resolved.

## 2022-01-08 NOTE — ED Provider Notes (Signed)
[facility]    CSN: [id number] Arrival date & time: 01/08/22  1244      History   Chief Complaint Chief Complaint  Patient presents with   Hand Problem    Purple tips on all her fingers.     HPI Elizabeth Burke is a 36 y.o. female.   HPI Here for purple discoloration to her fingernails.  She first noticed it today and also felt short of breath and dizzy when it first was noticed.  Shortness of breath has improved as has the dizziness.  She was at work at the [employer] when this was first noted.  Her toenails do not have the same look, though her feet and toes feel a little tingly now.  Her fingers are not painful at all.  They are not pale.  No fever  She has history of ankylosing spondylitis and chronic autoimmune disease.  She gets Remicade every 2 months and takes Plaquenil.  Past Medical History:  Diagnosis Date   Allergy    Seasonal   Arthritis    Spondyloarthropathy--followed by Dr Dierdre Forth   Death of child    tearful today , reports 64 mo old son succumbed to SIDS on 12-29-17   GERD (gastroesophageal reflux disease) 2000 or so   H/O hiatal hernia    Headache(784.0)    Neuromuscular disorder (HCC)    Pins and needles in right arm following surgery for hidradenitis.   Orthostatic hypotension 09/20/2017   Scoliosis [id number]   Syncope 09/20/2017   Urinary tract infection     Patient Active Problem List   Diagnosis Date Noted   Hidradenitis axillaris 01/25/2018   Preterm premature rupture of membranes (PPROM) with onset of labor within 24 hours of rupture in third trimester, antepartum 10/09/2017   Syncope 09/20/2017   Orthostatic hypotension 09/20/2017   Post-operative hemorrhage 07/31/2013   Hidradenitis suppurativa 07/06/2013    Past Surgical History:  Procedure Laterality Date   APPLICATION OF WOUND VAC Left 01/25/2018   Procedure: APPLICATION OF WOUND VAC;  Surgeon: Abigail Miyamoto, MD;  Location: [facility];  Service:  General;  Laterality: Left;   CHOLECYSTECTOMY N/A 04/14/2021   Procedure: LAPAROSCOPIC CHOLECYSTECTOMY WITH IOC;  Surgeon: Luretha Murphy, MD;  Location: [facility];  Service: General;  Laterality: N/A;   FRACTURE SURGERY Left 2000   Left wrist--casted only, no surgery   HYDRADENITIS EXCISION Bilateral 07/31/2013   Procedure: EXCISION HYDRADENITIS RIGHT AXILLA  AND LEFT GROIN ;  Surgeon: Shelly Rubenstein, MD;  Location: [facility];  Service: General;  Laterality: Bilateral;   HYDRADENITIS EXCISION Left 12/05/2017   Procedure: WIDE EXCISION HIDRADENITIS LEFT AXILLA ERAS PATHWAY;  Surgeon: Abigail Miyamoto, MD;  Location: [facility];  Service: General;  Laterality: Left;   HYDRADENITIS EXCISION Left 01/25/2018   Procedure: LEFT AXILLARY WOUND EXPLORATION ERAS PATHWAY;  Surgeon: Abigail Miyamoto, MD;  Location: [facility];  Service: General;  Laterality: Left;   LAPAROSCOPIC TUBAL LIGATION Bilateral 12/08/2017   Procedure: LAPAROSCOPIC TUBAL LIGATION with Filshie clips;  Surgeon: Richarda Overlie, MD;  Location: [facility];  Service: Gynecology;  Laterality: Bilateral;   NOSE SURGERY N/A 2012   removal of fish hook     OB History     Gravida  3   Para  2   Term  1   Preterm  1   AB  1   Living  2      SAB  1   IAB  Ectopic      Multiple  0   Live Births  2            Home Medications    Prior to Admission medications   Medication Sig Start Date End Date Taking? Authorizing Provider  inFLIXimab (REMICADE) 100 MG injection Inject 100 mg into the vein every 8 (eight) weeks.   Yes [provider]  HYDROcodone-acetaminophen (NORCO/VICODIN) 5-325 MG tablet Take 1 tablet by mouth every 6 (six) hours as needed for moderate pain. 04/14/21   Luretha Murphy, MD  hydroxychloroquine (PLAQUENIL) 200 MG tablet Take 200 mg by mouth 2 (two) times daily. 04/05/21   [provider]  hydrOXYzine (ATARAX/VISTARIL) 25 MG tablet Take 1 tablet (25 mg total) by mouth  every 6 (six) hours as needed for itching. Patient not taking: Reported on 04/08/2021 09/01/20   Wieters, Hallie C, PA-C  ibuprofen (ADVIL) 800 MG tablet Take 1 tablet (800 mg total) by mouth 3 (three) times daily. 06/05/20   Wieters, Hallie C, PA-C  Norethindrone-Ethinyl Estradiol-Fe Biphas (LO LOESTRIN FE) 1 MG-10 MCG / 10 MCG tablet Take 1 tablet by mouth daily.    [provider]    Family History Family History  Problem Relation Age of Onset   Cancer Maternal Grandmother        bladder and liver   Diabetes Maternal Grandfather    Cancer Maternal Grandfather        prostate   Diabetes Paternal Grandmother    Hypertension Mother    Sleep apnea Father    Ovarian cysts Sister        polycystic ovarian disease   Arthritis Paternal Grandfather        unknown joint problem    Social History Social History   Tobacco Use   Smoking status: Former    Packs/day: 0.00    Years: 0.00    Total pack years: 0.00    Types: Cigarettes    Start date: 07/05/2004   Smokeless tobacco: Never  Vaping Use   Vaping Use: Never used  Substance Use Topics   Alcohol use: No    Alcohol/week: 0.0 standard drinks of alcohol   Drug use: Not Currently    Types: Marijuana     Allergies   Patient has no known allergies.   Review of Systems Review of Systems   Physical Exam Triage Vital Signs ED Triage Vitals  Enc Vitals Group     BP 01/08/22 1305 (!) 150/89     Pulse Rate 01/08/22 1305 78     Resp 01/08/22 1305 16     Temp 01/08/22 1305 98.1 F (36.7 C)     Temp Source 01/08/22 1305 Oral     SpO2 01/08/22 1305 99 %     Weight 01/08/22 1308 183 lb (83 kg)     Height 01/08/22 1308 5\' 7"  (1.702 m)     Head Circumference --      Peak Flow --      Pain Score 01/08/22 1308 0     Pain Loc --      Pain Edu? --      Excl. in GC? --    No data found.  Updated Vital Signs BP (!) 150/89 (BP Location: Left Arm)   Pulse 78   Temp 98.1 F (36.7 C) (Oral)   Resp 16   Ht 5\' 7"   (1.702 m)   Wt 83 kg   LMP 12/31/2021 (Exact Date)   SpO2 99%  BMI 28.66 kg/m   Visual Acuity Right Eye Distance:   Left Eye Distance:   Bilateral Distance:    Right Eye Near:   Left Eye Near:    Bilateral Near:     Physical Exam Vitals reviewed.  Constitutional:      General: She is not in acute distress.    Appearance: She is not ill-appearing, toxic-appearing or diaphoretic.     Comments: O2 sat on room air is normal at 99%  HENT:     Mouth/Throat:     Mouth: Mucous membranes are moist.     Comments: Mucous membranes are moist and pink.  No cyanosis of her oral mucous membrane Eyes:     Extraocular Movements: Extraocular movements intact.     Conjunctiva/sclera: Conjunctivae normal.     Pupils: Pupils are equal, round, and reactive to light.  Cardiovascular:     Rate and Rhythm: Normal rate and regular rhythm.     Heart sounds: No murmur heard. Pulmonary:     Effort: Pulmonary effort is normal.     Breath sounds: Normal breath sounds. No stridor. No wheezing, rhonchi or rales.  Musculoskeletal:     Cervical back: Neck supple.  Lymphadenopathy:     Cervical: No cervical adenopathy.  Skin:    Coloration: Skin is not jaundiced or pale.     Comments: All her fingernails have a purplish discoloration to the nailbed; also her finger pads are slightly purplish  Neurological:     Mental Status: She is alert and oriented to person, place, and time.      UC Treatments / Results  Labs (all labs ordered are listed, but only abnormal results are displayed) Labs Reviewed - No data to display  EKG   Radiology DG Chest 2 View  Result Date: 01/08/2022 CLINICAL DATA:  Shortness of breath, former smoker EXAM: CHEST - 2 VIEW COMPARISON:  Chest radiograph 08/03/2021 FINDINGS: The cardiomediastinal contours are within normal limits. The lungs are clear. No pneumothorax or pleural effusion. No acute finding in the visualized skeleton. IMPRESSION: No active cardiopulmonary  disease. Electronically Signed   By: Emmaline Kluver M.D.   On: 01/08/2022 13:53    Procedures Procedures (including critical care time)  Medications Ordered in UC Medications - No data to display  Initial Impression / Assessment and Plan / UC Course  I have reviewed the triage vital signs and the nursing notes.  Pertinent labs & imaging results that were available during my care of the patient were reviewed by me and considered in my medical decision making (see chart for details).     Her chest x-ray is normal.  I discussed with her that this is not typical for Raynaud's, since it is not really pale or painful.  Also it is persisting even when she is not in a cold environment.  Also I do not think it is cyanosis as her mucous membranes in her mouth are normal.  I have asked her to please talk to her rheumatologist about the symptoms so they can decide what else she needs to have done to evaluate further Final Clinical Impressions(s) / UC Diagnoses   Final diagnoses:  Discoloration of skin     Discharge Instructions      Your chest x-ray is normal  Please talk to your rheumatology office today about this new symptom, so they can decide what other evaluation you need done      ED Prescriptions   None    PDMP not  reviewed this encounter.   Zenia Resides, MD 01/08/22 1359

## 2022-01-15 ENCOUNTER — Other Ambulatory Visit: Payer: Self-pay | Admitting: Obstetrics and Gynecology

## 2022-01-15 DIAGNOSIS — R1031 Right lower quadrant pain: Secondary | ICD-10-CM

## 2022-01-21 ENCOUNTER — Ambulatory Visit
Admission: RE | Admit: 2022-01-21 | Discharge: 2022-01-21 | Disposition: A | Discharge status: Home / Self Care | Payer: 59 | Source: Ambulatory Visit | Attending: Obstetrics and Gynecology | Admitting: Obstetrics and Gynecology

## 2022-01-21 DIAGNOSIS — R1031 Right lower quadrant pain: Secondary | ICD-10-CM

## 2022-01-21 MED ORDER — IOPAMIDOL (ISOVUE-300) INJECTION 61%
100.0000 mL | Freq: Once | INTRAVENOUS | Status: AC | PRN
  Administered 2022-01-21: 100 mL via INTRAVENOUS

## 2022-02-15 ENCOUNTER — Other Ambulatory Visit: Payer: 59

## 2022-07-07 IMAGING — DX DG CHEST 2V
2 series · 2 of 2 positions shown · non-contrast
Comparison: PA Lat 09/05/2019.

CLINICAL DATA: Chest pain for 4 days.

EXAM:
CHEST - 2 VIEW

[chest pa]
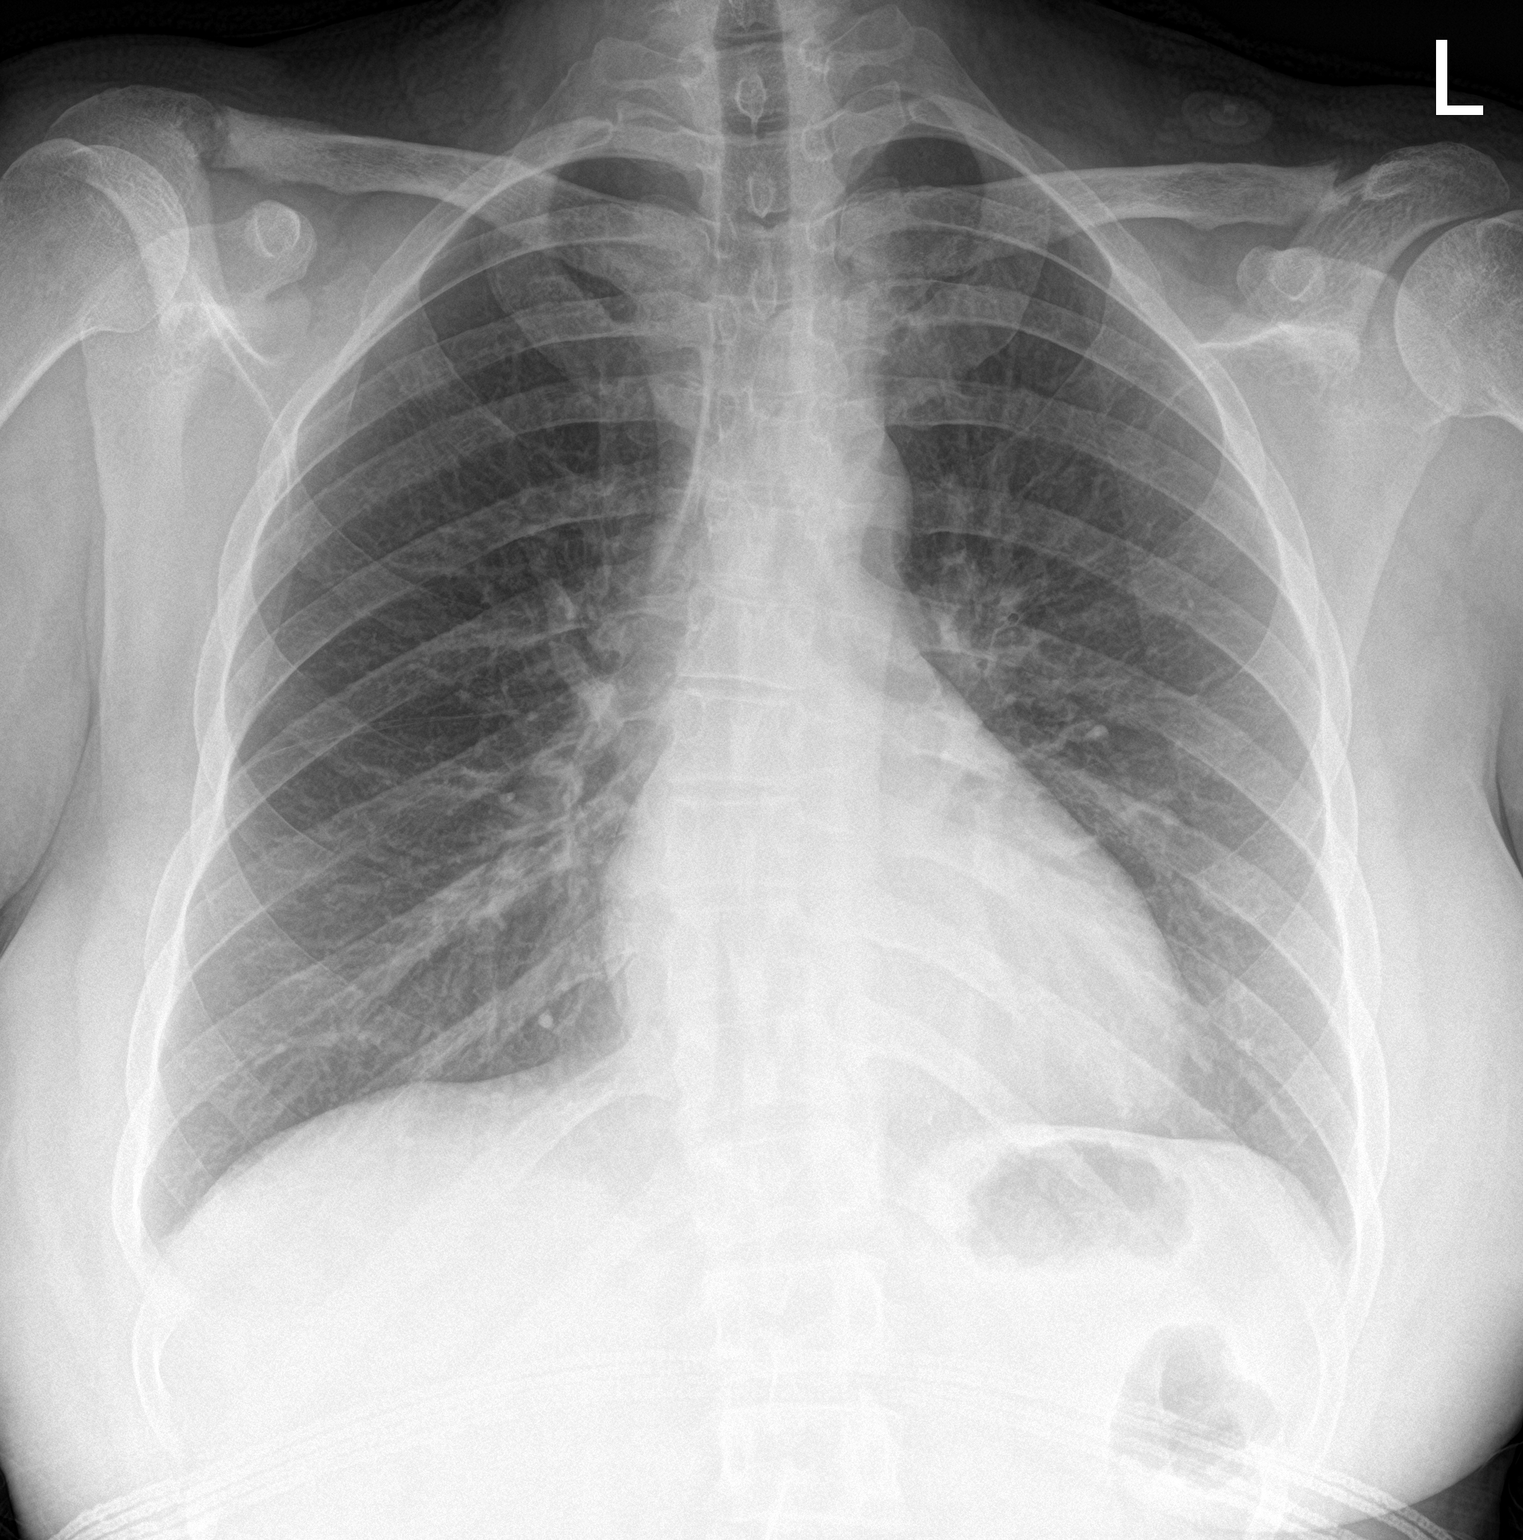

[chest lat]
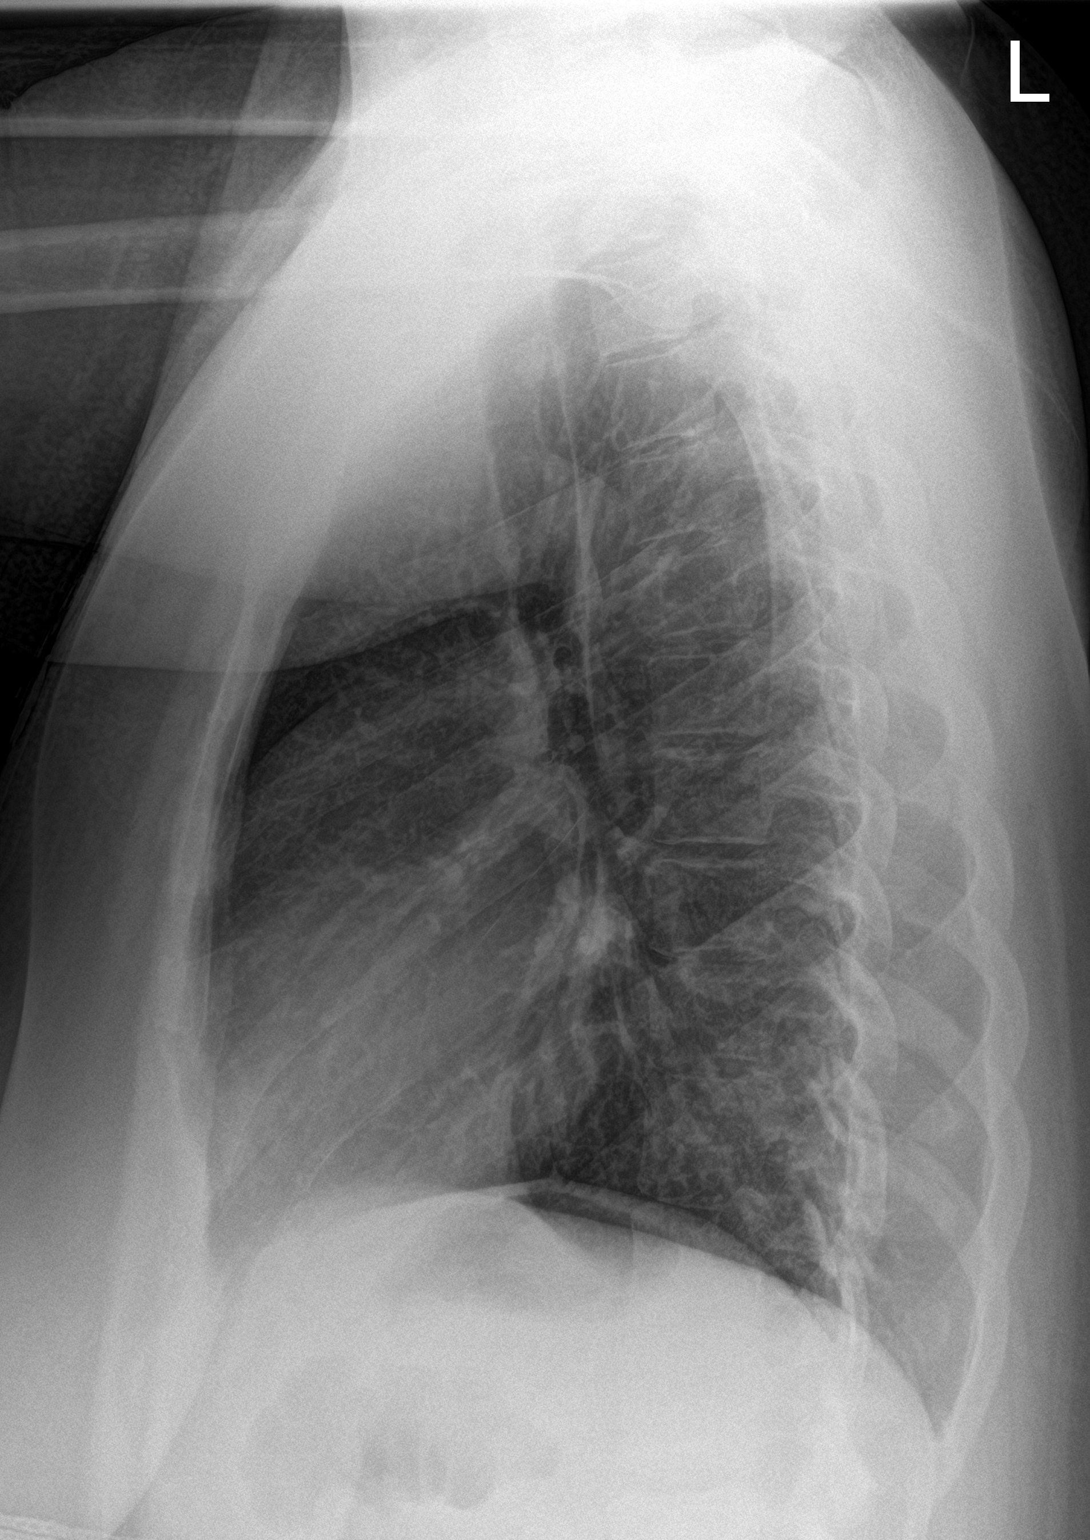

[2 of 2 positions shown; findings below may reference images not displayed]

FINDINGS: The heart size and mediastinal contours are within normal limits.
Both lungs are clear. The visualized skeletal structures are
unremarkable apart from slight chronic lower thoracic
dextroscoliosis.
IMPRESSION: No active cardiopulmonary disease.  Stable chest.

## 2022-07-07 IMAGING — CT CT ANGIO CHEST
2 of 6 series · 18 of 46 positions shown · IV contrast (agent unspecified)
Comparison: Chest XR, concurrent.

CLINICAL DATA: Pulmonary embolism (PE) suspected, high prob

EXAM:
CT ANGIOGRAPHY CHEST WITH CONTRAST
TECHNIQUE: Multidetector CT imaging of the chest was performed using the
standard protocol during bolus administration of intravenous
contrast. Multiplanar CT image reconstructions and MIPs were
obtained to evaluate the vascular anatomy.

[Series 6: thins · axial · 0.74mm/px · z∈[+1072,+1315]mm · 15 of 267 slices shown]
[im 12/267  lung]
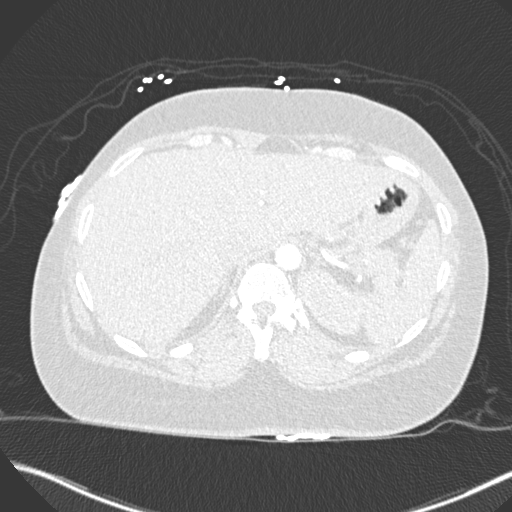
[im 35/267  soft-tissue]
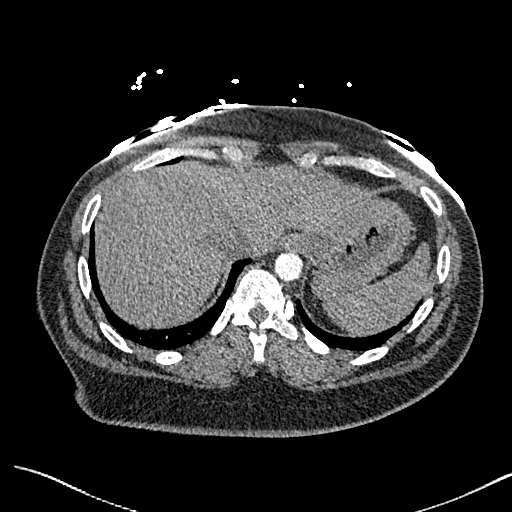
[im 47/267  lung]
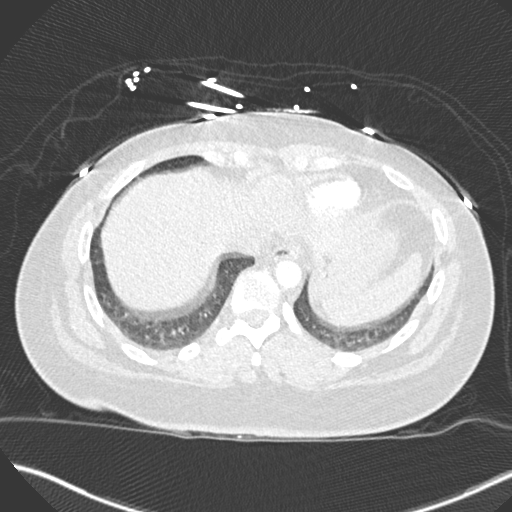
[im 70/267  soft-tissue]
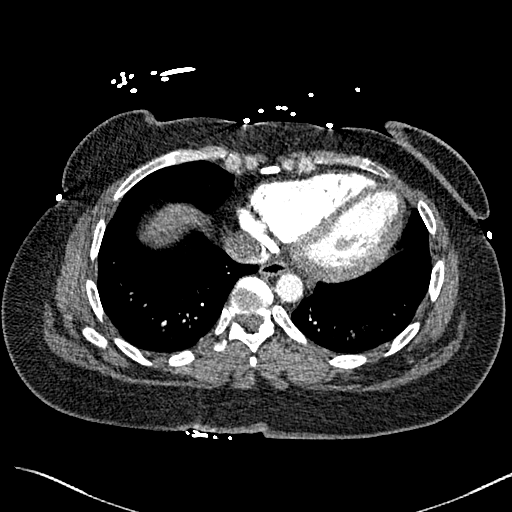
[im 81/267  lung]
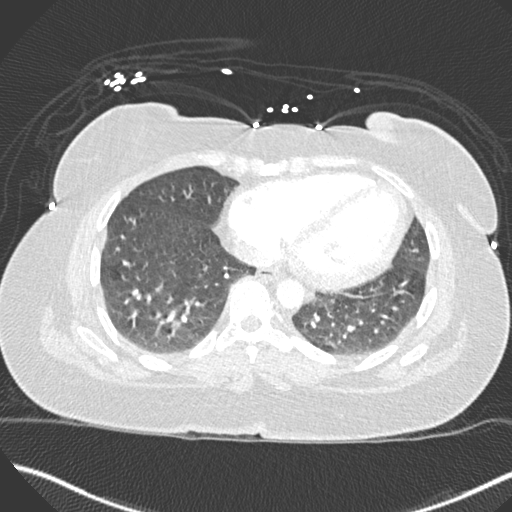
[im 105/267  soft-tissue]
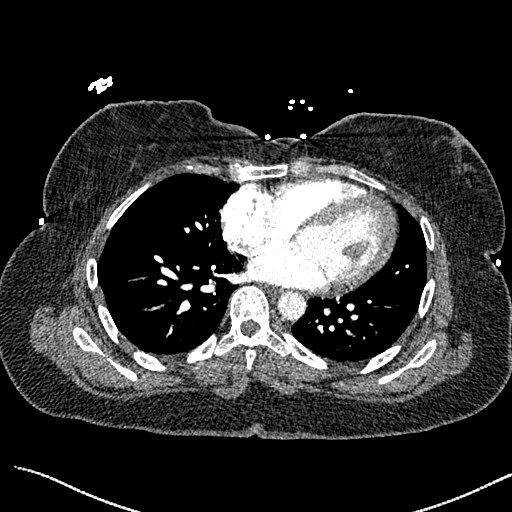
[im 116/267  lung]
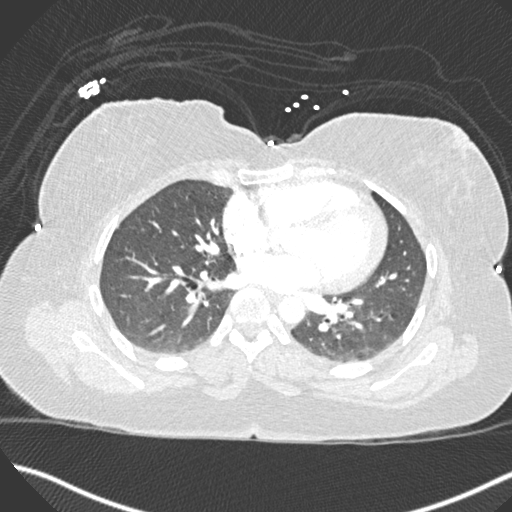
[im 139/267  soft-tissue]
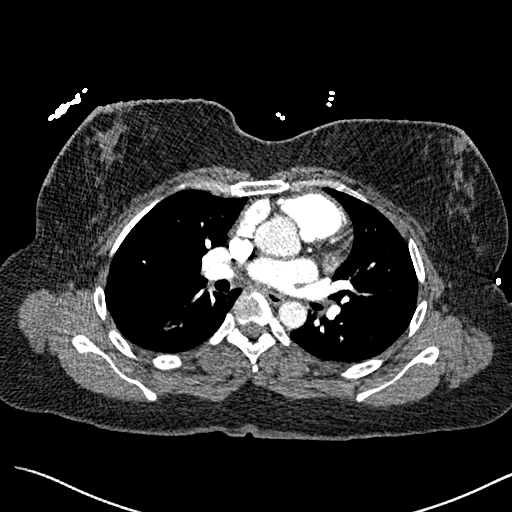
[im 151/267  lung]
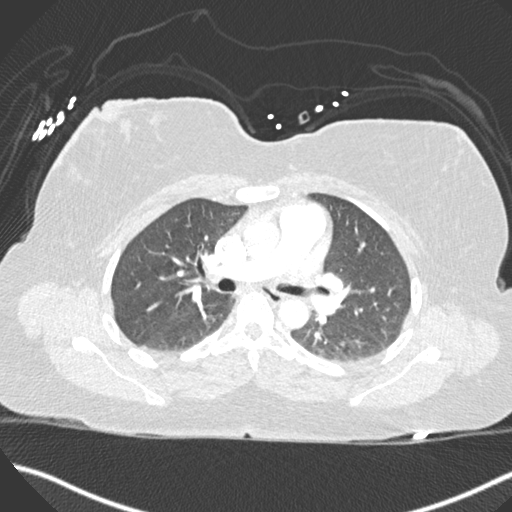
[im 162/267  soft-tissue]
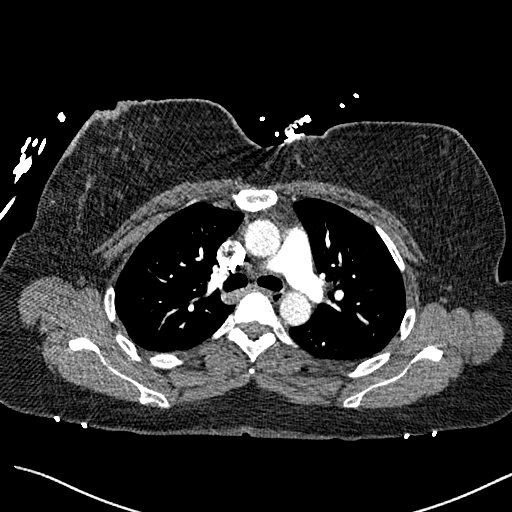
[im 186/267  lung]
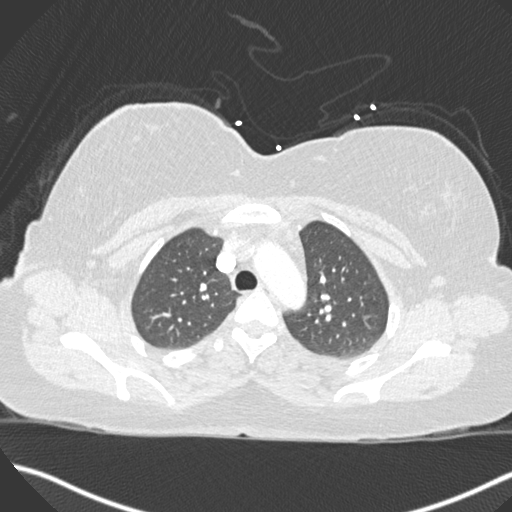
[im 197/267  soft-tissue]
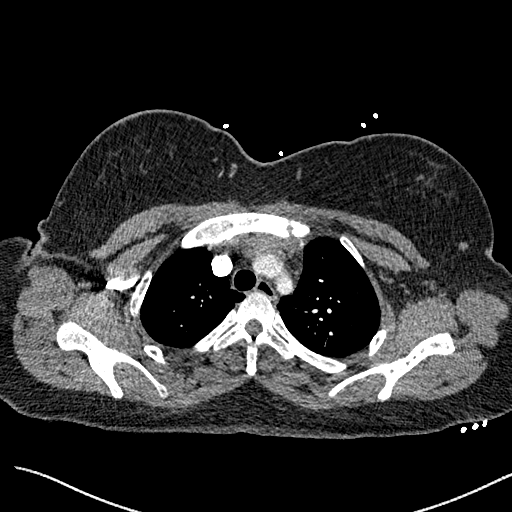
[im 220/267  lung]
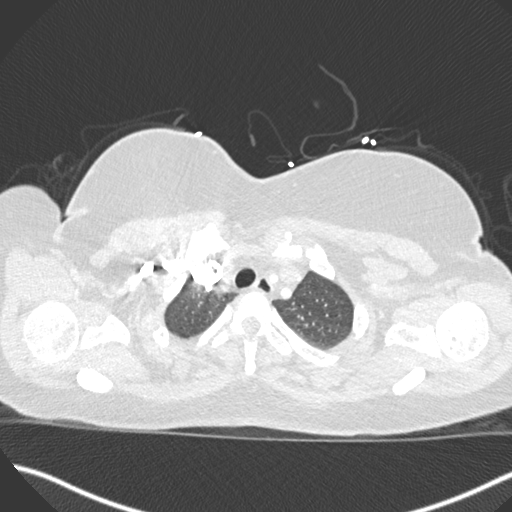
[im 232/267  soft-tissue]
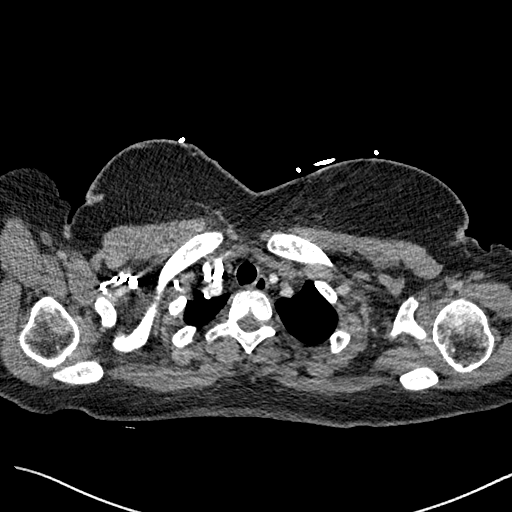
[im 255/267  lung]
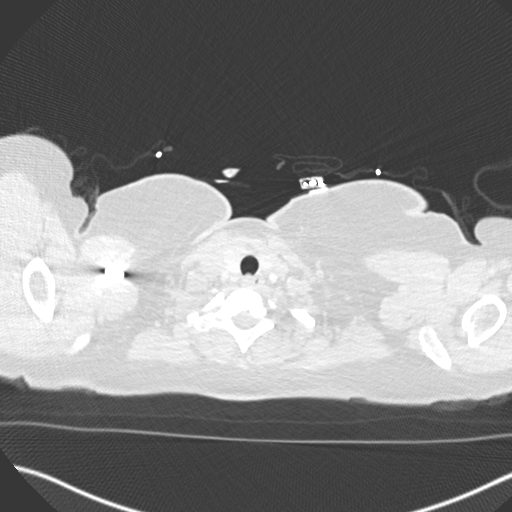

[Series 8: coronal mpr · coronal · 0.51mm/px · 3 of 150 slices shown]
[im 38/150  soft-tissue]
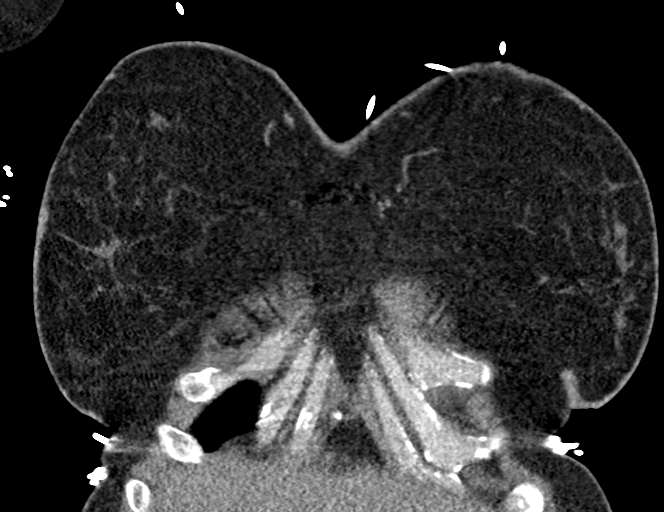
[im 75/150  soft-tissue]
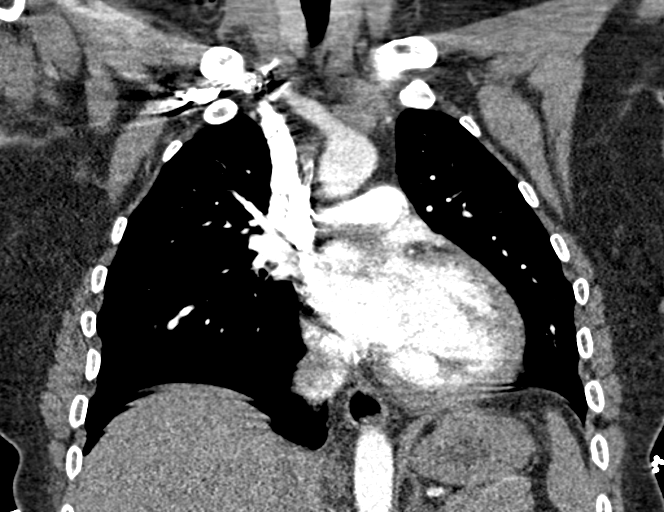
[im 112/150  soft-tissue]
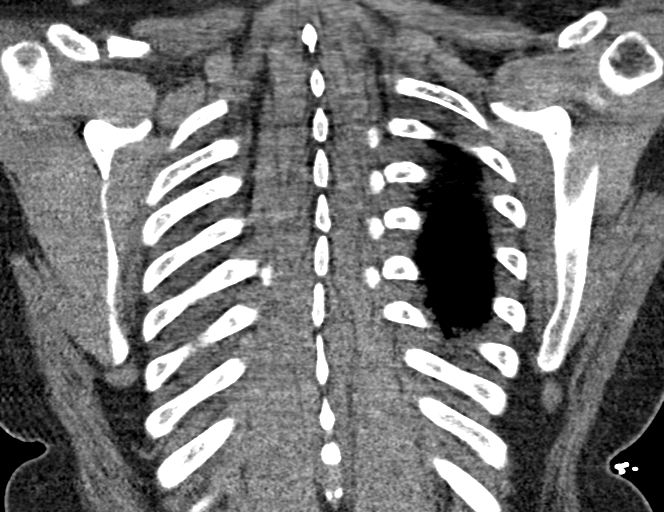

[18 of 46 positions shown; findings below may reference images not displayed]

RADIATION DOSE REDUCTION: This exam was performed according to the
departmental dose-optimization program which includes automated
exposure control, adjustment of the mA and/or kV according to
patient size and/or use of iterative reconstruction technique.

CONTRAST:  65mL OMNIPAQUE IOHEXOL 350 MG/ML SOLN
FINDINGS: Suboptimal evaluation, secondary to poor contrast opacification such
that subsegmental emboli could missed.

Cardiovascular: Satisfactory opacification of the pulmonary arteries
to the central pulmonary arterial level. No segmental or larger
pulmonary embolus. Normal heart size. No pericardial effusion.

Mediastinum/Nodes: No enlarged mediastinal, hilar, or axillary lymph
nodes. Thyroid gland, trachea, and esophagus demonstrate no
significant findings.

Lungs/Pleura: Trace bibasilar atelectasis. Lungs are otherwise
clear. No pleural effusion or pneumothorax.

Upper Abdomen: No acute abnormality.

Musculoskeletal: No chest wall abnormality. No acute or significant
osseous findings.

Review of the MIP images confirms the above findings.
IMPRESSION: Suboptimal evaluation, within these constraints;

1. No segmental or larger pulmonary embolus.
2. No acute intrathoracic findings.

## 2023-03-29 ENCOUNTER — Ambulatory Visit (HOSPITAL_COMMUNITY)
Admission: EM | Admit: 2023-03-29 | Discharge: 2023-03-29 | Disposition: A | Discharge status: Home / Self Care | Payer: 59 | Attending: Family Medicine | Admitting: Family Medicine

## 2023-03-29 ENCOUNTER — Encounter (HOSPITAL_COMMUNITY): Payer: Self-pay | Admitting: Emergency Medicine

## 2023-03-29 DIAGNOSIS — N12 Tubulo-interstitial nephritis, not specified as acute or chronic: Secondary | ICD-10-CM | POA: Diagnosis not present

## 2023-03-29 LAB — POCT URINALYSIS DIP (MANUAL ENTRY)
Bilirubin, UA: NEGATIVE
Glucose, UA: NEGATIVE mg/dL
Ketones, POC UA: NEGATIVE mg/dL
Nitrite, UA: NEGATIVE
Protein Ur, POC: NEGATIVE mg/dL
Spec Grav, UA: 1.025 (ref 1.010–1.025)
Urobilinogen, UA: 0.2 E.U./dL
pH, UA: 5.5 (ref 5.0–8.0)

## 2023-03-29 MED ORDER — CEPHALEXIN 500 MG PO CAPS: 500.0000 mg | ORAL_CAPSULE | Freq: Three times a day (TID) | ORAL | 0 refills | Status: DC

## 2023-03-29 MED ORDER — CIPROFLOXACIN HCL 500 MG PO TABS: 500.0000 mg | ORAL_TABLET | Freq: Two times a day (BID) | ORAL | 0 refills | Status: AC

## 2023-03-29 NOTE — ED Triage Notes (Signed)
Pt reports having left flank pain, urinary frequency, and body aches for a couple days.

## 2023-03-29 NOTE — Discharge Instructions (Signed)
There were some white blood cells in your urine, which could be consistent with a urinary tract infection.  Your symptoms make me concerned that you are having a kidney infection.  Take Cipro 500 mg--1 tablet 2 times daily for 7 days  Drink plenty of fluids.

## 2023-03-29 NOTE — ED Provider Notes (Signed)
[facility]    CSN: [id number] Arrival date & time: 03/29/23  1922      History   Chief Complaint No chief complaint on file.   HPI Elizabeth Burke is a 37 y.o. female.   HPI Here for left flank pain, suprapubic pain and pressure, and urinary frequency with the sensation of incomplete bladder emptying.   no fever or chills.  No vomiting or diarrhea   Last menstrual cycle was September 3. Past Medical History:  Diagnosis Date   Allergy    Seasonal   Arthritis    Spondyloarthropathy--followed by Dr Dierdre Forth   Death of child    tearful today , reports 48 mo old son succumbed to SIDS on 12-29-17   GERD (gastroesophageal reflux disease) 2000 or so   H/O hiatal hernia    Headache(784.0)    Neuromuscular disorder (HCC)    Pins and needles in right arm following surgery for hidradenitis.   Orthostatic hypotension 09/20/2017   Scoliosis [id number]   Syncope 09/20/2017   Urinary tract infection     Patient Active Problem List   Diagnosis Date Noted   Hidradenitis axillaris 01/25/2018   Preterm premature rupture of membranes (PPROM) with onset of labor within 24 hours of rupture in third trimester, antepartum 10/09/2017   Syncope 09/20/2017   Orthostatic hypotension 09/20/2017   Post-operative hemorrhage 07/31/2013   Hidradenitis suppurativa 07/06/2013    Past Surgical History:  Procedure Laterality Date   APPLICATION OF WOUND VAC Left 01/25/2018   Procedure: APPLICATION OF WOUND VAC;  Surgeon: Abigail Miyamoto, MD;  Location: [facility];  Service: General;  Laterality: Left;   CHOLECYSTECTOMY N/A 04/14/2021   Procedure: LAPAROSCOPIC CHOLECYSTECTOMY WITH IOC;  Surgeon: Luretha Murphy, MD;  Location: [facility];  Service: General;  Laterality: N/A;   FRACTURE SURGERY Left 2000   Left wrist--casted only, no surgery   HYDRADENITIS EXCISION Bilateral 07/31/2013   Procedure: EXCISION HYDRADENITIS RIGHT AXILLA  AND LEFT GROIN ;  Surgeon: Shelly Rubenstein, MD;  Location: [facility];  Service: General;  Laterality: Bilateral;   HYDRADENITIS EXCISION Left 12/05/2017   Procedure: WIDE EXCISION HIDRADENITIS LEFT AXILLA ERAS PATHWAY;  Surgeon: Abigail Miyamoto, MD;  Location: [facility];  Service: General;  Laterality: Left;   HYDRADENITIS EXCISION Left 01/25/2018   Procedure: LEFT AXILLARY WOUND EXPLORATION ERAS PATHWAY;  Surgeon: Abigail Miyamoto, MD;  Location: [facility];  Service: General;  Laterality: Left;   LAPAROSCOPIC TUBAL LIGATION Bilateral 12/08/2017   Procedure: LAPAROSCOPIC TUBAL LIGATION with Filshie clips;  Surgeon: Richarda Overlie, MD;  Location: [facility];  Service: Gynecology;  Laterality: Bilateral;   NOSE SURGERY N/A 2012   removal of fish hook     OB History     Gravida  3   Para  2   Term  1   Preterm  1   AB  1   Living  2      SAB  1   IAB      Ectopic      Multiple  0   Live Births  2            Home Medications    Prior to Admission medications   Medication Sig Start Date End Date Taking? Authorizing Provider  ciprofloxacin (CIPRO) 500 MG tablet Take 1 tablet (500 mg total) by mouth 2 (two) times daily for 7 days. 03/29/23 04/05/23 Yes Zenia Resides, MD  HYDROcodone-acetaminophen (NORCO/VICODIN) 5-325 MG tablet Take 1 tablet by mouth  every 6 (six) hours as needed for moderate pain. 04/14/21   Luretha Murphy, MD  hydroxychloroquine (PLAQUENIL) 200 MG tablet Take 200 mg by mouth 2 (two) times daily. 04/05/21   [provider]  hydrOXYzine (ATARAX/VISTARIL) 25 MG tablet Take 1 tablet (25 mg total) by mouth every 6 (six) hours as needed for itching. Patient not taking: Reported on 04/08/2021 09/01/20   Wieters, Hallie C, PA-C  inFLIXimab (REMICADE) 100 MG injection Inject 100 mg into the vein every 8 (eight) weeks.    [provider]  Norethindrone-Ethinyl Estradiol-Fe Biphas (LO LOESTRIN FE) 1 MG-10 MCG / 10 MCG tablet Take 1 tablet by mouth daily.    [provider]    Family  History Family History  Problem Relation Age of Onset   Cancer Maternal Grandmother        bladder and liver   Diabetes Maternal Grandfather    Cancer Maternal Grandfather        prostate   Diabetes Paternal Grandmother    Hypertension Mother    Sleep apnea Father    Ovarian cysts Sister        polycystic ovarian disease   Arthritis Paternal Grandfather        unknown joint problem    Social History Social History   Tobacco Use   Smoking status: Former    Types: Cigarettes    Start date: 07/05/2004   Smokeless tobacco: Never  Vaping Use   Vaping status: Never Used  Substance Use Topics   Alcohol use: No    Alcohol/week: 0.0 standard drinks of alcohol   Drug use: Not Currently    Types: Marijuana     Allergies   Patient has no known allergies.   Review of Systems Review of Systems   Physical Exam Triage Vital Signs ED Triage Vitals  Encounter Vitals Group     BP 03/29/23 1949 109/75     Systolic BP Percentile --      Diastolic BP Percentile --      Pulse Rate 03/29/23 1949 85     Resp 03/29/23 1949 17     Temp 03/29/23 1949 97.9 F (36.6 C)     Temp Source 03/29/23 1949 Oral     SpO2 03/29/23 1949 97 %     Weight --      Height --      Head Circumference --      Peak Flow --      Pain Score 03/29/23 1948 7     Pain Loc --      Pain Education --      Exclude from Growth Chart --    No data found.  Updated Vital Signs BP 109/75 (BP Location: Left Arm)   Pulse 85   Temp 97.9 F (36.6 C) (Oral)   Resp 17   LMP 03/08/2023   SpO2 97%   Visual Acuity Right Eye Distance:   Left Eye Distance:   Bilateral Distance:    Right Eye Near:   Left Eye Near:    Bilateral Near:     Physical Exam Vitals reviewed.  Constitutional:      General: She is not in acute distress.    Appearance: She is not ill-appearing, toxic-appearing or diaphoretic.  HENT:     Nose: Nose normal.     Mouth/Throat:     Mouth: Mucous membranes are moist.     Pharynx: No  oropharyngeal exudate or posterior oropharyngeal erythema.  Eyes:  Extraocular Movements: Extraocular movements intact.     Conjunctiva/sclera: Conjunctivae normal.     Pupils: Pupils are equal, round, and reactive to light.  Cardiovascular:     Rate and Rhythm: Normal rate and regular rhythm.  Pulmonary:     Effort: Pulmonary effort is normal.     Breath sounds: Normal breath sounds.  Abdominal:     Tenderness: There is abdominal tenderness (suprapubic). There is no right CVA tenderness or left CVA tenderness.  Musculoskeletal:     Cervical back: Neck supple.  Lymphadenopathy:     Cervical: No cervical adenopathy.  Skin:    Coloration: Skin is not pale.  Neurological:     Mental Status: She is alert and oriented to person, place, and time.      UC Treatments / Results  Labs (all labs ordered are listed, but only abnormal results are displayed) Labs Reviewed  POCT URINALYSIS DIP (MANUAL ENTRY) - Abnormal; Notable for the following components:      Result Value   Blood, UA small (*)    Leukocytes, UA Small (1+) (*)    All other components within normal limits  URINE CULTURE    EKG   Radiology No results found.  Procedures Procedures (including critical care time)  Medications Ordered in UC Medications - No data to display  Initial Impression / Assessment and Plan / UC Course  I have reviewed the triage vital signs and the nursing notes.  Pertinent labs & imaging results that were available during my care of the patient were reviewed by me and considered in my medical decision making (see chart for details).        There is a small amount of red blood cells and white blood cells on the urinalysis.  Urine culture is sent.  I am concerned her symptoms are indicative of a pyelonephritis.  Cipro was sent in to treat. Final Clinical Impressions(s) / UC Diagnoses   Final diagnoses:  Pyelonephritis     Discharge Instructions      There were some white  blood cells in your urine, which could be consistent with a urinary tract infection.  Your symptoms make me concerned that you are having a kidney infection.  Take Cipro 500 mg--1 tablet 2 times daily for 7 days  Drink plenty of fluids.     ED Prescriptions     Medication Sig Dispense Auth. Provider   cephALEXin (KEFLEX) 500 MG capsule  (Status: Discontinued) Take 1 capsule (500 mg total) by mouth 3 (three) times daily for 7 days. 21 capsule Zenia Resides, MD   ciprofloxacin (CIPRO) 500 MG tablet Take 1 tablet (500 mg total) by mouth 2 (two) times daily for 7 days. 14 tablet Bhavya Grand, Janace Aris, MD      PDMP not reviewed this encounter.   Zenia Resides, MD 03/29/23 2026

## 2023-03-31 LAB — URINE CULTURE

## 2023-05-06 DEATH — deceased
